# Patient Record
Sex: Female | Born: 1935
Health system: Southern US, Community
[De-identification: ages and names within clinical notes are randomized; demographics above are authoritative.]

## PROBLEM LIST (undated history)

## (undated) DIAGNOSIS — K5790 Diverticulosis of intestine, part unspecified, without perforation or abscess without bleeding: Secondary | ICD-10-CM

## (undated) DIAGNOSIS — B029 Zoster without complications: Secondary | ICD-10-CM

## (undated) DIAGNOSIS — I7300 Raynaud's syndrome without gangrene: Secondary | ICD-10-CM

## (undated) DIAGNOSIS — E7800 Pure hypercholesterolemia, unspecified: Secondary | ICD-10-CM

## (undated) DIAGNOSIS — Z9289 Personal history of other medical treatment: Secondary | ICD-10-CM

## (undated) DIAGNOSIS — M1712 Unilateral primary osteoarthritis, left knee: Secondary | ICD-10-CM

## (undated) DIAGNOSIS — Z862 Personal history of diseases of the blood and blood-forming organs and certain disorders involving the immune mechanism: Secondary | ICD-10-CM

## (undated) DIAGNOSIS — I7121 Aneurysm of the ascending aorta, without rupture: Secondary | ICD-10-CM

## (undated) DIAGNOSIS — E039 Hypothyroidism, unspecified: Secondary | ICD-10-CM

## (undated) DIAGNOSIS — I2699 Other pulmonary embolism without acute cor pulmonale: Secondary | ICD-10-CM

## (undated) DIAGNOSIS — I480 Paroxysmal atrial fibrillation: Secondary | ICD-10-CM

## (undated) DIAGNOSIS — I2510 Atherosclerotic heart disease of native coronary artery without angina pectoris: Secondary | ICD-10-CM

## (undated) DIAGNOSIS — I429 Cardiomyopathy, unspecified: Secondary | ICD-10-CM

## (undated) DIAGNOSIS — K589 Irritable bowel syndrome without diarrhea: Secondary | ICD-10-CM

## (undated) DIAGNOSIS — I82409 Acute embolism and thrombosis of unspecified deep veins of unspecified lower extremity: Secondary | ICD-10-CM

## (undated) DIAGNOSIS — I712 Thoracic aortic aneurysm, without rupture: Secondary | ICD-10-CM

## (undated) HISTORY — PX: JOINT REPLACEMENT: SHX530

## (undated) HISTORY — PX: KNEE ARTHROSCOPY: SUR90

## (undated) HISTORY — DX: Other pulmonary embolism without acute cor pulmonale: I26.99

## (undated) HISTORY — DX: Irritable bowel syndrome, unspecified: K58.9

## (undated) HISTORY — DX: Zoster without complications: B02.9

## (undated) HISTORY — DX: Unilateral primary osteoarthritis, left knee: M17.12

## (undated) HISTORY — DX: Personal history of other medical treatment: Z92.89

## (undated) HISTORY — DX: Atherosclerotic heart disease of native coronary artery without angina pectoris: I25.10

## (undated) HISTORY — DX: Cardiomyopathy, unspecified: I42.9

## (undated) HISTORY — PX: OTHER SURGICAL HISTORY: SHX169

## (undated) HISTORY — DX: Raynaud's syndrome without gangrene: I73.00

## (undated) HISTORY — DX: Paroxysmal atrial fibrillation: I48.0

## (undated) HISTORY — DX: Thoracic aortic aneurysm, without rupture: I71.2

## (undated) HISTORY — DX: Hypothyroidism, unspecified: E03.9

## (undated) HISTORY — DX: Aneurysm of the ascending aorta, without rupture: I71.21

## (undated) HISTORY — DX: Pure hypercholesterolemia, unspecified: E78.00

## (undated) HISTORY — DX: Diverticulosis of intestine, part unspecified, without perforation or abscess without bleeding: K57.90

## (undated) HISTORY — PX: APPENDECTOMY: SHX54

---

## 1959-01-19 HISTORY — PX: TONSILLECTOMY: SUR1361

## 1971-01-19 HISTORY — PX: ABDOMINAL HYSTERECTOMY: SHX81

## 1998-01-18 HISTORY — PX: SHOULDER ADHESION RELEASE: SHX773

## 2000-05-26 ENCOUNTER — Other Ambulatory Visit: Admission: RE | Admit: 2000-05-26 | Discharge: 2000-05-26 | Payer: Self-pay | Admitting: Gynecology

## 2002-11-28 ENCOUNTER — Ambulatory Visit (HOSPITAL_COMMUNITY): Admission: RE | Admit: 2002-11-28 | Discharge: 2002-11-28 | Payer: Self-pay | Admitting: Gastroenterology

## 2004-11-26 ENCOUNTER — Other Ambulatory Visit: Admission: RE | Admit: 2004-11-26 | Discharge: 2004-11-26 | Payer: Self-pay | Admitting: Gynecology

## 2008-12-18 ENCOUNTER — Encounter: Admission: RE | Admit: 2008-12-18 | Discharge: 2008-12-18 | Payer: Self-pay | Admitting: Otolaryngology

## 2008-12-20 ENCOUNTER — Encounter: Admission: RE | Admit: 2008-12-20 | Discharge: 2008-12-20 | Payer: Self-pay | Admitting: Otolaryngology

## 2010-05-20 ENCOUNTER — Other Ambulatory Visit: Payer: Self-pay | Admitting: Internal Medicine

## 2010-05-20 DIAGNOSIS — Z1231 Encounter for screening mammogram for malignant neoplasm of breast: Secondary | ICD-10-CM

## 2010-05-22 ENCOUNTER — Ambulatory Visit: Payer: Self-pay

## 2010-05-29 ENCOUNTER — Ambulatory Visit
Admission: RE | Admit: 2010-05-29 | Discharge: 2010-05-29 | Disposition: A | Discharge status: Home / Self Care | Payer: Medicare HMO | Source: Ambulatory Visit | Attending: Internal Medicine | Admitting: Internal Medicine

## 2010-05-29 DIAGNOSIS — Z1231 Encounter for screening mammogram for malignant neoplasm of breast: Secondary | ICD-10-CM

## 2010-06-05 NOTE — Op Note (Signed)
   NAME:  Whitney Medina, Whitney Medina                      ACCOUNT NO.:  [id number]   MEDICAL RECORD NO.:  [id number]                   PATIENT TYPE:  AMB   LOCATION:  ENDO                                 FACILITY:  [facility]   PHYSICIAN:  John C. Madilyn Fireman, M.D.                 DATE OF BIRTH:  1932-04-27   DATE OF PROCEDURE:  11/28/2002  DATE OF DISCHARGE:                                 OPERATIVE REPORT   PROCEDURE:  Colonoscopy.   INDICATIONS FOR PROCEDURE:  Average risk colon cancer screening in a 77-year-  old patient with no recent screening.   DESCRIPTION OF PROCEDURE:  The patient was placed in the left lateral  decubitus position then placed on the pulse monitor with continuous low flow  oxygen delivered by nasal cannula. She was sedated with 75 mcg IV fentanyl  and 7 mg IV Versed. The Olympus video colonoscope was inserted into the  rectum and advanced to the cecum, confirmed by transillumination at  McBurney's point and visualization of the ileocecal valve and appendiceal  orifice. The prep was excellent. The cecum, ascending, transverse,  descending and sigmoid colon all appeared normal with no masses, polyps,  diverticula or other mucosal abnormalities. The rectum likewise appeared  normal and retroflexed view of the anus revealed no obvious internal  hemorrhoids. The scope was then withdrawn and the patient returned to the  recovery room in stable condition. The patient tolerated the procedure well  and there were no immediate complications.   IMPRESSION:  Normal colonoscopy.   PLAN:  Repeat study in five years.                                               John C. Madilyn Fireman, M.D.    J[facility]/MEDQ  D:  11/28/2002  T:  11/28/2002  Job:  [id number]   cc:   Larina Earthly, M.D.  [address]  [place]  [place] [address]  Fax: [phone]

## 2010-11-03 ENCOUNTER — Other Ambulatory Visit (HOSPITAL_COMMUNITY): Payer: Self-pay | Admitting: Internal Medicine

## 2010-11-03 DIAGNOSIS — R070 Pain in throat: Secondary | ICD-10-CM

## 2010-11-06 ENCOUNTER — Ambulatory Visit (HOSPITAL_COMMUNITY)
Admission: RE | Admit: 2010-11-06 | Discharge: 2010-11-06 | Disposition: A | Discharge status: Home / Self Care | Payer: Medicare HMO | Source: Ambulatory Visit | Attending: Internal Medicine | Admitting: Internal Medicine

## 2010-11-06 DIAGNOSIS — R059 Cough, unspecified: Secondary | ICD-10-CM | POA: Insufficient documentation

## 2010-11-06 DIAGNOSIS — R070 Pain in throat: Secondary | ICD-10-CM | POA: Insufficient documentation

## 2010-11-06 DIAGNOSIS — R05 Cough: Secondary | ICD-10-CM | POA: Insufficient documentation

## 2012-08-16 ENCOUNTER — Ambulatory Visit: Payer: Self-pay | Admitting: Orthopedic Surgery

## 2012-10-31 ENCOUNTER — Other Ambulatory Visit: Payer: Self-pay | Admitting: Orthopedic Surgery

## 2012-10-31 DIAGNOSIS — M25562 Pain in left knee: Secondary | ICD-10-CM

## 2012-11-06 ENCOUNTER — Ambulatory Visit
Admission: RE | Admit: 2012-11-06 | Discharge: 2012-11-06 | Disposition: A | Discharge status: Home / Self Care | Payer: Commercial Managed Care - HMO | Source: Ambulatory Visit | Attending: Orthopedic Surgery | Admitting: Orthopedic Surgery

## 2012-11-06 DIAGNOSIS — M25562 Pain in left knee: Secondary | ICD-10-CM

## 2012-11-08 ENCOUNTER — Other Ambulatory Visit: Payer: Medicare HMO

## 2012-12-11 ENCOUNTER — Encounter (HOSPITAL_COMMUNITY): Payer: Self-pay | Admitting: Pharmacy Technician

## 2012-12-12 ENCOUNTER — Encounter: Payer: Self-pay | Admitting: Physician Assistant

## 2012-12-12 ENCOUNTER — Other Ambulatory Visit: Payer: Self-pay | Admitting: Physician Assistant

## 2012-12-12 DIAGNOSIS — E039 Hypothyroidism, unspecified: Secondary | ICD-10-CM | POA: Insufficient documentation

## 2012-12-12 DIAGNOSIS — E7800 Pure hypercholesterolemia, unspecified: Secondary | ICD-10-CM | POA: Insufficient documentation

## 2012-12-12 DIAGNOSIS — M1712 Unilateral primary osteoarthritis, left knee: Secondary | ICD-10-CM | POA: Insufficient documentation

## 2012-12-12 NOTE — H&P (Signed)
TOTAL KNEE ADMISSION H&P  Patient is being admitted for left total knee arthroplasty.  Subjective:  Chief Complaint:left knee pain.  HPI: Whitney Medina, 80 y.o. female, has a history of pain and functional disability in the left knee due to trauma and arthritis.  Patient fell down 7 steps at a [employer] while attending a wedding 06/10/2012.  She has failed non-surgical conservative treatments for greater than 12 weeks to includeNSAID's and/or analgesics, corticosteriod injections, viscosupplementation injections, flexibility and strengthening excercises, supervised PT with diminished ADL's post treatment, use of assistive devices and activity modification.  Onset of symptoms was abrupt, starting 1 years ago with rapidlly worsening course since that time. The patient noted prior procedures on the knee to include  arthroscopy and menisectomy on the left knee(s).  Patient currently rates pain in the left knee(s) at 10 out of 10 with activity. Patient has night pain, worsening of pain with activity and weight bearing, pain that interferes with activities of daily living, crepitus and joint swelling.  Patient has evidence of subchondral sclerosis, periarticular osteophytes and joint space narrowing by imaging studies.  There is no active infection.  Patient Active Problem List   Diagnosis Date Noted  . Hypothyroidism   . Hypercholesteremia   . Left knee DJD    Past Medical History  Diagnosis Date  . Hypothyroidism   . Hypercholesteremia   . Left knee DJD     Past Surgical History  Procedure Laterality Date  . Abdominal hysterectomy  1973  . Tonsillectomy  1961  . Shoulder adhesion release Left 2000     (Not in a hospital admission) Allergies  Allergen Reactions  . Codeine Nausea And Vomiting    Percocet OK    Current Outpatient Prescriptions on File Prior to Visit  Medication Sig Dispense Refill  . Apple Cider Vinegar 600 MG CAPS Take 1 capsule by mouth daily.      . aspirin 325 MG  EC tablet Take 325 mg by mouth daily.      . atorvastatin (LIPITOR) 10 MG tablet Take 10 mg by mouth daily.      . Calcium Carb-Cholecalciferol (CALCIUM 1000 + D PO) Take 1 tablet by mouth daily.      . Capsicum, Cayenne, 455 MG CAPS Take 1 capsule by mouth daily.      . Cholecalciferol (VITAMIN D) 2000 UNITS CAPS Take 1 capsule by mouth daily.      . Cinnamon 500 MG TABS Take 500 mg by mouth daily.      . Flaxseed, Linseed, (FLAXSEED OIL) 1000 MG CAPS Take 1 capsule by mouth 2 (two) times daily.      . Glucosamine-Chondroitin-MSM 500-400-125 MG TABS Take 2 tablets by mouth daily.      . levothyroxine (SYNTHROID, LEVOTHROID) 100 MCG tablet Take 100 mcg by mouth daily before breakfast.      . loratadine (CLARITIN) 10 MG tablet Take 10 mg by mouth daily.      . meloxicam (MOBIC) 15 MG tablet Take 15 mg by mouth every other day.      . Multiple Vitamins-Minerals (MULTIVITAMIN WITH MINERALS) tablet Take 1 tablet by mouth daily.      . Omega-3 Fatty Acids (FISH OIL) 1000 MG CAPS Take 1 capsule by mouth 2 (two) times daily.      . OVER THE COUNTER MEDICATION Take 1 tablet by mouth daily. Greens Source      . vitamin C (ASCORBIC ACID) 500 MG tablet Take 250 mg by mouth daily.         No current facility-administered medications on file prior to visit.   History  Substance Use Topics  . Smoking status: Never Smoker   . Smokeless tobacco: Not on file  . Alcohol Use: No    Family History  Problem Relation Age of Onset  . Cancer - Other Mother   . Melanoma Sister   . Heart disease Sister      Review of Systems  Constitutional: Negative.   HENT: Negative.   Eyes: Negative.   Cardiovascular: Negative.   Gastrointestinal: Positive for constipation. Negative for heartburn, nausea, vomiting, abdominal pain, diarrhea, blood in stool and melena.  Genitourinary: Negative.   Musculoskeletal: Positive for joint pain. Negative for back pain, falls, myalgias and neck pain.       Left knee  Skin:  Negative.   Neurological: Negative.   Endo/Heme/Allergies: Negative.   Psychiatric/Behavioral: Negative.     Objective:  Physical Exam  Constitutional: She is oriented to person, place, and time. She appears well-developed and well-nourished.  HENT:  Head: Normocephalic and atraumatic.  Mouth/Throat: Oropharynx is clear and moist.  Eyes: Conjunctivae and EOM are normal. Pupils are equal, round, and reactive to light.  Neck: Normal range of motion. Neck supple.  Cardiovascular: Normal rate, regular rhythm and normal heart sounds.   Respiratory: Effort normal and breath sounds normal.  GI: Soft. Bowel sounds are normal.  Genitourinary:  Not pertinent to current symptomatology therefore not examined.  Musculoskeletal:  Examination of her left knee reveals pain medially and laterally, 1+ crepitation 1+ synovitis range of motion -5 to 125 degrees, knee is stable with diffuse pain and normal patella tracking. Exam of her right knee reveals full range of motion without pain swelling weakness or instability. Vascular exam: pulses 2+ and symmetric.  Neurological: She is alert and oriented to person, place, and time.  Skin: Skin is warm and dry.  Psychiatric: She has a normal mood and affect. Her behavior is normal.    Vital signs in last 24 hours: Last recorded: 11/25 1300   BP: 124/78 Pulse: 82  Temp: 97.6 F (36.4 C)    Height: 5' 9" (1.753 m) SpO2: 96  Weight: 75.297 kg (166 lb)     Labs:   Estimated body mass index is 24.5 kg/(m^2) as calculated from the following:   Height as of this encounter: 5' 9" (1.753 m).   Weight as of this encounter: 75.297 kg (166 lb).   Imaging Review Plain radiographs demonstrate severe degenerative joint disease of the left knee(s). The overall alignment ismild valgus. The bone quality appears to be good for age and reported activity level.  Assessment/Plan:  End stage arthritis, left knee   The patient history, physical examination,  clinical judgment of the provider and imaging studies are consistent with end stage degenerative joint disease of the left knee(s) and total knee arthroplasty is deemed medically necessary. The treatment options including medical management, injection therapy arthroscopy and arthroplasty were discussed at length. The risks and benefits of total knee arthroplasty were presented and reviewed. The risks due to aseptic loosening, infection, stiffness, patella tracking problems, thromboembolic complications and other imponderables were discussed. The patient acknowledged the explanation, agreed to proceed with the plan and consent was signed. Patient is being admitted for inpatient treatment for surgery, pain control, PT, OT, prophylactic antibiotics, VTE prophylaxis, progressive ambulation and ADL's and discharge planning. The patient is planning to be discharged to skilled nursing facility [facility]  Rochell Puett A. Giorgio Chabot, PA-C Physician Assistant [facility] [phone]    12/12/2012, 2:09 PM   

## 2012-12-16 NOTE — Pre-Procedure Instructions (Signed)
Whitney Medina  12/16/2012   Your procedure is scheduled on:  December 8  Report to [facility]tr[facility] "A" [address] at [employer] AM.  Call this number if you have problems the morning of surgery: [phone]   Remember:   Do not eat food or drink liquids after midnight.   Take these medicines the morning of surgery with A SIP OF WATER: Levothyroxine, Claritin,    STOP Apple Cider Vinegar Tabs, Aspirin, Calcium with Vitamin D, Capsicum, Vitamin D, Cinnamon, Flaxseed, Glucosamine, Meloxicam, Multiple Vitamins, Fish Oil, Greens Source, and Vitamin C today   STOP/ Do not take Aspirin, Aleve, Naproxen, Advil, Ibuprofen, Vitamin, Herbs, and Supplements starting today     Do not wear jewelry, make-up or nail polish.  Do not wear lotions, powders, or perfumes. You may wear deodorant.  Do not shave 48 hours prior to surgery. Men may shave face and neck.  Do not bring valuables to the hospital.  [facility] is not responsible                  for any belongings or valuables.               Contacts, dentures or bridgework may not be worn into surgery.  Leave suitcase in the car. After surgery it may be brought to your room.  For patients admitted to the hospital, discharge time is determined by your                treatment team.               Special Instructions: Shower using CHG 2 nights before surgery and the night before surgery.  If you shower the day of surgery use CHG.  Use special wash - you have one bottle of CHG for all showers.  You should use approximately 1/3 of the bottle for each shower.   Please read over the following fact sheets that you were given: Pain Booklet, Coughing and Deep Breathing, Blood Transfusion Information, Total Joint Packet and Surgical Site Infection Prevention

## 2012-12-18 ENCOUNTER — Encounter (HOSPITAL_COMMUNITY)
Admission: RE | Admit: 2012-12-18 | Discharge: 2012-12-18 | Disposition: A | Discharge status: Home / Self Care | Payer: Medicare HMO | Source: Ambulatory Visit | Attending: Orthopedic Surgery | Admitting: Orthopedic Surgery

## 2012-12-18 ENCOUNTER — Ambulatory Visit (HOSPITAL_COMMUNITY)
Admission: RE | Admit: 2012-12-18 | Discharge: 2012-12-18 | Disposition: A | Discharge status: Home / Self Care | Payer: Medicare HMO | Source: Ambulatory Visit | Attending: Physician Assistant | Admitting: Physician Assistant

## 2012-12-18 ENCOUNTER — Encounter (HOSPITAL_COMMUNITY): Payer: Self-pay

## 2012-12-18 DIAGNOSIS — R9431 Abnormal electrocardiogram [ECG] [EKG]: Secondary | ICD-10-CM | POA: Insufficient documentation

## 2012-12-18 DIAGNOSIS — Z01818 Encounter for other preprocedural examination: Secondary | ICD-10-CM | POA: Insufficient documentation

## 2012-12-18 DIAGNOSIS — Z01810 Encounter for preprocedural cardiovascular examination: Secondary | ICD-10-CM | POA: Insufficient documentation

## 2012-12-18 HISTORY — DX: Personal history of diseases of the blood and blood-forming organs and certain disorders involving the immune mechanism: Z86.2

## 2012-12-18 LAB — TYPE AND SCREEN: Antibody Screen: NEGATIVE

## 2012-12-18 LAB — PROTIME-INR: Prothrombin Time: 12.4 seconds (ref 11.6–15.2)

## 2012-12-18 LAB — CBC WITH DIFFERENTIAL/PLATELET
Basophils Absolute: 0 10*3/uL (ref 0.0–0.1)
Eosinophils Absolute: 0 10*3/uL (ref 0.0–0.7)
Lymphocytes Relative: 32 % (ref 12–46)
Lymphs Abs: 1.7 10*3/uL (ref 0.7–4.0)
Neutrophils Relative %: 59 % (ref 43–77)
Platelets: 190 10*3/uL (ref 150–400)
RBC: 4.68 MIL/uL (ref 3.87–5.11)
WBC: 5.3 10*3/uL (ref 4.0–10.5)

## 2012-12-18 LAB — COMPREHENSIVE METABOLIC PANEL
ALT: 17 U/L (ref 0–35)
AST: 23 U/L (ref 0–37)
Alkaline Phosphatase: 76 U/L (ref 39–117)
BUN: 13 mg/dL (ref 6–23)
CO2: 30 mEq/L (ref 19–32)
Chloride: 102 mEq/L (ref 96–112)
GFR calc Af Amer: >90 mL/min (ref >90)
GFR calc non Af Amer: 79 mL/min — ABNORMAL LOW (ref >90)
Glucose, Bld: 98 mg/dL (ref 70–99)
Potassium: 4.9 mEq/L (ref 3.5–5.1)
Sodium: 142 mEq/L (ref 135–145)
Total Protein: 7.4 g/dL (ref 6.0–8.3)

## 2012-12-18 LAB — URINE MICROSCOPIC-ADD ON

## 2012-12-18 LAB — URINALYSIS, ROUTINE W REFLEX MICROSCOPIC
Bilirubin Urine: NEGATIVE
Hgb urine dipstick: NEGATIVE
Ketones, ur: NEGATIVE mg/dL
Nitrite: NEGATIVE
Protein, ur: NEGATIVE mg/dL
Specific Gravity, Urine: 1.018 (ref 1.005–1.030)
Urobilinogen, UA: 0.2 mg/dL (ref 0.0–1.0)

## 2012-12-18 LAB — ABO/RH: ABO/RH(D): O POS

## 2012-12-18 NOTE — Progress Notes (Addendum)
Anesthesia Chart Review:  Patient is a 79 year old female scheduled for left TKR on 12/25/12 by Dr. Thurston Hole.  History includes non-smoker, hypothyroidism, hypercholesterolemia, anemia, hysterectomy, tonsillectomy, post-operative N/V.  PCP is listed as Dr. Chilton Greathouse.    EKG on 12/18/12 showed NSR, non-specific ST abnormality.  CXR report on 12/18/12 showed: There is a 5 mm calcified right lower lobe pulmonary nodule likely reflecting sequela of prior granulomatous disease. There is no focal parenchymal opacity, pleural effusion, or pneumothorax. The heart  and mediastinal contours are unremarkable. The osseous structures are unremarkable. IMPRESSION: No active cardiopulmonary disease.  Preoperative labs noted.  Urine culture is pending. (Update: 12/20/12 1:55 PM [place], PA-C is aware of urine culture results.  Patient started on Cipro while awaiting sensitivities.)  Anticipate that she can proceed as planned from an anesthesia standpoint.  Velna Ochs [facility] Short Stay Center/Anesthesiology Phone [phone] 12/18/2012 3:17 PM

## 2012-12-21 LAB — URINE CULTURE: Colony Count: >=100000

## 2012-12-24 MED ORDER — TRANEXAMIC ACID 100 MG/ML IV SOLN
1000.0000 mg | INTRAVENOUS | Status: AC
  Administered 2012-12-25: 1000 mg via INTRAVENOUS
  Filled 2012-12-24: qty 10

## 2012-12-24 MED ORDER — CEFAZOLIN SODIUM-DEXTROSE 2-3 GM-% IV SOLR
2.0000 g | INTRAVENOUS | Status: AC
  Administered 2012-12-25: 2 g via INTRAVENOUS
  Filled 2012-12-24: qty 50

## 2012-12-25 ENCOUNTER — Encounter (HOSPITAL_COMMUNITY): Payer: Medicare HMO | Admitting: Vascular Surgery

## 2012-12-25 ENCOUNTER — Encounter (HOSPITAL_COMMUNITY): Payer: Self-pay | Admitting: *Deleted

## 2012-12-25 ENCOUNTER — Encounter (HOSPITAL_COMMUNITY)
Admission: RE | Disposition: A | Discharge status: Skilled Nursing Facility | Payer: Self-pay | Source: Ambulatory Visit | Attending: Orthopedic Surgery

## 2012-12-25 ENCOUNTER — Inpatient Hospital Stay (HOSPITAL_COMMUNITY)
Admission: RE | Admit: 2012-12-25 | Discharge: 2012-12-27 | DRG: 470 | Disposition: A | Discharge status: Skilled Nursing Facility | Payer: Medicare HMO | Source: Ambulatory Visit | Attending: Orthopedic Surgery | Admitting: Orthopedic Surgery

## 2012-12-25 ENCOUNTER — Inpatient Hospital Stay (HOSPITAL_COMMUNITY): Payer: Medicare HMO | Admitting: Anesthesiology

## 2012-12-25 DIAGNOSIS — M179 Osteoarthritis of knee, unspecified: Secondary | ICD-10-CM | POA: Diagnosis present

## 2012-12-25 DIAGNOSIS — K5900 Constipation, unspecified: Secondary | ICD-10-CM | POA: Diagnosis present

## 2012-12-25 DIAGNOSIS — E039 Hypothyroidism, unspecified: Secondary | ICD-10-CM | POA: Diagnosis present

## 2012-12-25 DIAGNOSIS — Z8249 Family history of ischemic heart disease and other diseases of the circulatory system: Secondary | ICD-10-CM

## 2012-12-25 DIAGNOSIS — M1710 Unilateral primary osteoarthritis, unspecified knee: Secondary | ICD-10-CM | POA: Diagnosis present

## 2012-12-25 DIAGNOSIS — Z808 Family history of malignant neoplasm of other organs or systems: Secondary | ICD-10-CM

## 2012-12-25 DIAGNOSIS — M1712 Unilateral primary osteoarthritis, left knee: Secondary | ICD-10-CM | POA: Diagnosis present

## 2012-12-25 DIAGNOSIS — E875 Hyperkalemia: Secondary | ICD-10-CM | POA: Diagnosis present

## 2012-12-25 DIAGNOSIS — Z79899 Other long term (current) drug therapy: Secondary | ICD-10-CM

## 2012-12-25 DIAGNOSIS — E7800 Pure hypercholesterolemia, unspecified: Secondary | ICD-10-CM | POA: Diagnosis present

## 2012-12-25 DIAGNOSIS — Z7982 Long term (current) use of aspirin: Secondary | ICD-10-CM

## 2012-12-25 HISTORY — PX: TOTAL KNEE ARTHROPLASTY: SHX125

## 2012-12-25 SURGERY — ARTHROPLASTY, KNEE, TOTAL
Anesthesia: General | Site: Knee | Laterality: Left

## 2012-12-25 MED ORDER — DIPHENHYDRAMINE HCL 12.5 MG/5ML PO ELIX: 12.5000 mg | ORAL_SOLUTION | ORAL | Status: DC | PRN

## 2012-12-25 MED ORDER — HYDROMORPHONE HCL PF 1 MG/ML IJ SOLN
1.0000 mg | INTRAMUSCULAR | Status: DC | PRN
  Administered 2012-12-25 – 2012-12-26 (×3): 0.5 mg via INTRAVENOUS
  Filled 2012-12-25 (×2): qty 1

## 2012-12-25 MED ORDER — OXYCODONE HCL 5 MG/5ML PO SOLN: 5.0000 mg | Freq: Once | ORAL | Status: AC | PRN

## 2012-12-25 MED ORDER — ZOLPIDEM TARTRATE 5 MG PO TABS: 5.0000 mg | ORAL_TABLET | Freq: Every evening | ORAL | Status: DC | PRN

## 2012-12-25 MED ORDER — DEXAMETHASONE SODIUM PHOSPHATE 10 MG/ML IJ SOLN
10.0000 mg | Freq: Three times a day (TID) | INTRAMUSCULAR | Status: AC
  Administered 2012-12-26: 10 mg via INTRAVENOUS
  Filled 2012-12-25 (×3): qty 1

## 2012-12-25 MED ORDER — VITAMIN D 50 MCG (2000 UT) PO CAPS: 1.0000 | ORAL_CAPSULE | Freq: Every day | ORAL | Status: DC

## 2012-12-25 MED ORDER — FENTANYL CITRATE 0.05 MG/ML IJ SOLN
INTRAMUSCULAR | Status: DC | PRN
  Administered 2012-12-25: 25 ug via INTRAVENOUS
  Administered 2012-12-25: 50 ug via INTRAVENOUS
  Administered 2012-12-25: 25 ug via INTRAVENOUS

## 2012-12-25 MED ORDER — MIDAZOLAM HCL 5 MG/5ML IJ SOLN
INTRAMUSCULAR | Status: DC | PRN
  Administered 2012-12-25: 1 mg via INTRAVENOUS

## 2012-12-25 MED ORDER — OXYCODONE HCL 5 MG PO TABS
5.0000 mg | ORAL_TABLET | Freq: Once | ORAL | Status: AC | PRN
  Administered 2012-12-25: 5 mg via ORAL

## 2012-12-25 MED ORDER — BUPIVACAINE-EPINEPHRINE 0.25% -1:200000 IJ SOLN
INTRAMUSCULAR | Status: DC | PRN
  Administered 2012-12-25: 30 mL

## 2012-12-25 MED ORDER — ONDANSETRON HCL 4 MG/2ML IJ SOLN
4.0000 mg | Freq: Four times a day (QID) | INTRAMUSCULAR | Status: DC | PRN
  Administered 2012-12-26: 4 mg via INTRAVENOUS
  Filled 2012-12-25: qty 2

## 2012-12-25 MED ORDER — OXYCODONE HCL 5 MG PO TABS
5.0000 mg | ORAL_TABLET | ORAL | Status: DC | PRN
  Administered 2012-12-25 – 2012-12-26 (×4): 5 mg via ORAL
  Administered 2012-12-26 – 2012-12-27 (×7): 10 mg via ORAL
  Filled 2012-12-25 (×3): qty 2
  Filled 2012-12-25: qty 1
  Filled 2012-12-25 (×3): qty 2
  Filled 2012-12-25: qty 1
  Filled 2012-12-25: qty 2
  Filled 2012-12-25: qty 1
  Filled 2012-12-25: qty 2

## 2012-12-25 MED ORDER — ARTIFICIAL TEARS OP OINT
TOPICAL_OINTMENT | OPHTHALMIC | Status: DC | PRN
  Administered 2012-12-25: 1 via OPHTHALMIC

## 2012-12-25 MED ORDER — DEXAMETHASONE 6 MG PO TABS
10.0000 mg | ORAL_TABLET | Freq: Three times a day (TID) | ORAL | Status: AC
  Administered 2012-12-25 (×2): 10 mg via ORAL
  Filled 2012-12-25 (×3): qty 1

## 2012-12-25 MED ORDER — SODIUM CHLORIDE 0.9 % IR SOLN
Status: DC | PRN
  Administered 2012-12-25: 3000 mL

## 2012-12-25 MED ORDER — POVIDONE-IODINE 7.5 % EX SOLN
Freq: Once | CUTANEOUS | Status: DC
  Filled 2012-12-25: qty 118

## 2012-12-25 MED ORDER — HYDROMORPHONE HCL PF 1 MG/ML IJ SOLN
INTRAMUSCULAR | Status: AC
  Filled 2012-12-25: qty 1

## 2012-12-25 MED ORDER — LEVOTHYROXINE SODIUM 100 MCG PO TABS
100.0000 ug | ORAL_TABLET | Freq: Every day | ORAL | Status: DC
  Administered 2012-12-26 – 2012-12-27 (×2): 100 ug via ORAL
  Filled 2012-12-25 (×3): qty 1

## 2012-12-25 MED ORDER — LIDOCAINE HCL (CARDIAC) 20 MG/ML IV SOLN
INTRAVENOUS | Status: DC | PRN
  Administered 2012-12-25: 50 mg via INTRAVENOUS

## 2012-12-25 MED ORDER — PHENOL 1.4 % MT LIQD: 1.0000 | OROMUCOSAL | Status: DC | PRN

## 2012-12-25 MED ORDER — ONDANSETRON HCL 4 MG/2ML IJ SOLN
INTRAMUSCULAR | Status: DC | PRN
  Administered 2012-12-25: 4 mg via INTRAVENOUS

## 2012-12-25 MED ORDER — ALUM & MAG HYDROXIDE-SIMETH 200-200-20 MG/5ML PO SUSP: 30.0000 mL | ORAL | Status: DC | PRN

## 2012-12-25 MED ORDER — VITAMIN C 250 MG PO TABS
250.0000 mg | ORAL_TABLET | Freq: Every day | ORAL | Status: DC
  Administered 2012-12-26 – 2012-12-27 (×2): 250 mg via ORAL
  Filled 2012-12-25 (×3): qty 1

## 2012-12-25 MED ORDER — ACETAMINOPHEN 650 MG RE SUPP: 650.0000 mg | Freq: Four times a day (QID) | RECTAL | Status: DC | PRN

## 2012-12-25 MED ORDER — ACETAMINOPHEN 325 MG PO TABS
650.0000 mg | ORAL_TABLET | Freq: Four times a day (QID) | ORAL | Status: DC | PRN
  Administered 2012-12-27: 650 mg via ORAL
  Filled 2012-12-25: qty 2

## 2012-12-25 MED ORDER — LACTATED RINGERS IV SOLN: INTRAVENOUS | Status: DC

## 2012-12-25 MED ORDER — LACTATED RINGERS IV SOLN
INTRAVENOUS | Status: DC | PRN
  Administered 2012-12-25: 07:00:00 via INTRAVENOUS

## 2012-12-25 MED ORDER — MENTHOL 3 MG MT LOZG: 1.0000 | LOZENGE | OROMUCOSAL | Status: DC | PRN

## 2012-12-25 MED ORDER — VITAMIN D3 25 MCG (1000 UNIT) PO TABS
2000.0000 [IU] | ORAL_TABLET | Freq: Every day | ORAL | Status: DC
  Administered 2012-12-25 – 2012-12-27 (×3): 2000 [IU] via ORAL
  Filled 2012-12-25 (×3): qty 2

## 2012-12-25 MED ORDER — METOCLOPRAMIDE HCL 10 MG PO TABS: 5.0000 mg | ORAL_TABLET | Freq: Three times a day (TID) | ORAL | Status: DC | PRN

## 2012-12-25 MED ORDER — POTASSIUM CHLORIDE IN NACL 20-0.9 MEQ/L-% IV SOLN
INTRAVENOUS | Status: DC
  Administered 2012-12-25: 15:00:00 via INTRAVENOUS
  Filled 2012-12-25 (×3): qty 1000

## 2012-12-25 MED ORDER — BUPIVACAINE-EPINEPHRINE (PF) 0.25% -1:200000 IJ SOLN
INTRAMUSCULAR | Status: AC
  Filled 2012-12-25: qty 30

## 2012-12-25 MED ORDER — DOCUSATE SODIUM 100 MG PO CAPS
100.0000 mg | ORAL_CAPSULE | Freq: Two times a day (BID) | ORAL | Status: DC
  Administered 2012-12-25 – 2012-12-27 (×5): 100 mg via ORAL
  Filled 2012-12-25 (×5): qty 1

## 2012-12-25 MED ORDER — CALCIUM CARBONATE-VITAMIN D 500-200 MG-UNIT PO TABS
1.0000 | ORAL_TABLET | Freq: Two times a day (BID) | ORAL | Status: DC
  Administered 2012-12-25 – 2012-12-27 (×5): 1 via ORAL
  Filled 2012-12-25 (×6): qty 1

## 2012-12-25 MED ORDER — CEFAZOLIN SODIUM 1-5 GM-% IV SOLN
1.0000 g | Freq: Four times a day (QID) | INTRAVENOUS | Status: AC
  Administered 2012-12-25 (×2): 1 g via INTRAVENOUS
  Filled 2012-12-25 (×2): qty 50

## 2012-12-25 MED ORDER — HYDROMORPHONE HCL PF 1 MG/ML IJ SOLN
0.2500 mg | INTRAMUSCULAR | Status: DC | PRN
  Administered 2012-12-25 (×4): 0.5 mg via INTRAVENOUS

## 2012-12-25 MED ORDER — CELECOXIB 200 MG PO CAPS
200.0000 mg | ORAL_CAPSULE | Freq: Two times a day (BID) | ORAL | Status: DC
  Administered 2012-12-25 (×2): 200 mg via ORAL
  Filled 2012-12-25 (×5): qty 1

## 2012-12-25 MED ORDER — BUPIVACAINE-EPINEPHRINE PF 0.5-1:200000 % IJ SOLN
INTRAMUSCULAR | Status: DC | PRN
  Administered 2012-12-25: 20 mL via PERINEURAL

## 2012-12-25 MED ORDER — OXYCODONE HCL 5 MG PO TABS
ORAL_TABLET | ORAL | Status: AC
  Administered 2012-12-25: 5 mg
  Filled 2012-12-25: qty 1

## 2012-12-25 MED ORDER — DEXAMETHASONE SODIUM PHOSPHATE 4 MG/ML IJ SOLN
INTRAMUSCULAR | Status: DC | PRN
  Administered 2012-12-25: 10 mg via INTRAVENOUS

## 2012-12-25 MED ORDER — PROPOFOL 10 MG/ML IV BOLUS
INTRAVENOUS | Status: DC | PRN
  Administered 2012-12-25: 150 mg via INTRAVENOUS

## 2012-12-25 MED ORDER — ACETAMINOPHEN 500 MG PO TABS
1000.0000 mg | ORAL_TABLET | Freq: Four times a day (QID) | ORAL | Status: AC
  Administered 2012-12-25 – 2012-12-26 (×4): 1000 mg via ORAL
  Filled 2012-12-25 (×4): qty 2

## 2012-12-25 MED ORDER — BISACODYL 5 MG PO TBEC
10.0000 mg | DELAYED_RELEASE_TABLET | Freq: Every day | ORAL | Status: DC
  Administered 2012-12-25 – 2012-12-26 (×2): 10 mg via ORAL
  Filled 2012-12-25 (×2): qty 2

## 2012-12-25 MED ORDER — GLYCOPYRROLATE 0.2 MG/ML IJ SOLN
INTRAMUSCULAR | Status: DC | PRN
  Administered 2012-12-25: 0.1 mg via INTRAVENOUS

## 2012-12-25 MED ORDER — ASPIRIN EC 325 MG PO TBEC
325.0000 mg | DELAYED_RELEASE_TABLET | Freq: Every day | ORAL | Status: DC
  Administered 2012-12-26 – 2012-12-27 (×2): 325 mg via ORAL
  Filled 2012-12-25 (×3): qty 1

## 2012-12-25 MED ORDER — ATORVASTATIN CALCIUM 10 MG PO TABS
10.0000 mg | ORAL_TABLET | Freq: Every day | ORAL | Status: DC
  Administered 2012-12-25 – 2012-12-26 (×2): 10 mg via ORAL
  Filled 2012-12-25 (×6): qty 1

## 2012-12-25 MED ORDER — CALCIUM CARB-CHOLECALCIFEROL 1000-800 MG-UNIT PO TABS: 1000.0000 mg | ORAL_TABLET | Freq: Every day | ORAL | Status: DC

## 2012-12-25 MED ORDER — PROMETHAZINE HCL 25 MG/ML IJ SOLN: 6.2500 mg | INTRAMUSCULAR | Status: DC | PRN

## 2012-12-25 MED ORDER — LORATADINE 10 MG PO TABS
10.0000 mg | ORAL_TABLET | Freq: Every day | ORAL | Status: DC
  Administered 2012-12-26 – 2012-12-27 (×2): 10 mg via ORAL
  Filled 2012-12-25 (×2): qty 1

## 2012-12-25 MED ORDER — METOCLOPRAMIDE HCL 5 MG/ML IJ SOLN: 5.0000 mg | Freq: Three times a day (TID) | INTRAMUSCULAR | Status: DC | PRN

## 2012-12-25 MED ORDER — CHLORHEXIDINE GLUCONATE 4 % EX LIQD: 60.0000 mL | Freq: Once | CUTANEOUS | Status: DC

## 2012-12-25 MED ORDER — ONDANSETRON HCL 4 MG PO TABS: 4.0000 mg | ORAL_TABLET | Freq: Four times a day (QID) | ORAL | Status: DC | PRN

## 2012-12-25 MED ORDER — 0.9 % SODIUM CHLORIDE (POUR BTL) OPTIME
TOPICAL | Status: DC | PRN
  Administered 2012-12-25: 1000 mL

## 2012-12-25 SURGICAL SUPPLY — 71 items
BANDAGE ESMARK 6X9 LF (GAUZE/BANDAGES/DRESSINGS) ×1 IMPLANT
BLADE SAGITTAL 25.0X1.19X90 (BLADE) ×2 IMPLANT
BLADE SAW SGTL 11.0X1.19X90.0M (BLADE) IMPLANT
BLADE SAW SGTL 13.0X1.19X90.0M (BLADE) ×2 IMPLANT
BLADE SURG 10 STRL SS (BLADE) ×4 IMPLANT
BNDG CMPR 9X6 STRL LF SNTH (GAUZE/BANDAGES/DRESSINGS) ×1
BNDG CMPR MED 15X6 ELC VLCR LF (GAUZE/BANDAGES/DRESSINGS) ×1
BNDG ELASTIC 6X15 VLCR STRL LF (GAUZE/BANDAGES/DRESSINGS) ×2 IMPLANT
BNDG ESMARK 6X9 LF (GAUZE/BANDAGES/DRESSINGS) ×2
BOWL SMART MIX CTS (DISPOSABLE) ×2 IMPLANT
CAPT RP KNEE ×1 IMPLANT
CEMENT HV SMART SET (Cement) ×4 IMPLANT
CLOTH BEACON ORANGE TIMEOUT ST (SAFETY) ×1 IMPLANT
CLSR STERI-STRIP ANTIMIC 1/2X4 (GAUZE/BANDAGES/DRESSINGS) ×1 IMPLANT
COVER SURGICAL LIGHT HANDLE (MISCELLANEOUS) ×2 IMPLANT
CUFF TOURNIQUET SINGLE 34IN LL (TOURNIQUET CUFF) ×2 IMPLANT
CUFF TOURNIQUET SINGLE 44IN (TOURNIQUET CUFF) IMPLANT
DRAPE EXTREMITY T 121X128X90 (DRAPE) ×2 IMPLANT
DRAPE INCISE IOBAN 66X45 STRL (DRAPES) ×2 IMPLANT
DRAPE PROXIMA HALF (DRAPES) ×2 IMPLANT
DRAPE U-SHAPE 47X51 STRL (DRAPES) ×2 IMPLANT
DRSG ADAPTIC 3X8 NADH LF (GAUZE/BANDAGES/DRESSINGS) ×2 IMPLANT
DRSG PAD ABDOMINAL 8X10 ST (GAUZE/BANDAGES/DRESSINGS) ×4 IMPLANT
DURAPREP 26ML APPLICATOR (WOUND CARE) ×2 IMPLANT
ELECT CAUTERY BLADE 6.4 (BLADE) ×2 IMPLANT
ELECT REM PT RETURN 9FT ADLT (ELECTROSURGICAL) ×2
ELECTRODE REM PT RTRN 9FT ADLT (ELECTROSURGICAL) ×1 IMPLANT
EVACUATOR 1/8 PVC DRAIN (DRAIN) ×2 IMPLANT
FACESHIELD LNG OPTICON STERILE (SAFETY) ×2 IMPLANT
GLOVE BIO SURGEON STRL SZ7 (GLOVE) ×2 IMPLANT
GLOVE BIO SURGEON STRL SZ8 (GLOVE) ×1 IMPLANT
GLOVE BIOGEL PI IND STRL 7.0 (GLOVE) ×1 IMPLANT
GLOVE BIOGEL PI IND STRL 7.5 (GLOVE) ×1 IMPLANT
GLOVE BIOGEL PI INDICATOR 7.0 (GLOVE) ×1
GLOVE BIOGEL PI INDICATOR 7.5 (GLOVE) ×2
GLOVE BIOGEL PI ORTHO PRO SZ7 (GLOVE) ×1
GLOVE PI ORTHO PRO STRL SZ7 (GLOVE) IMPLANT
GLOVE SS BIOGEL STRL SZ 7.5 (GLOVE) ×1 IMPLANT
GLOVE SUPERSENSE BIOGEL SZ 7.5 (GLOVE) ×1
GLOVE SURG SS PI 7.0 STRL IVOR (GLOVE) ×2 IMPLANT
GOWN PREVENTION PLUS XLARGE (GOWN DISPOSABLE) ×4 IMPLANT
GOWN STRL NON-REIN LRG LVL3 (GOWN DISPOSABLE) ×2 IMPLANT
GOWN STRL REIN XL XLG (GOWN DISPOSABLE) ×2 IMPLANT
HANDPIECE INTERPULSE COAX TIP (DISPOSABLE) ×2
HOOD PEEL AWAY FACE SHEILD DIS (HOOD) ×4 IMPLANT
IMMOBILIZER KNEE 22 UNIV (SOFTGOODS) ×1 IMPLANT
KIT BASIN OR (CUSTOM PROCEDURE TRAY) ×2 IMPLANT
KIT ROOM TURNOVER OR (KITS) ×2 IMPLANT
MANIFOLD NEPTUNE II (INSTRUMENTS) ×2 IMPLANT
NS IRRIG 1000ML POUR BTL (IV SOLUTION) ×2 IMPLANT
PACK TOTAL JOINT (CUSTOM PROCEDURE TRAY) ×2 IMPLANT
PAD ARMBOARD 7.5X6 YLW CONV (MISCELLANEOUS) ×4 IMPLANT
PAD CAST 4YDX4 CTTN HI CHSV (CAST SUPPLIES) ×1 IMPLANT
PADDING CAST COTTON 4X4 STRL (CAST SUPPLIES) ×2
PADDING CAST COTTON 6X4 STRL (CAST SUPPLIES) ×2 IMPLANT
RUBBERBAND STERILE (MISCELLANEOUS) ×2 IMPLANT
SET HNDPC FAN SPRY TIP SCT (DISPOSABLE) ×1 IMPLANT
SPONGE GAUZE 4X4 12PLY (GAUZE/BANDAGES/DRESSINGS) ×2 IMPLANT
STRIP CLOSURE SKIN 1/2X4 (GAUZE/BANDAGES/DRESSINGS) ×2 IMPLANT
SUCTION FRAZIER TIP 10 FR DISP (SUCTIONS) ×2 IMPLANT
SUT ETHIBOND NAB CT1 #1 30IN (SUTURE) ×4 IMPLANT
SUT MNCRL AB 3-0 PS2 18 (SUTURE) ×2 IMPLANT
SUT VIC AB 0 CT1 27 (SUTURE) ×4
SUT VIC AB 0 CT1 27XBRD ANBCTR (SUTURE) ×2 IMPLANT
SUT VIC AB 2-0 CT1 27 (SUTURE) ×2
SUT VIC AB 2-0 CT1 TAPERPNT 27 (SUTURE) ×2 IMPLANT
SYR 30ML SLIP (SYRINGE) ×2 IMPLANT
TOWEL OR 17X24 6PK STRL BLUE (TOWEL DISPOSABLE) ×2 IMPLANT
TOWEL OR 17X26 10 PK STRL BLUE (TOWEL DISPOSABLE) ×2 IMPLANT
TRAY FOLEY CATH 16FR SILVER (SET/KITS/TRAYS/PACK) ×1 IMPLANT
WATER STERILE IRR 1000ML POUR (IV SOLUTION) ×3 IMPLANT

## 2012-12-25 NOTE — Evaluation (Signed)
Physical Therapy Evaluation Patient Details Name: Whitney Medina MRN: [id number] DOB: 1932/12/06 Today's Date: 12/25/2012 Time: 6295-2841 PT Time Calculation (min): 29 min  PT Assessment / Plan / Recommendation History of Present Illness  Patient is a 79 yo female s/p Lt TKA.  Clinical Impression  Patient presents with problems listed below.  Will benefit from acute PT to maximize independence prior to discharge.  Patient lives alone - recommend ST-SNF at discharge for continued therapy.    PT Assessment  Patient needs continued PT services    Follow Up Recommendations  SNF    Does the patient have the potential to tolerate intense rehabilitation      Barriers to Discharge Decreased caregiver support Patient lives alone    Equipment Recommendations  Rolling walker with 5" wheels;3in1 (PT)    Recommendations for Other Services     Frequency 7X/week    Precautions / Restrictions Precautions Precautions: Knee Precaution Booklet Issued: Yes (comment) Precaution Comments: Provided education on precautions to patient. Required Braces or Orthoses: Knee Immobilizer - Left Knee Immobilizer - Left: On except when in CPM;Discontinue once straight leg raise with < 10 degree lag Restrictions Weight Bearing Restrictions: Yes LLE Weight Bearing: Weight bearing as tolerated   Pertinent Vitals/Pain       Mobility  Bed Mobility Bed Mobility: Supine to Sit;Sitting - Scoot to Edge of Bed Supine to Sit: 4: Min assist;HOB flat Sitting - Scoot to Edge of Bed: 4: Min guard;With rail Details for Bed Mobility Assistance: Educated patient on donning KI on LLE.  Verbal cues for technique for mobility.  Assist to bring LLE off of bed.  Instructed patient on using UE's to raise trunk to sitting position.  Sitting balance good. Transfers Transfers: Sit to Stand;Stand to [employer] Transfers Sit to Stand: 4: Min assist;With upper extremity assist;From bed;With armrests;From  chair/3-in-1 Stand to Sit: 4: Min assist;With upper extremity assist;With armrests;To chair/3-in-1 Stand Pivot Transfers: 4: Min assist Details for Transfer Assistance: Verbal cues for hand placement and technique.  Assist to rise to standing.  Patient able to take several steps to pivot to [facility].   Ambulation/Gait Ambulation/Gait Assistance: 4: Min assist Ambulation Distance (Feet): 5 Feet Assistive device: Rolling walker Ambulation/Gait Assistance Details: Verbal cues for safe use of RW and gait sequence.  Encouraged patient to put Lt foot flat on floor for more normal gait pattern with heel strike. Gait Pattern: Step-to pattern;Decreased stance time - left;Decreased step length - right;Antalgic Gait velocity: Slow gait speed    Exercises Total Joint Exercises Ankle Circles/Pumps: AROM;Both;10 reps;Seated   PT Diagnosis: Difficulty walking;Acute pain  PT Problem List: Decreased strength;Decreased range of motion;Decreased activity tolerance;Decreased balance;Decreased mobility;Decreased knowledge of use of DME;Decreased knowledge of precautions;Pain PT Treatment Interventions: DME instruction;Gait training;Functional mobility training;Therapeutic exercise;Patient/family education     PT Goals(Current goals can be found in the care plan section) Acute Rehab PT Goals Patient Stated Goal: To get stronger PT Goal Formulation: With patient Time For Goal Achievement: 01/01/13 Potential to Achieve Goals: Good  Visit Information  Last PT Received On: 12/25/12 Assistance Needed: +1 History of Present Illness: Patient is a 79 yo female s/p Lt TKA.       Prior Functioning  Home Living Family/patient expects to be discharged to:: Skilled nursing facility Living Arrangements: Alone Home Equipment: Gilmer Mor - single point Prior Function Level of Independence: Independent with assistive device(s) (Using cane at times) Communication Communication: No difficulties    Cognition   Cognition Arousal/Alertness: Awake/alert Behavior During Therapy:  WFL for tasks assessed/performed Overall Cognitive Status: Within Functional Limits for tasks assessed    Extremity/Trunk Assessment Upper Extremity Assessment Upper Extremity Assessment: Overall WFL for tasks assessed Lower Extremity Assessment Lower Extremity Assessment: LLE deficits/detail LLE Deficits / Details: Decreased strength and ROM due to surgery/pain LLE: Unable to fully assess due to pain Cervical / Trunk Assessment Cervical / Trunk Assessment: Normal   Balance Balance Balance Assessed: Yes Static Sitting Balance Static Sitting - Balance Support: No upper extremity supported;Feet supported Static Sitting - Level of Assistance: 5: Stand by assistance Static Sitting - Comment/# of Minutes: 6 Static Standing Balance Static Standing - Balance Support: Bilateral upper extremity supported Static Standing - Level of Assistance: 4: Min assist Static Standing - Comment/# of Minutes: 2  End of Session PT - End of Session Equipment Utilized During Treatment: Gait belt;Left knee immobilizer;Oxygen Activity Tolerance: Patient limited by pain;Patient limited by fatigue Patient left: in chair;with call bell/phone within reach Nurse Communication: Mobility status CPM Left Knee CPM Left Knee: Off (off at 15:55)  GP     Vena Austria 12/25/2012, 5:10 PM Durenda Hurt. Renaldo Fiddler, [facility] Acute Rehab Services Pager [phone]

## 2012-12-25 NOTE — H&P (View-Only) (Signed)
TOTAL KNEE ADMISSION H&P  Patient is being admitted for left total knee arthroplasty.  Subjective:  Chief Complaint:left knee pain.  HPI: Whitney Medina, 79 y.o. female, has a history of pain and functional disability in the left knee due to trauma and arthritis.  Patient fell down 7 steps at a [employer] while attending a wedding 06/10/2012.  She has failed non-surgical conservative treatments for greater than 12 weeks to includeNSAID's and/or analgesics, corticosteriod injections, viscosupplementation injections, flexibility and strengthening excercises, supervised PT with diminished ADL's post treatment, use of assistive devices and activity modification.  Onset of symptoms was abrupt, starting 1 years ago with rapidlly worsening course since that time. The patient noted prior procedures on the knee to include  arthroscopy and menisectomy on the left knee(s).  Patient currently rates pain in the left knee(s) at 10 out of 10 with activity. Patient has night pain, worsening of pain with activity and weight bearing, pain that interferes with activities of daily living, crepitus and joint swelling.  Patient has evidence of subchondral sclerosis, periarticular osteophytes and joint space narrowing by imaging studies.  There is no active infection.  Patient Active Problem List   Diagnosis Date Noted  . Hypothyroidism   . Hypercholesteremia   . Left knee DJD    Past Medical History  Diagnosis Date  . Hypothyroidism   . Hypercholesteremia   . Left knee DJD     Past Surgical History  Procedure Laterality Date  . Abdominal hysterectomy  1973  . Tonsillectomy  1961  . Shoulder adhesion release Left 2000     (Not in a hospital admission) Allergies  Allergen Reactions  . Codeine Nausea And Vomiting    Percocet OK    Current Outpatient Prescriptions on File Prior to Visit  Medication Sig Dispense Refill  . Apple Cider Vinegar 600 MG CAPS Take 1 capsule by mouth daily.      Marland Kitchen aspirin 325 MG  EC tablet Take 325 mg by mouth daily.      Marland Kitchen atorvastatin (LIPITOR) 10 MG tablet Take 10 mg by mouth daily.      . Calcium Carb-Cholecalciferol (CALCIUM 1000 + D PO) Take 1 tablet by mouth daily.      . Capsicum, Cayenne, 455 MG CAPS Take 1 capsule by mouth daily.      . Cholecalciferol (VITAMIN D) 2000 UNITS CAPS Take 1 capsule by mouth daily.      . Cinnamon 500 MG TABS Take 500 mg by mouth daily.      . Flaxseed, Linseed, (FLAXSEED OIL) 1000 MG CAPS Take 1 capsule by mouth 2 (two) times daily.      . Glucosamine-Chondroitin-MSM 500-400-125 MG TABS Take 2 tablets by mouth daily.      Marland Kitchen levothyroxine (SYNTHROID, LEVOTHROID) 100 MCG tablet Take 100 mcg by mouth daily before breakfast.      . loratadine (CLARITIN) 10 MG tablet Take 10 mg by mouth daily.      . meloxicam (MOBIC) 15 MG tablet Take 15 mg by mouth every other day.      . Multiple Vitamins-Minerals (MULTIVITAMIN WITH MINERALS) tablet Take 1 tablet by mouth daily.      . Omega-3 Fatty Acids (FISH OIL) 1000 MG CAPS Take 1 capsule by mouth 2 (two) times daily.      Marland Kitchen OVER THE COUNTER MEDICATION Take 1 tablet by mouth daily. Greens Source      . vitamin C (ASCORBIC ACID) 500 MG tablet Take 250 mg by mouth daily.  No current facility-administered medications on file prior to visit.   History  Substance Use Topics  . Smoking status: Never Smoker   . Smokeless tobacco: Not on file  . Alcohol Use: No    Family History  Problem Relation Age of Onset  . Cancer - Other Mother   . Melanoma Sister   . Heart disease Sister      Review of Systems  Constitutional: Negative.   HENT: Negative.   Eyes: Negative.   Cardiovascular: Negative.   Gastrointestinal: Positive for constipation. Negative for heartburn, nausea, vomiting, abdominal pain, diarrhea, blood in stool and melena.  Genitourinary: Negative.   Musculoskeletal: Positive for joint pain. Negative for back pain, falls, myalgias and neck pain.       Left knee  Skin:  Negative.   Neurological: Negative.   Endo/Heme/Allergies: Negative.   Psychiatric/Behavioral: Negative.     Objective:  Physical Exam  Constitutional: She is oriented to person, place, and time. She appears well-developed and well-nourished.  HENT:  Head: Normocephalic and atraumatic.  Mouth/Throat: Oropharynx is clear and moist.  Eyes: Conjunctivae and EOM are normal. Pupils are equal, round, and reactive to light.  Neck: Normal range of motion. Neck supple.  Cardiovascular: Normal rate, regular rhythm and normal heart sounds.   Respiratory: Effort normal and breath sounds normal.  GI: Soft. Bowel sounds are normal.  Genitourinary:  Not pertinent to current symptomatology therefore not examined.  Musculoskeletal:  Examination of her left knee reveals pain medially and laterally, 1+ crepitation 1+ synovitis range of motion -5 to 125 degrees, knee is stable with diffuse pain and normal patella tracking. Exam of her right knee reveals full range of motion without pain swelling weakness or instability. Vascular exam: pulses 2+ and symmetric.  Neurological: She is alert and oriented to person, place, and time.  Skin: Skin is warm and dry.  Psychiatric: She has a normal mood and affect. Her behavior is normal.    Vital signs in last 24 hours: Last recorded: 11/25 1300   BP: 124/78 Pulse: 82  Temp: 97.6 F (36.4 C)    Height: 5\' 9"  (1.753 m) SpO2: 96  Weight: 75.297 kg (166 lb)     Labs:   Estimated body mass index is 24.5 kg/(m^2) as calculated from the following:   Height as of this encounter: 5\' 9"  (1.753 m).   Weight as of this encounter: 75.297 kg (166 lb).   Imaging Review Plain radiographs demonstrate severe degenerative joint disease of the left knee(s). The overall alignment ismild valgus. The bone quality appears to be good for age and reported activity level.  Assessment/Plan:  End stage arthritis, left knee   The patient history, physical examination,  clinical judgment of the provider and imaging studies are consistent with end stage degenerative joint disease of the left knee(s) and total knee arthroplasty is deemed medically necessary. The treatment options including medical management, injection therapy arthroscopy and arthroplasty were discussed at length. The risks and benefits of total knee arthroplasty were presented and reviewed. The risks due to aseptic loosening, infection, stiffness, patella tracking problems, thromboembolic complications and other imponderables were discussed. The patient acknowledged the explanation, agreed to proceed with the plan and consent was signed. Patient is being admitted for inpatient treatment for surgery, pain control, PT, OT, prophylactic antibiotics, VTE prophylaxis, progressive ambulation and ADL's and discharge planning. The patient is planning to be discharged to skilled nursing facility [facility]  Rubby Barbary A. Gwinda Passe Physician Assistant [facility] [phone]  12/12/2012, 2:09 PM

## 2012-12-25 NOTE — Op Note (Signed)
MRN:     [id number] DOB/AGE:    [birth year] / 79 y.o.       OPERATIVE REPORT    DATE OF PROCEDURE:  12/25/2012       PREOPERATIVE DIAGNOSIS:   DJD LEFT KNEE      There is no weight on file to calculate BMI.                                                        POSTOPERATIVE DIAGNOSIS:   DJD LEFT KNEE                                                                      PROCEDURE:  Procedure(s): TOTAL KNEE ARTHROPLASTY- left Using Depuy Sigma RP implants #4 Femur, #4Tibia, 12.23mm sigma RP bearing, 35 Patella     SURGEON: Hoang Pettingill A    ASSISTANT:  Kirstin Shepperson PA-C   (Present and scrubbed throughout the case, critical for assistance with exposure, retraction, instrumentation, and closure.)         ANESTHESIA: GET with Femoral Nerve Block  DRAINS: foley, 2 medium hemovac in knee   TOURNIQUET TIME:   COMPLICATIONS:  None     SPECIMENS: None   INDICATIONS FOR PROCEDURE: The patient has  DJD LEFT KNEE, varus deformities, XR shows bone on bone arthritis. Patient has failed all conservative measures including anti-inflammatory medicines, narcotics, attempts at  exercise and weight loss, cortisone injections and viscosupplementation.  Risks and benefits of surgery have been discussed, questions answered.   DESCRIPTION OF PROCEDURE: The patient identified by armband, received  right femoral nerve block and IV antibiotics, in the holding area at [facility]. Patient taken to the operating room, appropriate anesthetic  monitors were attached General endotracheal anesthesia induced with  the patient in supine position, Foley catheter was inserted. Tourniquet  applied high to the operative thigh. Lateral post and foot positioner  applied to the table, the lower extremity was then prepped and draped  in usual sterile fashion from the ankle to the tourniquet. Time-out procedure was performed. The limb was wrapped with an Esmarch bandage and the tourniquet inflated to 365  mmHg. We began the operation by making the anterior midline incision starting at handbreadth above the patella going over the patella 1 cm medial to and  4 cm distal to the tibial tubercle. Small bleeders in the skin and the  subcutaneous tissue identified and cauterized. Transverse retinaculum was incised and reflected medially and a medial parapatellar arthrotomy was accomplished. the patella was everted and theprepatellar fat pad resected. The superficial medial collateral  ligament was then elevated from anterior to posterior along the proximal  flare of the tibia and anterior half of the menisci resected. The knee was hyperflexed exposing bone on bone arthritis. Peripheral and notch osteophytes as well as the cruciate ligaments were then resected. We continued to  work our way around posteriorly along the proximal tibia, and externally  rotated the tibia subluxing it out from underneath the femur. A McHale  retractor was placed through the notch and a lateral [facility]  retractor  placed, and we then drilled through the proximal tibia in line with the  axis of the tibia followed by an intramedullary guide rod and 2-degree  posterior slope cutting guide. The tibial cutting guide was pinned into place  allowing resection of 4 mm of bone medially and about 6 mm of bone  laterally because of her varus deformity. Satisfied with the tibial resection, we then  entered the distal femur 2 mm anterior to the PCL origin with the  intramedullary guide rod and applied the distal femoral cutting guide  set at 11mm, with 5 degrees of valgus. This was pinned along the  epicondylar axis. At this point, the distal femoral cut was accomplished without difficulty. We then sized for a #4 femoral component and pinned the guide in 3 degrees of external rotation.The chamfer cutting guide was pinned into place. The anterior, posterior, and chamfer cuts were accomplished without difficulty followed by  the Sigma RP box  cutting guide and the box cut. We also removed posterior osteophytes from the posterior femoral condyles. At this  time, the knee was brought into full extension. We checked our  extension and flexion gaps and found them symmetric at 12.41mm.  The patella thickness measured at 24 mm. We set the cutting guide at 15 and removed the posterior 9.5-10 mm  of the patella sized for 35 button and drilled the lollipop. The knee  was then once again hyperflexed exposing the proximal tibia. We sized for a #4 tibial base plate, applied the smokestack and the conical reamer followed by the the Delta fin keel punch. We then hammered into place the Sigma RP trial femoral component, inserted a 12.5-mm trial bearing, trial patellar button, and took the knee through range of motion from 0-130 degrees. No thumb pressure was required for patellar  tracking. At this point, all trial components were removed, a double batch of DePuy HV cement  was mixed and applied to all bony metallic mating surfaces except for the posterior condyles of the femur itself. In order, we  hammered into place the tibial tray and removed excess cement, the femoral component and removed excess cement, a 12.5-mm Sigma RP bearing  was inserted, and the knee brought to full extension with compression.  The patellar button was clamped into place, and excess cement  removed. While the cement cured the wound was irrigated out with normal saline solution pulse lavage, and medium Hemovac drains were placed.. Ligament stability and patellar tracking were checked and found to be excellent. The tourniquet was then released and hemostasis was obtained with cautery. The parapatellar arthrotomy was closed with  #1 ethibond suture. The subcutaneous tissue with 0 and 2-0 undyed  Vicryl suture, and 4-0 Monocryl.. A dressing of Xeroform,  4 x 4, dressing sponges, Webril, and Ace wrap applied. Needle and sponge count were correct times 2.The patient awakened,  extubated, and taken to recovery room without difficulty. Vascular status was normal, pulses 2+ and symmetric.   Whitney Medina A 12/25/2012, 8:41 AM

## 2012-12-25 NOTE — Anesthesia Postprocedure Evaluation (Signed)
  Anesthesia Post-op Note  Patient: Whitney Medina  Procedure(s) Performed: Procedure(s): TOTAL KNEE ARTHROPLASTY- left (Left)  Patient Location: PACU  Anesthesia Type:GA combined with regional for post-op pain  Level of Consciousness: awake  Airway and Oxygen Therapy: Patient Spontanous Breathing  Post-op Pain: mild  Post-op Assessment: Post-op Vital signs reviewed  Post-op Vital Signs: stable  Complications: No apparent anesthesia complications

## 2012-12-25 NOTE — Anesthesia Procedure Notes (Addendum)
Anesthesia Regional Block:  Femoral nerve block  Pre-Anesthetic Checklist: ,, timeout performed,,, Correct Laterality, Correct Procedure, Correct Position, site marked, risks and benefits discussed, surgical consent, pre-op evaluation,  At surgeon's request and post-op pain management  Laterality: Left and Upper  Prep: chloraprep       Needles:   Needle Type: Echogenic Needle      Needle Gauge: 21 and 21 G    Additional Needles:  Procedures: ultrasound guided (picture in chart) Femoral nerve block Narrative:  Start time: 12/25/2012 6:55 AM End time: 12/25/2012 7:10 AM Injection made incrementally with aspirations every 5 mL.  Performed by: Personally  Anesthesiologist: t massagee  Additional Notes: Tolerated well   Procedure Name: LMA Insertion Date/Time: 12/25/2012 7:18 AM Performed by: Gayla Medicus Pre-anesthesia Checklist: Patient identified, Timeout performed, Emergency Drugs available, Suction available and Patient being monitored Patient Re-evaluated:Patient Re-evaluated prior to inductionOxygen Delivery Method: Circle system utilized Preoxygenation: Pre-oxygenation with 100% oxygen Intubation Type: IV induction LMA: LMA inserted LMA Size: 4.0 Number of attempts: 1 Placement Confirmation: positive ETCO2 and breath sounds checked- equal and bilateral Dental Injury: Teeth and Oropharynx as per pre-operative assessment

## 2012-12-25 NOTE — Progress Notes (Signed)
Orthopedic Tech Progress Note Patient Details:  Whitney Medina 1932-05-12 [id number]  CPM Left Knee CPM Left Knee: On Left Knee Flexion (Degrees): 60 Left Knee Extension (Degrees): 0 Additional Comments: Trapeze bar and foot roll   Shawnie Pons 12/25/2012, 9:29 AM

## 2012-12-25 NOTE — Transfer of Care (Signed)
Immediate Anesthesia Transfer of Care Note  Patient: Whitney Medina  Procedure(s) Performed: Procedure(s): TOTAL KNEE ARTHROPLASTY- left (Left)  Patient Location: PACU  Anesthesia Type:General  Level of Consciousness: awake, alert  and oriented  Airway & Oxygen Therapy: Patient Spontanous Breathing and Patient connected to nasal cannula oxygen  Post-op Assessment: Report given to PACU RN, Post -op Vital signs reviewed and stable and Patient moving all extremities X 4  Post vital signs: Reviewed and stable  Complications: No apparent anesthesia complications

## 2012-12-25 NOTE — Anesthesia Preprocedure Evaluation (Addendum)
Anesthesia Evaluation  Patient identified by MRN, date of birth, ID band Patient awake    History of Anesthesia Complications (+) PONV  Airway Mallampati: II  Neck ROM: Full    Dental  (+) Teeth Intact   Pulmonary  breath sounds clear to auscultation        Cardiovascular Rhythm:Regular Rate:Normal     Neuro/Psych    GI/Hepatic   Endo/Other  Hypothyroidism   Renal/GU      Musculoskeletal   Abdominal   Peds  Hematology   Anesthesia Other Findings   Reproductive/Obstetrics                           Anesthesia Physical Anesthesia Plan  ASA: II  Anesthesia Plan: General   Post-op Pain Management:    Induction: Intravenous  Airway Management Planned: LMA  Additional Equipment:   Intra-op Plan:   Post-operative Plan: Extubation in OR  Informed Consent: I have reviewed the patients History and Physical, chart, labs and discussed the procedure including the risks, benefits and alternatives for the proposed anesthesia with the patient or authorized representative who has indicated his/her understanding and acceptance.   Dental advisory given  Plan Discussed with: CRNA and Surgeon  Anesthesia Plan Comments:        Anesthesia Quick Evaluation

## 2012-12-25 NOTE — Interval H&P Note (Signed)
History and Physical Interval Note:  12/25/2012 7:03 AM  Whitney Medina  has presented today for surgery, with the diagnosis of DJD LEFT KNEE  The various methods of treatment have been discussed with the patient and family. After consideration of risks, benefits and other options for treatment, the patient has consented to  Procedure(s): TOTAL KNEE ARTHROPLASTY (Left) as a surgical intervention .  The patient's history has been reviewed, patient examined, no change in status, stable for surgery.  I have reviewed the patient's chart and labs.  Questions were answered to the patient's satisfaction.     Salvatore Marvel A

## 2012-12-25 NOTE — Preoperative (Signed)
Beta Blockers   Reason not to administer Beta Blockers:Not Applicable 

## 2012-12-25 NOTE — Progress Notes (Signed)
Utilization review completed.  

## 2012-12-26 ENCOUNTER — Encounter (HOSPITAL_COMMUNITY): Payer: Self-pay | Admitting: Orthopedic Surgery

## 2012-12-26 LAB — BASIC METABOLIC PANEL
BUN: 13 mg/dL (ref 6–23)
CO2: 27 mEq/L (ref 19–32)
Calcium: 9.1 mg/dL (ref 8.4–10.5)
Creatinine, Ser: 0.68 mg/dL (ref 0.50–1.10)
GFR calc Af Amer: >90 mL/min (ref >90)
Potassium: 5.3 mEq/L — ABNORMAL HIGH (ref 3.5–5.1)

## 2012-12-26 LAB — CBC
HCT: 35.3 % — ABNORMAL LOW (ref 36.0–46.0)
MCH: 31 pg (ref 26.0–34.0)
MCV: 93.6 fL (ref 78.0–100.0)
Platelets: 183 10*3/uL (ref 150–400)
RDW: 12.8 % (ref 11.5–15.5)
WBC: 10.2 10*3/uL (ref 4.0–10.5)

## 2012-12-26 MED ORDER — CELECOXIB 200 MG PO CAPS
200.0000 mg | ORAL_CAPSULE | Freq: Every day | ORAL | Status: DC
  Administered 2012-12-26 – 2012-12-27 (×2): 200 mg via ORAL
  Filled 2012-12-26 (×3): qty 1

## 2012-12-26 MED ORDER — SODIUM CHLORIDE 0.9 % IV BOLUS (SEPSIS)
500.0000 mL | Freq: Once | INTRAVENOUS | Status: AC
  Administered 2012-12-26: 500 mL via INTRAVENOUS

## 2012-12-26 NOTE — Progress Notes (Signed)
Physical Therapy Treatment Patient Details Name: Whitney Medina MRN: [id number] DOB: Jul 30, 1932 Today's Date: 12/26/2012 Time: 2952-8413 PT Time Calculation (min): 17 min  PT Assessment / Plan / Recommendation  History of Present Illness Patient is a 79 yo female s/p Lt TKA.   PT Comments   Pt progressing well with mobility.  Ambulated without KI with no knee buckling noted.  Cont with plans of SNF at d/c due to pt lives alone & needs to be independent with mobility.     Follow Up Recommendations  SNF     Does the patient have the potential to tolerate intense rehabilitation     Barriers to Discharge        Equipment Recommendations  Rolling walker with 5" wheels;3in1 (PT)    Recommendations for Other Services    Frequency 7X/week   Progress towards PT Goals Progress towards PT goals: Progressing toward goals  Plan Current plan remains appropriate    Precautions / Restrictions Precautions Precautions: Knee Required Braces or Orthoses: Knee Immobilizer - Left Knee Immobilizer - Left: On except when in CPM;Discontinue once straight leg raise with < 10 degree lag Restrictions LLE Weight Bearing: Weight bearing as tolerated   Pertinent Vitals/Pain "it's just sore"    Mobility  Bed Mobility Bed Mobility: Sit to Supine Sit to Supine: 5: Supervision;HOB flat Transfers Transfers: Sit to Stand;Stand to Sit Sit to Stand: 4: Min guard;With upper extremity assist;With armrests;From chair/3-in-1 Stand to Sit: 4: Min guard;With upper extremity assist;With armrests;To chair/3-in-1;To bed Details for Transfer Assistance: cues to reinforce hand placement Ambulation/Gait Ambulation/Gait Assistance: 4: Min guard Ambulation Distance (Feet): 150 Feet Assistive device: Rolling walker Ambulation/Gait Assistance Details: Trialed ambulation without KI due to ability to perform SLR's without extension lag.  No knee buckling noted.  Pt progressing to step-through gait pattern.  Gait  Pattern: Step-through pattern General Gait Details: good Lt knee stability during stance phase Stairs: No Wheelchair Mobility Wheelchair Mobility: No    Exercises Total Joint Exercises Ankle Circles/Pumps: AROM;Both;10 reps Quad Sets: AROM;Strengthening;Both;10 reps Straight Leg Raises: AROM;Strengthening;Left;10 reps Long Arc Quad: AROM;Strengthening;Left;10 reps    PT Goals (current goals can now be found in the care plan section) Acute Rehab PT Goals PT Goal Formulation: With patient Time For Goal Achievement: 01/01/13 Potential to Achieve Goals: Good  Visit Information  Last PT Received On: 12/26/12 Assistance Needed: +1 History of Present Illness: Patient is a 79 yo female s/p Lt TKA.    Subjective Data      Cognition  Cognition Arousal/Alertness: Awake/alert Behavior During Therapy: WFL for tasks assessed/performed Overall Cognitive Status: Within Functional Limits for tasks assessed    Balance     End of Session PT - End of Session Equipment Utilized During Treatment: Gait belt Activity Tolerance: Patient tolerated treatment well Patient left: in bed;with call bell/phone within reach Nurse Communication: Mobility status   GP     Lara Mulch 12/26/2012, 10:31 AM  Verdell Face, PTA [phone] 12/26/2012

## 2012-12-26 NOTE — Evaluation (Signed)
Occupational Therapy Evaluation and Discharge Patient Details Name: Whitney Medina MRN: [id number] DOB: 26-Jun-1932 Today's Date: 12/26/2012 Time: 4098-1191 OT Time Calculation (min): 26 min  OT Assessment / Plan / Recommendation History of present illness Patient is a 79 yo female s/p Lt TKA.   Clinical Impression   This 79 yo female presents to acute OT with increased pain, decreased ROM in knee, and no one to A at home--thus she will benefit from ST SNF to get back to an Independent/Mod I level. Will defer remainder of OT to that facility.    OT Assessment  All further OT needs can be met in the next venue of care    Follow Up Recommendations  SNF       Equipment Recommendations   (TBD at next venue)          Precautions / Restrictions Precautions Precautions: Knee Required Braces or Orthoses: Knee Immobilizer - Left Knee Immobilizer - Left: On except when in CPM;Discontinue once straight leg raise with < 10 degree lag Restrictions LLE Weight Bearing: Weight bearing as tolerated   Pertinent Vitals/Pain 2/10 knee; left in foot roll    ADL  Eating/Feeding: Independent Where Assessed - Eating/Feeding: Chair Grooming: Supervision/safety;Wash/dry hands Where Assessed - Grooming: Unsupported sitting Upper Body Bathing: Set up Where Assessed - Upper Body Bathing: Unsupported sitting Lower Body Bathing: Min guard Where Assessed - Lower Body Bathing: Unsupported sit to stand Upper Body Dressing: Set up Where Assessed - Upper Body Dressing: Unsupported sitting Lower Body Dressing: Min guard Where Assessed - Lower Body Dressing: Unsupported sit to stand Toilet Transfer: Min Pension scheme manager Method: Sit to Barista: Raised toilet seat with arms (or 3-in-1 over toilet) Toileting - Clothing Manipulation and Hygiene: Min guard Where Assessed - Engineer, mining and Hygiene: Sit to stand from 3-in-1 or toilet Equipment Used: Gait  belt;Rolling walker Transfers/Ambulation Related to ADLs: min guard A for all with RW--the patient ambulated the whole length of one side of the unit (5N) and back to her room ADL Comments: Pt can don and doff her RLE sock while seated by bending forward    OT Diagnosis: Generalized weakness;Acute pain  OT Problem List: Decreased strength;Decreased range of motion;Pain;Impaired balance (sitting and/or standing);Decreased knowledge of use of DME or AE    Acute Rehab OT Goals Patient Stated Goal: rehab then home  Visit Information  Last OT Received On: 12/26/12 Assistance Needed: +1 History of Present Illness: Patient is a 79 yo female s/p Lt TKA.       Prior Functioning     Home Living Family/patient expects to be discharged to:: Skilled nursing facility Living Arrangements: Alone         Vision/Perception Vision - History Patient Visual Report: No change from baseline   Cognition  Cognition Arousal/Alertness: Awake/alert Behavior During Therapy: WFL for tasks assessed/performed Overall Cognitive Status: Within Functional Limits for tasks assessed    Extremity/Trunk Assessment Upper Extremity Assessment Upper Extremity Assessment: Overall WFL for tasks assessed     Mobility Bed Mobility Bed Mobility: Supine to Sit;Sitting - Scoot to Edge of Bed Supine to Sit: 7: Independent;HOB flat Sitting - Scoot to Edge of Bed: 7: Independent Transfers Transfers: Sit to Stand;Stand to Sit Sit to Stand: 4: Min guard;With upper extremity assist;From bed Stand to Sit: 4: Min guard;With upper extremity assist;With armrests;To chair/3-in-1           End of Session OT - End of Session Equipment Utilized During Treatment:  Gait belt;Rolling walker Activity Tolerance: Patient tolerated treatment well Patient left: in chair;with call Whitney/phone within reach CPM Left Knee CPM Left Knee: Off Additional Comments: foot roll       Evette Georges [phone] 12/26/2012, 3:01  PM

## 2012-12-26 NOTE — Progress Notes (Addendum)
Clinical Social Work Department CLINICAL SOCIAL WORK PLACEMENT NOTE 12/26/2012  Patient:  Whitney Medina, Whitney Medina  Account Number:  [id number] Admit date:  12/25/2012  Clinical Social Worker:  Sharol Harness, Theresia Majors  Date/time:  12/26/2012 11:20 AM  Clinical Social Work is seeking post-discharge placement for this patient at the following level of care:   SKILLED NURSING   (*CSW will update this form in Epic as items are completed)   12/26/2012  Patient/family provided with [facility] Department of Clinical Social Work's list of facilities offering this level of care within the geographic area requested by the patient (or if unable, by the patient's family).  12/26/2012  Patient/family informed of their freedom to choose among providers that offer the needed level of care, that participate in Medicare, Medicaid or managed care program needed by the patient, have an available bed and are willing to accept the patient.  12/26/2012  Patient/family informed of [facility]' ownership interest in [facility], as well as of the fact that they are under no obligation to receive care at this facility.  PASARR submitted to EDS on 12/26/2012 PASARR number received from EDS on 12/26/2012  FL2 transmitted to all facilities in geographic area requested by pt/family on  12/26/2012 FL2 transmitted to all facilities within larger geographic area on   Patient informed that his/her managed care company has contracts with or will negotiate with  certain facilities, including the following:     Patient/family informed of bed offers received: 12/26/2012  Patient chooses bed at [facility] Physician recommends and patient [facility] bed [facility]    [facility] Patient to be transferred to facility by [facility]  The following physician request were entered in Epic:   Additional Comments:  Sharesa Kemp, LCSWA [phone]

## 2012-12-26 NOTE — Progress Notes (Signed)
Clinical Social Work Department BRIEF PSYCHOSOCIAL ASSESSMENT 12/26/2012  Patient:  Whitney Medina, Whitney Medina     Account Number:  [id number]     Admit date:  12/25/2012  Clinical Social Worker:  Harless Nakayama  Date/Time:  12/26/2012 11:26 AM  Referred by:  Physician  Date Referred:  12/26/2012 Referred for  SNF Placement   Other Referral:   Interview type:  Patient Other interview type:    PSYCHOSOCIAL DATA Living Status:  ALONE Admitted from facility:   Level of care:   Primary support name:  Elvera Bicker [phone] Primary support relationship to patient:  CHILD, ADULT Degree of support available:   Pt has supportive family    CURRENT CONCERNS Current Concerns  Post-Acute Placement   Other Concerns:    SOCIAL WORK ASSESSMENT / PLAN CSW spoke with pt about PT recommendation for SNF. Pt already aware and informed CSW that she has already been in contact with [facility]. CSW explained referral process. Pt was understanding. Pt also told CSW she would need non-emergent ambulance transport at dc.    CSW aware pt is [employer]. Clinicals have been faxed to [facility] and facility has been asked to submit precert as soon as possible to avoid insurance holding up dc.   Assessment/plan status:  Psychosocial Support/Ongoing Assessment of Needs Other assessment/ plan:   Information/referral to community resources:   SNF list denied    PATIENT'S/FAMILY'S RESPONSE TO PLAN OF CARE: Pt is agreeable to ST rehab at [facility], [place] [phone]

## 2012-12-26 NOTE — Progress Notes (Signed)
Subjective: 1 Day Post-Op Procedure(s) (LRB): TOTAL KNEE ARTHROPLASTY- left (Left) Patient reports pain as 5 on 0-10 scale.    Objective: Vital signs in last 24 hours: Temp:  [97.4 F (36.3 C)-99.1 F (37.3 C)] 99 F (37.2 C) (12/09 0513) Pulse Rate:  [73-93] 74 (12/09 0513) Resp:  [11-24] 19 (12/09 0513) BP: (120-166)/(59-89) 120/59 mmHg (12/09 0513) SpO2:  [93 %-99 %] 94 % (12/09 0513) Weight:  [75.297 kg (166 lb)] 75.297 kg (166 lb) (12/09 0030)  Intake/Output from previous day: 12/08 0701 - 12/09 0700 In: 3910 [P.O.:1070; I.V.:2740; IV Piggyback:100] Out: 425 [Drains:400; Blood:25] Intake/Output this shift:     Recent Labs  12/26/12 0530  HGB 11.7*    Recent Labs  12/26/12 0530  WBC 10.2  RBC 3.77*  HCT 35.3*  PLT 183    Recent Labs  12/26/12 0530  NA 136  K 5.3*  CL 102  CO2 27  BUN 13  CREATININE 0.68  GLUCOSE 157*  CALCIUM 9.1   No results found for this basename: LABPT, INR,  in the last 72 hours  Neurologically intact ABD soft Neurovascular intact Sensation intact distally Intact pulses distally Incision: dressing C/D/I  Assessment/Plan: 1 Day Post-Op Procedure(s) (LRB): TOTAL KNEE ARTHROPLASTY- left (Left) Advance diet Up with therapy D/C NS with 20 of K   Due to hyperkalemia Will do 2 500cc bolus of NS  4 hrs a part Planning on SNF  Whitney Medina 12/26/2012, 7:08 AM

## 2012-12-26 NOTE — Care Management Note (Signed)
CARE MANAGEMENT NOTE 12/26/2012  Patient:  Whitney Medina, Whitney Medina   Account Number:  [id number]  Date Initiated:  12/26/2012  Documentation initiated by:  Vance Peper  Subjective/Objective Assessment:   79 yr old female s/p left total knee arthroplasty.     Action/Plan:   patient is for shortterm rehab at [facility]. Social worker is aware.  Preoperatively setup for Home Health with [facility].   Anticipated DC Date:  12/27/2012   Anticipated DC Plan:  SKILLED NURSING FACILITY  In-house referral  Clinical Social Worker         Choice offered to / List presented to:             Status of service:  Completed, signed off Medicare Important Message given?   (If response is "NO", the following Medicare IM given date fields will be blank) Date Medicare IM given:   Date Additional Medicare IM given:    Discharge Disposition:  SKILLED NURSING FACILITY

## 2012-12-27 LAB — BASIC METABOLIC PANEL
BUN: 18 mg/dL (ref 6–23)
Calcium: 9 mg/dL (ref 8.4–10.5)
Chloride: 104 mEq/L (ref 96–112)
Creatinine, Ser: 0.74 mg/dL (ref 0.50–1.10)
GFR calc Af Amer: >90 mL/min (ref >90)
GFR calc non Af Amer: 79 mL/min — ABNORMAL LOW (ref >90)
Glucose, Bld: 122 mg/dL — ABNORMAL HIGH (ref 70–99)
Potassium: 5.3 mEq/L — ABNORMAL HIGH (ref 3.5–5.1)
Sodium: 139 mEq/L (ref 135–145)

## 2012-12-27 LAB — CBC
HCT: 31.6 % — ABNORMAL LOW (ref 36.0–46.0)
Hemoglobin: 10.4 g/dL — ABNORMAL LOW (ref 12.0–15.0)
MCH: 31.5 pg (ref 26.0–34.0)
MCHC: 32.9 g/dL (ref 30.0–36.0)
MCV: 95.8 fL (ref 78.0–100.0)
Platelets: 173 10*3/uL (ref 150–400)
RDW: 13.3 % (ref 11.5–15.5)
WBC: 10.8 10*3/uL — ABNORMAL HIGH (ref 4.0–10.5)

## 2012-12-27 MED ORDER — OXYCODONE HCL 5 MG PO TABS: ORAL_TABLET | ORAL | Status: DC

## 2012-12-27 MED ORDER — DSS 100 MG PO CAPS: ORAL_CAPSULE | ORAL | Status: DC

## 2012-12-27 MED ORDER — BISACODYL 5 MG PO TBEC: DELAYED_RELEASE_TABLET | ORAL | Status: DC

## 2012-12-27 MED ORDER — CELECOXIB 200 MG PO CAPS: ORAL_CAPSULE | ORAL | Status: DC

## 2012-12-27 MED ORDER — ASPIRIN 325 MG PO TBEC: DELAYED_RELEASE_TABLET | ORAL | Status: DC

## 2012-12-27 NOTE — Progress Notes (Signed)
CSW (Clinical Child psychotherapist) received notification from facility that insurance Berkley Harvey has been received. Pt can dc when medically stable.   Layni Kreamer, LCSWA [phone]

## 2012-12-27 NOTE — Progress Notes (Signed)
CSW (Clinical Social Worker) prepared pt dc packet and placed with shadow chart. CSW arranged non-emergent ambulance transport. Pt, pt nurse, and facility informed. CSW signing off.  Kou Gucciardo, LCSWA [phone]  

## 2012-12-27 NOTE — Discharge Summary (Signed)
Patient ID: Whitney Medina MRN: [id number] DOB/AGE: [birth year] 79 y.o.  Admit date: 12/25/2012 Discharge date: 12/27/2012  Admission Diagnoses:  Principal Problem:   Left knee DJD Active Problems:   Hypothyroidism   Hypercholesteremia   DJD (degenerative joint disease) of knee   Discharge Diagnoses:  Same  Past Medical History  Diagnosis Date  . Hypothyroidism   . Hypercholesteremia   . Left knee DJD   . PONV (postoperative nausea and vomiting)   . History of anemia     Surgeries: Procedure(s): TOTAL KNEE ARTHROPLASTY- left on 12/25/2012   Consultants:    Discharged Condition: Improved  Hospital Course: Whitney Medina is an 79 y.o. female who was admitted 12/25/2012 for operative treatment ofLeft knee DJD. Patient has severe unremitting pain that affects sleep, daily activities, and work/hobbies. After pre-op clearance the patient was taken to the operating room on 12/25/2012 and underwent  Procedure(s): TOTAL KNEE ARTHROPLASTY- left.    Patient was given perioperative antibiotics: Anti-infectives   Start     Dose/Rate Route Frequency Ordered Stop   12/25/12 1200  ceFAZolin (ANCEF) IVPB 1 g/50 mL premix     1 g 100 mL/hr over 30 Minutes Intravenous Every 6 hours 12/25/12 1120 12/25/12 1742   12/25/12 0600  ceFAZolin (ANCEF) IVPB 2 g/50 mL premix     2 g 100 mL/hr over 30 Minutes Intravenous On call to O.R. 12/24/12 1232 12/25/12 0725       Patient was given sequential compression devices, early ambulation, and chemoprophylaxis to prevent DVT.  Patient benefited maximally from hospital stay and there were no complications.    Recent vital signs: Patient Vitals for the past 24 hrs:  BP Temp Pulse Resp SpO2  12/27/12 0541 128/65 mmHg 98.2 F (36.8 C) 78 17 93 %  12/27/12 0400 - - - 17 94 %  12/27/12 0000 - - - 18 94 %  12/26/12 2100 121/58 mmHg 98.3 F (36.8 C) 81 16 92 %  12/26/12 2000 - - - 17 93 %  12/26/12 1600 - - - 16 -  12/26/12 1436 115/54 mmHg  97.8 F (36.6 C) 77 18 96 %     Recent laboratory studies:  Recent Labs  12/26/12 0530 12/27/12 0510  WBC 10.2 10.8*  HGB 11.7* 10.4*  HCT 35.3* 31.6*  PLT 183 173  NA 136 139  K 5.3* 5.3*  CL 102 104  CO2 27 32  BUN 13 18  CREATININE 0.68 0.74  GLUCOSE 157* 122*  CALCIUM 9.1 9.0     Discharge Medications:     Medication List    STOP taking these medications       Apple Cider Vinegar 600 MG Caps     Capsicum (Cayenne) 455 MG Caps     Cinnamon 500 MG Tabs     Fish Oil 1000 MG Caps     Flaxseed Oil 1000 MG Caps     Glucosamine-Chondroitin-MSM 500-400-125 MG Tabs     meloxicam 15 MG tablet  Commonly known as:  MOBIC     OVER THE COUNTER MEDICATION      TAKE these medications       aspirin 325 MG EC tablet  1 tab a day for the next 30 days to prevent blood clots     atorvastatin 10 MG tablet  Commonly known as:  LIPITOR  Take 10 mg by mouth daily.     bisacodyl 5 MG EC tablet  Commonly known as:  DULCOLAX  Take 2  tablets every night with dinner until bowel movement.  LAXITIVE.  Restart if two days since last bowel movement     CALCIUM 1000 + D PO  Take 1 tablet by mouth daily.     celecoxib 200 MG capsule  Commonly known as:  CELEBREX  1 tab po q day with food for pain and  swelling     DSS 100 MG Caps  1 tab 2 times a day while on narcotics.  STOOL SOFTENER     levothyroxine 100 MCG tablet  Commonly known as:  SYNTHROID, LEVOTHROID  Take 100 mcg by mouth daily before breakfast.     loratadine 10 MG tablet  Commonly known as:  CLARITIN  Take 10 mg by mouth daily.     multivitamin with minerals tablet  Take 1 tablet by mouth daily.     oxyCODONE 5 MG immediate release tablet  Commonly known as:  Oxy IR/ROXICODONE  1-2 tablets every 4-6 hrs as needed for pain     vitamin C 500 MG tablet  Commonly known as:  ASCORBIC ACID  Take 250 mg by mouth daily.     Vitamin D 2000 UNITS Caps  Take 1 capsule by mouth daily.        Diagnostic  Studies: Dg Chest 2 View  12/18/2012   CLINICAL DATA:  Pre-admit for knee surgery.  EXAM: CHEST  2 VIEW  COMPARISON:  None.  FINDINGS: There is a 5 mm calcified right lower lobe pulmonary nodule likely reflecting sequela of prior granulomatous disease. There is no focal parenchymal opacity, pleural effusion, or pneumothorax. The heart and mediastinal contours are unremarkable.  The osseous structures are unremarkable.  IMPRESSION: No active cardiopulmonary disease.   Electronically Signed   By: Elige Ko   On: 12/18/2012 12:27    Disposition: stable   Discharged to Skilled Nursing facility      Discharge Orders   Future Orders Complete By Expires   Call MD / Call 911  As directed    Comments:     If you experience chest pain or shortness of breath, CALL 911 and be transported to the hospital emergency room.  If you develope a fever above 101 F, pus (white drainage) or increased drainage or redness at the wound, or calf pain, call your surgeon's office.   Change dressing  As directed    Comments:     Change the dressing daily with sterile 4 x 4 inch gauze dressing and apply TED hose.  You may clean the incision with alcohol prior to redressing.   Constipation Prevention  As directed    Comments:     Drink plenty of fluids.  Prune juice may be helpful.  You may use a stool softener, such as Colace (over the counter) 100 mg twice a day.  Use MiraLax (over the counter) for constipation as needed.   CPM  As directed    Comments:     Continuous passive motion machine (CPM):      Use the CPM from 0 to 90 for 6 hours per day.       You may break it up into 2 or 3 sessions per day.      Use CPM for 2 weeks or until you are told to stop.   Diet - low sodium heart healthy  As directed    Discharge instructions  As directed    Comments:     Total Knee Replacement Care After Refer to this sheet  in the next few weeks. These discharge instructions provide you with general information on caring for  yourself after you leave the hospital. Your caregiver may also give you specific instructions. Your treatment has been planned according to the most current medical practices available, but unavoidable complications sometimes occur. If you have any problems or questions after discharge, please call your caregiver. Regaining a near full range of motion of your knee within the first 3 to 6 weeks after surgery is critical. HOME CARE INSTRUCTIONS  You may resume a normal diet and activities as directed.  Perform exercises as directed.  Place gray foam block, curve side up under heel at all times except when in CPM or when walking.  DO NOT modify, tear, cut, or change in any way the gray foam block. You will receive physical therapy daily  Take showers instead of baths until informed otherwise.  You may shower on Sunday.  Please wash whole leg including wound with soap and water  Change bandages (dressings)daily It is OK to take over-the-counter tylenol in addition to the oxycodone for pain, discomfort, or fever. Oxycodone is VERY constipating.  Please take stool softener twice a day and laxatives daily until bowels are regular Eat a well-balanced diet.  Avoid lifting or driving until you are instructed otherwise.  Make an appointment to see your caregiver for stitches (suture) or staple removal as directed.  If you have been sent home with a continuous passive motion machine (CPM machine), 0-90 degrees 6 hrs a day   2 hrs a shift SEEK MEDICAL CARE IF: You have swelling of your calf or leg.  You develop shortness of breath or chest pain.  You have redness, swelling, or increasing pain in the wound.  There is pus or any unusual drainage coming from the surgical site.  You notice a bad smell coming from the surgical site or dressing.  The surgical site breaks open after sutures or staples have been removed.  There is persistent bleeding from the suture or staple line.  You are getting worse or are not  improving.  You have any other questions or concerns.  SEEK IMMEDIATE MEDICAL CARE IF:  You have a fever.  You develop a rash.  You have difficulty breathing.  You develop any reaction or side effects to medicines given.  Your knee motion is decreasing rather than improving.  MAKE SURE YOU:  Understand these instructions.  Will watch your condition.  Will get help right away if you are not doing well or get worse.   Do not put a pillow under the knee. Place it under the heel.  As directed    Comments:     Place gray foam block, curve side up under heel at all times except when in CPM or when walking.  DO NOT modify, tear, cut, or change in any way the gray foam block.   Increase activity slowly as tolerated  As directed    TED hose  As directed    Comments:     Use stockings (TED hose) for 2 weeks on both leg(s).  You may remove them at night for sleeping.      Follow-up Information   Follow up with Nilda Simmer, MD On 01/09/2013. (appt time 10:30)    Specialty:  Orthopedic Surgery   Contact information:   [address] [place] [address] [phone]        Signed: Pascal Lux 12/27/2012, 1:35 PM

## 2012-12-27 NOTE — Progress Notes (Signed)
RN attempted to call report to [facility]. Told by Diplomatic Services operational officer that the phones are not working at this current time and that she will relay the message that I called to give report.

## 2012-12-27 NOTE — Progress Notes (Signed)
Patient will be discharged to [facility] for further rehabilitation post a left total knee arthoplasty. She is traveling via ambulance once transport has been called.

## 2012-12-27 NOTE — Progress Notes (Signed)
Physical Therapy Treatment Patient Details Name: Whitney Medina MRN: [id number] DOB: 1932/09/15 Today's Date: 12/27/2012 Time: 4098-1191 PT Time Calculation (min): 19 min  PT Assessment / Plan / Recommendation  History of Present Illness Patient is a 79 yo female s/p Lt TKA.   PT Comments   Pt moving very well.    Follow Up Recommendations  SNF     Does the patient have the potential to tolerate intense rehabilitation     Barriers to Discharge        Equipment Recommendations  Rolling walker with 5" wheels;3in1 (PT)    Recommendations for Other Services    Frequency 7X/week   Progress towards PT Goals Progress towards PT goals: Progressing toward goals  Plan Current plan remains appropriate    Precautions / Restrictions Precautions Precautions: Knee Restrictions Weight Bearing Restrictions: Yes LLE Weight Bearing: Weight bearing as tolerated   Pertinent Vitals/Pain 2/10 Lt knee.      Mobility  Bed Mobility Bed Mobility: Not assessed Transfers Transfers: Sit to Stand;Stand to Sit Sit to Stand: 5: Supervision;With upper extremity assist;With armrests;From chair/3-in-1 Stand to Sit: 5: Supervision;With upper extremity assist;With armrests;To chair/3-in-1 Details for Transfer Assistance: Pt demonsrates safe hand placement & technique Ambulation/Gait Ambulation/Gait Assistance: 5: Supervision Ambulation Distance (Feet): 200 Feet Assistive device: Rolling walker Ambulation/Gait Assistance Details: Pt doing well with step-through gait sequencing & appears to be using UE's on RW minimally.   Gait Pattern: Step-through pattern General Gait Details: good Lt knee stability during stance phase Stairs: No Wheelchair Mobility Wheelchair Mobility: No    Exercises Total Joint Exercises Ankle Circles/Pumps: AROM;Both;15 reps Quad Sets: AROM;Both;15 reps Heel Slides: AROM;Strengthening;Left;15 reps Hip ABduction/ADduction: AROM;Strengthening;Left;15 reps Straight Leg  Raises: AROM;Strengthening;Left;15 reps Long Arc Quad: AROM;Strengthening;Left;15 reps     PT Goals (current goals can now be found in the care plan section) Acute Rehab PT Goals Patient Stated Goal: rehab then home PT Goal Formulation: With patient Time For Goal Achievement: 01/01/13 Potential to Achieve Goals: Good  Visit Information  Last PT Received On: 12/27/12 Assistance Needed: +1 History of Present Illness: Patient is a 79 yo female s/p Lt TKA.    Subjective Data  Patient Stated Goal: rehab then home   Cognition  Cognition Arousal/Alertness: Awake/alert Behavior During Therapy: WFL for tasks assessed/performed Overall Cognitive Status: Within Functional Limits for tasks assessed    Balance     End of Session PT - End of Session Activity Tolerance: Patient tolerated treatment well Patient left:  (in bathroom getting bathed up; Nsing made aware) Nurse Communication: Mobility status   GP     Lara Mulch 12/27/2012, 10:57 AM  Verdell Face, PTA [phone] 12/27/2012

## 2012-12-29 ENCOUNTER — Non-Acute Institutional Stay (SKILLED_NURSING_FACILITY): Payer: Medicare HMO | Admitting: Adult Health

## 2012-12-29 DIAGNOSIS — IMO0002 Reserved for concepts with insufficient information to code with codable children: Secondary | ICD-10-CM

## 2012-12-29 DIAGNOSIS — E039 Hypothyroidism, unspecified: Secondary | ICD-10-CM

## 2012-12-29 DIAGNOSIS — M1712 Unilateral primary osteoarthritis, left knee: Secondary | ICD-10-CM

## 2012-12-29 DIAGNOSIS — D62 Acute posthemorrhagic anemia: Secondary | ICD-10-CM

## 2012-12-29 DIAGNOSIS — M1710 Unilateral primary osteoarthritis, unspecified knee: Secondary | ICD-10-CM

## 2012-12-29 DIAGNOSIS — K5900 Constipation, unspecified: Secondary | ICD-10-CM

## 2012-12-29 DIAGNOSIS — E7800 Pure hypercholesterolemia, unspecified: Secondary | ICD-10-CM

## 2013-01-03 ENCOUNTER — Encounter: Payer: Self-pay | Admitting: Internal Medicine

## 2013-01-03 ENCOUNTER — Non-Acute Institutional Stay (SKILLED_NURSING_FACILITY): Payer: Medicare HMO | Admitting: Internal Medicine

## 2013-01-03 DIAGNOSIS — D62 Acute posthemorrhagic anemia: Secondary | ICD-10-CM | POA: Insufficient documentation

## 2013-01-03 DIAGNOSIS — I10 Essential (primary) hypertension: Secondary | ICD-10-CM

## 2013-01-03 DIAGNOSIS — M1712 Unilateral primary osteoarthritis, left knee: Secondary | ICD-10-CM

## 2013-01-03 DIAGNOSIS — E039 Hypothyroidism, unspecified: Secondary | ICD-10-CM

## 2013-01-03 DIAGNOSIS — M1710 Unilateral primary osteoarthritis, unspecified knee: Secondary | ICD-10-CM

## 2013-01-03 NOTE — Progress Notes (Signed)
HISTORY & PHYSICAL  DATE: 01/03/2013   FACILITY: [facility]  LEVEL OF CARE: SNF (31)  ALLERGIES:  Allergies  Allergen Reactions  . Codeine Nausea And Vomiting    Percocet OK    CHIEF COMPLAINT:  Manage left knee osteoarthritis, acute blood loss anemia and hypothyroidism  HISTORY OF PRESENT ILLNESS: Patient is a 79 year old Caucasian female  KNEE OSTEOARTHRITIS: Patient had a history of pain and functional disability in the knee due to end-stage osteoarthritis and has failed nonsurgical conservative treatments. Patient had worsening of pain with activity and weight bearing, pain that interfered with activities of daily living & pain with passive range of motion. Therefore patient underwent total knee arthroplasty and tolerated the procedure well. Patient is admitted to this facility for sort short-term rehabilitation. Patient denies knee pain.  ANEMIA: The anemia has been stable. The patient denies fatigue, melena or hematochezia. Postoperatively patient suffered acute blood loss. Last hemoglobins are 10.4, 11.7.  HYPOTHYROIDISM: The hypothyroidism remains stable. No complications noted from the medications presently being used.  The patient denies fatigue or constipation.  Last TSH not available  PAST MEDICAL HISTORY :  Past Medical History  Diagnosis Date  . Hypothyroidism   . Hypercholesteremia   . Left knee DJD   . PONV (postoperative nausea and vomiting)   . History of anemia     PAST SURGICAL HISTORY: Past Surgical History  Procedure Laterality Date  . Abdominal hysterectomy  1973  . Tonsillectomy  1961  . Shoulder adhesion release Left 2000  . Knee arthroscopy Left   . Total knee arthroplasty Left 12/25/2012    Procedure: TOTAL KNEE ARTHROPLASTY- left;  Surgeon: Nilda Simmer, MD;  Location: [facility] OR;  Service: Orthopedics;  Laterality: Left;    SOCIAL HISTORY:  reports that she has never smoked. She does not have any smokeless tobacco  history on file. She reports that she drinks alcohol. She reports that she does not use illicit drugs.  FAMILY HISTORY:  Family History  Problem Relation Age of Onset  . Cancer - Other Mother   . Melanoma Sister   . Heart disease Sister     CURRENT MEDICATIONS: Reviewed per [facility]  REVIEW OF SYSTEMS:  See HPI otherwise 14 point ROS is negative.  PHYSICAL EXAMINATION  VS:  T 98.8       P 90       RR 18       BP 136/74       POX% 98          GENERAL: no acute distress, normal body habitus EYES: conjunctivae normal, sclerae normal, normal eye lids MOUTH/THROAT: lips without lesions,no lesions in the mouth,tongue is without lesions,uvula elevates in midline NECK: supple, trachea midline, no neck masses, no thyroid tenderness, no thyromegaly LYMPHATICS: no LAN in the neck, no supraclavicular LAN RESPIRATORY: breathing is even & unlabored, BS CTAB CARDIAC: RRR, no murmur,no extra heart sounds, +1 bilateral lower extremity edema, pedal pulses +1 GI:  ABDOMEN: abdomen soft, normal BS, no masses, no tenderness  LIVER/SPLEEN: no hepatomegaly, no splenomegaly MUSCULOSKELETAL: HEAD: normal to inspection & palpation BACK: no kyphosis, scoliosis or spinal processes tenderness EXTREMITIES: LEFT UPPER EXTREMITY: full range of motion, normal strength & tone RIGHT UPPER EXTREMITY:  full range of motion, normal strength & tone LEFT LOWER EXTREMITY:  range of motion not tested due to surgery, normal strength & tone RIGHT LOWER EXTREMITY:  full range of motion, normal strength & tone PSYCHIATRIC: the patient is alert &  oriented to person, affect & behavior appropriate  LABS/RADIOLOGY:  Labs reviewed: Basic Metabolic Panel:  Recent Labs  16/10/96 0929 12/26/12 0530 12/27/12 0510  NA 142 136 139  K 4.9 5.3* 5.3*  CL 102 102 104  CO2 30 27 32  GLUCOSE 98 157* 122*  BUN 13 13 18   CREATININE 0.76 0.68 0.74  CALCIUM 9.8 9.1 9.0   Liver Function Tests:  Recent Labs  12/18/12 0929  AST  23  ALT 17  ALKPHOS 76  BILITOT 0.6  PROT 7.4  ALBUMIN 4.1   CBC:  Recent Labs  12/18/12 0929 12/26/12 0530 12/27/12 0510  WBC 5.3 10.2 10.8*  NEUTROABS 3.2  --   --   HGB 14.7 11.7* 10.4*  HCT 43.5 35.3* 31.6*  MCV 92.9 93.6 95.8  PLT 190 183 173   Chest x-ray-no acute disease  ASSESSMENT/PLAN:  Left knee osteoarthritis- status post left total knee arthroplasty. Continue rehabilitation Acute blood loss anemia-reassess Hypothyroidism-continue levothyroxine Hypertension-well controlled Hyperlipidemia -- continue Lipitor Constipation-MiraLax was started  I have reviewed patient's medical records received at admission/from hospitalization.  CPT CODE: 04540

## 2013-01-09 ENCOUNTER — Non-Acute Institutional Stay (SKILLED_NURSING_FACILITY): Payer: Medicare HMO | Admitting: Adult Health

## 2013-01-09 DIAGNOSIS — IMO0002 Reserved for concepts with insufficient information to code with codable children: Secondary | ICD-10-CM

## 2013-01-09 DIAGNOSIS — D62 Acute posthemorrhagic anemia: Secondary | ICD-10-CM

## 2013-01-09 DIAGNOSIS — E7800 Pure hypercholesterolemia, unspecified: Secondary | ICD-10-CM

## 2013-01-09 DIAGNOSIS — E039 Hypothyroidism, unspecified: Secondary | ICD-10-CM

## 2013-01-09 DIAGNOSIS — M1712 Unilateral primary osteoarthritis, left knee: Secondary | ICD-10-CM

## 2013-01-09 DIAGNOSIS — K5900 Constipation, unspecified: Secondary | ICD-10-CM

## 2013-01-09 DIAGNOSIS — M1710 Unilateral primary osteoarthritis, unspecified knee: Secondary | ICD-10-CM

## 2013-01-14 DIAGNOSIS — K5900 Constipation, unspecified: Secondary | ICD-10-CM | POA: Insufficient documentation

## 2013-01-14 NOTE — Progress Notes (Addendum)
Patient ID: Whitney Medina, female   DOB: [birth year], 79 y.o.   MRN: [id number]                       PROGRESS NOTE  DATE: 12/29/2012  FACILITY: Nursing Home Location: [facility]  LEVEL OF CARE: SNF (31)  Acute Visit  CHIEF COMPLAINT:  Follow-up Hospitalization  HISTORY OF PRESENT ILLNESS: This is a 79 year old female who has been admitted to [facility] on 12/27/12 from [facility] with Left knee DJD S/P left total knee arthroplasty. She has been admitted for a short-term rehabilitation.  REASSESSMENT OF ONGOING PROBLEM(S):  HYPOTHYROIDISM: The hypothyroidism remains stable. No complications noted from the medications presently being used.  The patient denies fatigue or constipation.    CONSTIPATION: The constipation remains a problem. No complications from the medications presently being used. Patient complains of constipation.  HYPERLIPIDEMIA: No complications from the medications presently being used.    PAST MEDICAL HISTORY : Reviewed.  No changes.  CURRENT MEDICATIONS: Reviewed per [facility]  REVIEW OF SYSTEMS:  GENERAL: no change in appetite, no fatigue, no weight changes, no fever, chills or weakness RESPIRATORY: no cough, SOB, DOE, wheezing, hemoptysis CARDIAC: no chest pain, edema or palpitations GI: no abdominal pain, diarrhea, heart burn, nausea or vomiting, + constipation   PHYSICAL EXAMINATION  VS:  T99.6       P72      RR20      BP128/62     POX99 %     WT167.4 (Lb)  GENERAL: no acute distress EYES: conjunctivae normal, sclerae normal, normal eye lids NECK: supple, trachea midline, no neck masses, no thyroid tenderness, no thyromegaly LYMPHATICS: no LAN in the neck, no supraclavicular LAN RESPIRATORY: breathing is even & unlabored, BS CTAB CARDIAC: RRR, no murmur,no extra heart sounds, no edema GI: abdomen soft, normal BS, no masses, no tenderness, no hepatomegaly, no splenomegaly PSYCHIATRIC: the patient is alert & oriented to  person, affect & behavior appropriate  LABS/RADIOLOGY: 12/27/12 WBC 10.8 hemoglobin 10.4 hematocrit 31.6 sodium 139 potassium 5.3 glucose 122 BUN 18 creatinine 0.74 calcium 9.0   ASSESSMENT/PLAN:  Left knee DJD status post left total knee arthroplasty - for rehabilitation  Hypothyroidism - continue Synthroid  Hyperlipidemia - continue Lipitor  Anemia - stable  Constipation - continue Colace and Miralax 17 g last 6-8 ounces liquid by mouth daily    CPT CODE: 13086

## 2013-01-14 NOTE — Progress Notes (Signed)
Patient ID: Whitney Medina, female   DOB: [birth year], 79 y.o.   MRN: [id number]              PROGRESS NOTE  DATE: 01/09/2013   FACILITY: [facility]  LEVEL OF CARE: SNF (31)  Acute Visit  CHIEF COMPLAINT:  Discharge notes  HISTORY OF PRESENT ILLNESS: This is a 79 year old female who is for discharge home with home health PT and Nursing. DME: Rolling walker and bedside commode due to unsteady gait. She has been admitted to [facility] on 12/27/12 from [facility] with Left knee DJD S/P left total knee arthroplasty.  Patient was admitted to this facility for short-term rehabilitation after the patient's recent hospitalization.  Patient has completed SNF rehabilitation and therapy has cleared the patient for discharge.  Reassessment of ongoing problem(s):  ANEMIA: The anemia has been stable. The patient denies fatigue, melena or hematochezia. 12/14 hemoglobin 10.4  HYPOTHYROIDISM: The hypothyroidism remains stable. No complications noted from the medications presently being used.  The patient denies fatigue or constipation.   HYPERLIPIDEMIA: No complications from the medications presently being used.    PAST MEDICAL HISTORY : Reviewed.  No changes.  CURRENT MEDICATIONS: Reviewed per [facility]  REVIEW OF SYSTEMS:  GENERAL: no change in appetite, no fatigue, no weight changes, no fever, chills or weakness RESPIRATORY: no cough, SOB, DOE, wheezing, hemoptysis CARDIAC: no chest pain, edema or palpitations GI: no abdominal pain, diarrhea, constipation, heart burn, nausea or vomiting  PHYSICAL EXAMINATION  VS:  T 98.2       P 90       RR 20      BP 120/70      POX 96 %       WT 167.4 (Lb)  GENERAL: no acute distress NECK: supple, trachea midline, no neck masses, no thyroid tenderness, no thyromegaly LYMPHATICS: no LAN in the neck, no supraclavicular LAN RESPIRATORY: breathing is even & unlabored, BS CTAB CARDIAC: RRR, no murmur,no extra heart sounds, no  edema GI: abdomen soft, normal BS, no masses, no tenderness, no hepatomegaly, no splenomegaly PSYCHIATRIC: the patient is alert & oriented to person, affect & behavior appropriate  LABS/RADIOLOGY: 12/27/12 WBC 10.8 hemoglobin 10.4 hematocrit 31.6 sodium 139 potassium 5.3 glucose 122 BUN 18 creatinine 0.74 calcium 9.0   ASSESSMENT/PLAN:  Left knee DJD status post left total knee arthroplasty - for rehabilitation  Hypothyroidism - continue Synthroid  Hyperlipidemia - continue Lipitor  Anemia - stable  Constipation - continue Colace and MiraLax  I have filled out patient's discharge paperwork and written prescriptions.  Patient will receive home health PT and Nursing.  DME provided: Rolling walker and bedside commode  Total discharge time: Greater than 30 minutes Discharge time involved coordination of the discharge process with Child psychotherapist, nursing staff and therapy department. Medical justification for home health services/DME verified.  CPT CODE: 40981

## 2013-02-14 ENCOUNTER — Inpatient Hospital Stay (HOSPITAL_COMMUNITY)
Admission: EM | Admit: 2013-02-14 | Discharge: 2013-02-17 | DRG: 166 | Disposition: A | Discharge status: Home / Self Care | Payer: Medicare HMO | Attending: Internal Medicine | Admitting: Internal Medicine

## 2013-02-14 ENCOUNTER — Encounter (HOSPITAL_COMMUNITY): Payer: Self-pay | Admitting: Emergency Medicine

## 2013-02-14 ENCOUNTER — Emergency Department (HOSPITAL_COMMUNITY): Payer: Medicare HMO

## 2013-02-14 DIAGNOSIS — K5900 Constipation, unspecified: Secondary | ICD-10-CM

## 2013-02-14 DIAGNOSIS — Z7901 Long term (current) use of anticoagulants: Secondary | ICD-10-CM

## 2013-02-14 DIAGNOSIS — J9600 Acute respiratory failure, unspecified whether with hypoxia or hypercapnia: Secondary | ICD-10-CM | POA: Diagnosis present

## 2013-02-14 DIAGNOSIS — M1712 Unilateral primary osteoarthritis, left knee: Secondary | ICD-10-CM

## 2013-02-14 DIAGNOSIS — Z7982 Long term (current) use of aspirin: Secondary | ICD-10-CM

## 2013-02-14 DIAGNOSIS — I824Y9 Acute embolism and thrombosis of unspecified deep veins of unspecified proximal lower extremity: Secondary | ICD-10-CM | POA: Diagnosis present

## 2013-02-14 DIAGNOSIS — I2699 Other pulmonary embolism without acute cor pulmonale: Principal | ICD-10-CM

## 2013-02-14 DIAGNOSIS — M179 Osteoarthritis of knee, unspecified: Secondary | ICD-10-CM

## 2013-02-14 DIAGNOSIS — Z96659 Presence of unspecified artificial knee joint: Secondary | ICD-10-CM

## 2013-02-14 DIAGNOSIS — Z79899 Other long term (current) drug therapy: Secondary | ICD-10-CM

## 2013-02-14 DIAGNOSIS — M1710 Unilateral primary osteoarthritis, unspecified knee: Secondary | ICD-10-CM

## 2013-02-14 DIAGNOSIS — E7800 Pure hypercholesterolemia, unspecified: Secondary | ICD-10-CM

## 2013-02-14 DIAGNOSIS — E785 Hyperlipidemia, unspecified: Secondary | ICD-10-CM | POA: Diagnosis present

## 2013-02-14 DIAGNOSIS — I10 Essential (primary) hypertension: Secondary | ICD-10-CM

## 2013-02-14 DIAGNOSIS — D62 Acute posthemorrhagic anemia: Secondary | ICD-10-CM

## 2013-02-14 DIAGNOSIS — E039 Hypothyroidism, unspecified: Secondary | ICD-10-CM | POA: Diagnosis present

## 2013-02-14 LAB — CBC
HCT: 41.7 % (ref 36.0–46.0)
Hemoglobin: 13.7 g/dL (ref 12.0–15.0)
MCH: 30.8 pg (ref 26.0–34.0)
MCHC: 32.9 g/dL (ref 30.0–36.0)
MCV: 93.7 fL (ref 78.0–100.0)
PLATELETS: 223 10*3/uL (ref 150–400)
RBC: 4.45 MIL/uL (ref 3.87–5.11)
RDW: 12.9 % (ref 11.5–15.5)
WBC: 6 10*3/uL (ref 4.0–10.5)

## 2013-02-14 LAB — POCT I-STAT TROPONIN I: TROPONIN I, POC: 0 ng/mL (ref 0.00–0.08)

## 2013-02-14 LAB — COMPREHENSIVE METABOLIC PANEL
ALT: 16 U/L (ref 0–35)
AST: 25 U/L (ref 0–37)
Albumin: 3.6 g/dL (ref 3.5–5.2)
Alkaline Phosphatase: 80 U/L (ref 39–117)
BUN: 8 mg/dL (ref 6–23)
CALCIUM: 9 mg/dL (ref 8.4–10.5)
CO2: 25 mEq/L (ref 19–32)
CREATININE: 0.69 mg/dL (ref 0.50–1.10)
Chloride: 104 mEq/L (ref 96–112)
GFR calc non Af Amer: 81 mL/min — ABNORMAL LOW (ref >90)
GLUCOSE: 183 mg/dL — AB (ref 70–99)
Potassium: 4.2 mEq/L (ref 3.7–5.3)
SODIUM: 144 meq/L (ref 137–147)
TOTAL PROTEIN: 6.7 g/dL (ref 6.0–8.3)
Total Bilirubin: 0.3 mg/dL (ref 0.3–1.2)

## 2013-02-14 LAB — PROTIME-INR
INR: 1 (ref 0.00–1.49)
Prothrombin Time: 13 seconds (ref 11.6–15.2)

## 2013-02-14 MED ORDER — IOHEXOL 350 MG/ML SOLN
100.0000 mL | Freq: Once | INTRAVENOUS | Status: AC | PRN
  Administered 2013-02-14: 100 mL via INTRAVENOUS

## 2013-02-14 MED ORDER — VITAMIN C 250 MG PO TABS
250.0000 mg | ORAL_TABLET | Freq: Every day | ORAL | Status: DC
  Administered 2013-02-15 – 2013-02-17 (×3): 250 mg via ORAL
  Filled 2013-02-14 (×3): qty 1

## 2013-02-14 MED ORDER — PANTOPRAZOLE SODIUM 40 MG PO TBEC
40.0000 mg | DELAYED_RELEASE_TABLET | Freq: Every day | ORAL | Status: DC
Start: 2013-02-14 — End: 2013-02-17
  Administered 2013-02-15 – 2013-02-17 (×3): 40 mg via ORAL
  Filled 2013-02-14 (×3): qty 1

## 2013-02-14 MED ORDER — LEVOTHYROXINE SODIUM 100 MCG PO TABS
100.0000 ug | ORAL_TABLET | Freq: Every day | ORAL | Status: DC
Start: 2013-02-15 — End: 2013-02-17
  Administered 2013-02-15 – 2013-02-17 (×3): 100 ug via ORAL
  Filled 2013-02-14 (×4): qty 1

## 2013-02-14 MED ORDER — HEPARIN (PORCINE) IN NACL 100-0.45 UNIT/ML-% IJ SOLN
1200.0000 [IU]/h | INTRAMUSCULAR | Status: DC
  Administered 2013-02-14: 1200 [IU]/h via INTRAVENOUS
  Filled 2013-02-14 (×3): qty 250

## 2013-02-14 MED ORDER — ONDANSETRON HCL 4 MG PO TABS
4.0000 mg | ORAL_TABLET | Freq: Four times a day (QID) | ORAL | Status: DC | PRN
  Administered 2013-02-16: 4 mg via ORAL
  Filled 2013-02-14: qty 1

## 2013-02-14 MED ORDER — ATORVASTATIN CALCIUM 10 MG PO TABS
10.0000 mg | ORAL_TABLET | Freq: Every day | ORAL | Status: DC
  Administered 2013-02-14 – 2013-02-16 (×3): 10 mg via ORAL
  Filled 2013-02-14 (×4): qty 1

## 2013-02-14 MED ORDER — LORATADINE 10 MG PO TABS
10.0000 mg | ORAL_TABLET | Freq: Every day | ORAL | Status: DC
  Administered 2013-02-15 – 2013-02-17 (×3): 10 mg via ORAL
  Filled 2013-02-14 (×3): qty 1

## 2013-02-14 MED ORDER — HEPARIN BOLUS VIA INFUSION
4000.0000 [IU] | Freq: Once | INTRAVENOUS | Status: AC
  Administered 2013-02-14: 4000 [IU] via INTRAVENOUS
  Filled 2013-02-14: qty 4000

## 2013-02-14 MED ORDER — HYDROMORPHONE HCL PF 1 MG/ML IJ SOLN: 1.0000 mg | INTRAMUSCULAR | Status: DC | PRN

## 2013-02-14 MED ORDER — OXYCODONE HCL 5 MG PO TABS
5.0000 mg | ORAL_TABLET | ORAL | Status: DC | PRN
  Administered 2013-02-14 – 2013-02-17 (×6): 5 mg via ORAL
  Filled 2013-02-14 (×6): qty 1

## 2013-02-14 MED ORDER — SODIUM CHLORIDE 0.9 % IJ SOLN
3.0000 mL | Freq: Two times a day (BID) | INTRAMUSCULAR | Status: DC
  Administered 2013-02-15 – 2013-02-16 (×2): 3 mL via INTRAVENOUS

## 2013-02-14 MED ORDER — ONDANSETRON HCL 4 MG/2ML IJ SOLN: 4.0000 mg | Freq: Four times a day (QID) | INTRAMUSCULAR | Status: DC | PRN

## 2013-02-14 NOTE — Progress Notes (Signed)
ANTICOAGULATION CONSULT NOTE - Initial Consult  Pharmacy Consult for heparin Indication: pulmonary embolus  Allergies  Allergen Reactions  . Codeine Nausea And Vomiting    Percocet OK    Patient Measurements:   Heparin Dosing Weight: 75 kg  Vital Signs: Temp: 98 F (36.7 C) (01/28 1650) Temp src: Oral (01/28 1650) BP: 118/61 mmHg (01/28 1650) Pulse Rate: 88 (01/28 1650)  Labs:  Recent Labs  02/14/13 1508  HGB 13.7  HCT 41.7  PLT 223  LABPROT 13.0  INR 1.00  CREATININE 0.69    The CrCl is unknown because both a height and weight (above a minimum accepted value) are required for this calculation.   Medical History: Past Medical History  Diagnosis Date  . Hypothyroidism   . Hypercholesteremia   . Left knee DJD   . PONV (postoperative nausea and vomiting)   . History of anemia     Medications:  See home med list  Assessment: Patient is s/p left TKA on 12/27/12 now found to have a pulmonary embolus.  IV heparin to start per pharmacy protocol Goal of Therapy:  Heparin level 0.3-0.7 units/ml Monitor platelets by anticoagulation protocol: Yes   Plan:  Give 4000 units bolus x 1 Start heparin infusion at 1200 units/hr Check anti-Xa level in 8 hours and daily while on heparin Continue to monitor H&H and platelets   Candie Mile 02/14/2013,6:13 PM

## 2013-02-14 NOTE — H&P (Addendum)
[facility] History and Physical  DESARE DUDDY WNI:[id number] DOB: [birth year] DOA: 02/14/2013  Referring physician: ED physician PCP: Tivis Ringer, MD   Chief Complaint: shortness of breath   HPI:  Patient is 80 yo female with hypothyroidism, HLD, left knee replacement 7 weeks ago who presented to [facility] ED with main concern of several weeks duration of progressively worsening shortness of breath, worse with exertion and somewhat improved with rest. Per pt, this appears to have started ager she had left knee replacement. She went to see her doctor's office today and had a elevated d-dimer at 4.34. Patient denies fevers, chills, no specific symptoms such as chest pain, no cough, no abdominal or urinary concerns.   In ED, CT angio consistent with pulmonary emboli. [facility] asked to admit for further management.   Assessment and Plan: Active Problems:   Acute respiratory failure  - secondary to pulmonary emboli - will admit to telemetry bed - place on heparin per pharmacy   Hypothyroidism - will continue synthroid per home medical regimen    Code Status: Full Family Communication: Pt at bedside Disposition Plan: Admit to telemetry bed    Review of Systems:  Constitutional: Negative for fever, chills. Negative for diaphoresis.  HENT: Negative for hearing loss, ear pain, nosebleeds, congestion, sore throat, neck pain, tinnitus and ear discharge.   Eyes: Negative for blurred vision, double vision, photophobia, pain, discharge and redness.  Respiratory: Negative for wheezing and stridor.   Cardiovascular: Negative for chest pain, palpitations, orthopnea, claudication and leg swelling.  Gastrointestinal: Negative for nausea, vomiting and abdominal pain. Negative for heartburn, constipation, blood in stool and melena.  Genitourinary: Negative for dysuria, urgency, frequency, hematuria and flank pain.  Musculoskeletal: Negative for myalgias, back pain, joint pain and falls.  Skin:  Negative for itching and rash.  Neurological: Negative for tingling, tremors, sensory change, speech change, focal weakness, loss of consciousness and headaches.  Endo/Heme/Allergies: Negative for environmental allergies and polydipsia. Does not bruise/bleed easily.  Psychiatric/Behavioral: Negative for suicidal ideas. The patient is not nervous/anxious.      Past Medical History  Diagnosis Date  . Hypothyroidism   . Hypercholesteremia   . Left knee DJD   . PONV (postoperative nausea and vomiting)   . History of anemia     Past Surgical History  Procedure Laterality Date  . Abdominal hysterectomy  1973  . Tonsillectomy  1961  . Shoulder adhesion release Left 2000  . Knee arthroscopy Left   . Total knee arthroplasty Left 12/25/2012    Procedure: TOTAL KNEE ARTHROPLASTY- left;  Surgeon: Lorn Junes, MD;  Location: [place];  Service: Orthopedics;  Laterality: Left;    Social History:  reports that she has never smoked. She does not have any smokeless tobacco history on file. She reports that she drinks alcohol. She reports that she does not use illicit drugs.  Allergies  Allergen Reactions  . Codeine Nausea And Vomiting    Percocet OK    Family History  Problem Relation Age of Onset  . Cancer - Other Mother   . Melanoma Sister   . Heart disease Sister     Prior to Admission medications   Medication Sig Start Date End Date Taking? Authorizing Provider  aspirin 325 MG EC tablet 1 tab a day for the next 30 days to prevent blood clots 12/27/12  Yes Kirstin J Shepperson, PA-C  atorvastatin (LIPITOR) 10 MG tablet Take 10 mg by mouth daily.   Yes Historical Provider, MD  Calcium  Carb-Cholecalciferol (CALCIUM 1000 + D PO) Take 1 tablet by mouth daily.   Yes Historical Provider, MD  Cholecalciferol (VITAMIN D) 2000 UNITS CAPS Take 1 capsule by mouth daily.   Yes Historical Provider, MD  levothyroxine (SYNTHROID, LEVOTHROID) 100 MCG tablet Take 100 mcg by mouth daily before  breakfast.   Yes Historical Provider, MD  loratadine (CLARITIN) 10 MG tablet Take 10 mg by mouth daily.   Yes Historical Provider, MD  Multiple Vitamins-Minerals (MULTIVITAMIN WITH MINERALS) tablet Take 1 tablet by mouth daily.   Yes Historical Provider, MD  oxyCODONE (OXY IR/ROXICODONE) 5 MG immediate release tablet 1-2 tablets every 4-6 hrs as needed for pain 12/27/12  Yes Kirstin J Shepperson, PA-C  vitamin C (ASCORBIC ACID) 500 MG tablet Take 250 mg by mouth daily.   Yes Historical Provider, MD    Physical Exam: Filed Vitals:   02/14/13 1503 02/14/13 1507 02/14/13 1650  BP: 137/75  118/61  Pulse: 114  88  Temp:  97.6 F (36.4 C) 98 F (36.7 C)  TempSrc:   Oral  Resp:   16  SpO2: 99%  98%    Physical Exam  Constitutional: Appears well-developed and well-nourished. No distress.  HENT: Normocephalic. External right and left ear normal. Oropharynx is clear and moist.  Eyes: Conjunctivae and EOM are normal. PERRLA, no scleral icterus.  Neck: Normal ROM. Neck supple. No JVD. No tracheal deviation. No thyromegaly.  CVS: RRR, S1/S2 +, no murmurs, no gallops, no carotid bruit.  Pulmonary: Effort and breath sounds normal, no stridor, rhonchi, diminished breath sounds at bases Abdominal: Soft. BS +,  no distension, tenderness, rebound or guarding.  Musculoskeletal: Normal range of motion. No edema and no tenderness.  Lymphadenopathy: No lymphadenopathy noted, cervical, inguinal. Neuro: Alert. Normal reflexes, muscle tone coordination. No cranial nerve deficit. Skin: Skin is warm and dry. No rash noted. Not diaphoretic. No erythema. No pallor.  Psychiatric: Normal mood and affect. Behavior, judgment, thought content normal.   Labs on Admission:  Basic Metabolic Panel:  Recent Labs Lab 02/14/13 1508  NA 144  K 4.2  CL 104  CO2 25  GLUCOSE 183*  BUN 8  CREATININE 0.69  CALCIUM 9.0   Liver Function Tests:  Recent Labs Lab 02/14/13 1508  AST 25  ALT 16  ALKPHOS 80   BILITOT 0.3  PROT 6.7  ALBUMIN 3.6   CBC:  Recent Labs Lab 02/14/13 1508  WBC 6.0  HGB 13.7  HCT 41.7  MCV 93.7  PLT 223   Radiological Exams on Admission: Ct Angio Chest Pe W/cm &/or Wo Cm   02/14/2013   Numerous bilateral pulmonary emboli. Abnormal RV/ LV ratio consistent with right heart strain.    EKG: Normal sinus rhythm, no ST/T wave changes  Faye Ramsay, MD  [facility] Pager [phone]  If 7PM-7AM, please contact night-coverage [email or web address] Password [facility] 02/14/2013, 6:25 PM

## 2013-02-14 NOTE — ED Notes (Signed)
Pt returned from CT, IV infiltrated during study. Per radiologists Pt received 12 cc of contrast. Infiltration is 2 inched above IV site on distal biceps Borders mark, Ice bag in place prior to returning to ED

## 2013-02-14 NOTE — ED Provider Notes (Signed)
CSN: [id number]     Arrival date & time 02/14/13  1455 History   First MD Initiated Contact with Patient 02/14/13 1525     Chief Complaint  Patient presents with  . Shortness of Breath   (Consider location/radiation/quality/duration/timing/severity/associated sxs/prior Treatment) Patient is a 80 y.o. female presenting with shortness of breath. The history is provided by the patient.  Shortness of Breath  Patient here complaining of dyspnea times several weeks after she had a left knee replacement. Went to her doctor's office today and had a elevated d-dimer at 4.34. Patient transported here to rule out pulmonary embolism. She denies any pleuritic pain or any anginal type pain. She does note some increased pain at her left knee where she had surgery. No syncope or near-syncope. Dyspnea is worse with exertion but denies any orthopnea. No cough or congestion. No fever or chills. No prior history of DVT Past Medical History  Diagnosis Date  . Hypothyroidism   . Hypercholesteremia   . Left knee DJD   . PONV (postoperative nausea and vomiting)   . History of anemia    Past Surgical History  Procedure Laterality Date  . Abdominal hysterectomy  1973  . Tonsillectomy  1961  . Shoulder adhesion release Left 2000  . Knee arthroscopy Left   . Total knee arthroplasty Left 12/25/2012    Procedure: TOTAL KNEE ARTHROPLASTY- left;  Surgeon: Lorn Junes, MD;  Location: [place];  Service: Orthopedics;  Laterality: Left;   Family History  Problem Relation Age of Onset  . Cancer - Other Mother   . Melanoma Sister   . Heart disease Sister    History  Substance Use Topics  . Smoking status: Never Smoker   . Smokeless tobacco: Not on file  . Alcohol Use: Yes     Comment: Rare glass of wine   OB History   Grav Para Term Preterm Abortions TAB SAB Ect Mult Living                 Review of Systems  Respiratory: Positive for shortness of breath.   All other systems reviewed and are  negative.    Allergies  Codeine  Home Medications   Current Outpatient Rx  Name  Route  Sig  Dispense  Refill  . aspirin 325 MG EC tablet      1 tab a day for the next 30 days to prevent blood clots   30 tablet   0   . atorvastatin (LIPITOR) 10 MG tablet   Oral   Take 10 mg by mouth daily.         . bisacodyl (DULCOLAX) 5 MG EC tablet      Take 2 tablets every night with dinner until bowel movement.  LAXITIVE.  Restart if two days since last bowel movement   30 tablet   0   . Calcium Carb-Cholecalciferol (CALCIUM 1000 + D PO)   Oral   Take 1 tablet by mouth daily.         . celecoxib (CELEBREX) 200 MG capsule      1 tab po q day with food for pain and  swelling   30 capsule   0   . Cholecalciferol (VITAMIN D) 2000 UNITS CAPS   Oral   Take 1 capsule by mouth daily.         Marland Kitchen docusate sodium 100 MG CAPS      1 tab 2 times a day while on narcotics.  STOOL SOFTENER  60 capsule   0   . levothyroxine (SYNTHROID, LEVOTHROID) 100 MCG tablet   Oral   Take 100 mcg by mouth daily before breakfast.         . loratadine (CLARITIN) 10 MG tablet   Oral   Take 10 mg by mouth daily.         . Multiple Vitamins-Minerals (MULTIVITAMIN WITH MINERALS) tablet   Oral   Take 1 tablet by mouth daily.         Marland Kitchen oxyCODONE (OXY IR/ROXICODONE) 5 MG immediate release tablet      1-2 tablets every 4-6 hrs as needed for pain   100 tablet   0   . vitamin C (ASCORBIC ACID) 500 MG tablet   Oral   Take 250 mg by mouth daily.          BP 137/75  Pulse 114  Temp(Src) 97.6 F (36.4 C)  SpO2 99% Physical Exam  Nursing note and vitals reviewed. Constitutional: She is oriented to person, place, and time. She appears well-developed and well-nourished.  Non-toxic appearance. No distress.  HENT:  Head: Normocephalic and atraumatic.  Eyes: Conjunctivae, EOM and lids are normal. Pupils are equal, round, and reactive to light.  Neck: Normal range of motion. Neck  supple. No tracheal deviation present. No mass present.  Cardiovascular: Regular rhythm and normal heart sounds.  Tachycardia present.  Exam reveals no gallop.   No murmur heard. Pulmonary/Chest: Effort normal and breath sounds normal. No stridor. No respiratory distress. She has no decreased breath sounds. She has no wheezes. She has no rhonchi. She has no rales.  Abdominal: Soft. Normal appearance and bowel sounds are normal. She exhibits no distension. There is no tenderness. There is no rebound and no CVA tenderness.  Musculoskeletal: Normal range of motion. She exhibits no edema and no tenderness.  Neurological: She is alert and oriented to person, place, and time. She has normal strength. No cranial nerve deficit or sensory deficit. GCS eye subscore is 4. GCS verbal subscore is 5. GCS motor subscore is 6.  Skin: Skin is warm and dry. No abrasion and no rash noted.  Psychiatric: She has a normal mood and affect. Her speech is normal and behavior is normal.    ED Course  Procedures (including critical care time) Labs Review Labs Reviewed  CBC  PROTIME-INR  COMPREHENSIVE METABOLIC PANEL   Imaging Review No results found.  EKG Interpretation   None       MDM  No diagnosis found. Patient's CT scan consistent with bilateral pulmonary embolus. Have been ordered per pharmacy. Patient to be admitted to telemetry.    Leota Jacobsen, MD 02/14/13 [phone]

## 2013-02-14 NOTE — ED Notes (Signed)
sobsince left knee replacement  7 weeks ago dec 8th went to dr yesterrday and he did ekg xray and blood work was called told she had ? Blood clot

## 2013-02-15 ENCOUNTER — Encounter (HOSPITAL_COMMUNITY): Payer: Self-pay | Admitting: Radiology

## 2013-02-15 ENCOUNTER — Inpatient Hospital Stay (HOSPITAL_COMMUNITY): Payer: Medicare HMO

## 2013-02-15 ENCOUNTER — Other Ambulatory Visit: Payer: Self-pay | Admitting: Interventional Radiology

## 2013-02-15 DIAGNOSIS — R0602 Shortness of breath: Secondary | ICD-10-CM

## 2013-02-15 DIAGNOSIS — I2699 Other pulmonary embolism without acute cor pulmonale: Secondary | ICD-10-CM

## 2013-02-15 LAB — BASIC METABOLIC PANEL
BUN: 7 mg/dL (ref 6–23)
CHLORIDE: 110 meq/L (ref 96–112)
CO2: 25 meq/L (ref 19–32)
Calcium: 8.4 mg/dL (ref 8.4–10.5)
Creatinine, Ser: 0.75 mg/dL (ref 0.50–1.10)
GFR calc Af Amer: >90 mL/min (ref >90)
GFR calc non Af Amer: 79 mL/min — ABNORMAL LOW (ref >90)
GLUCOSE: 90 mg/dL (ref 70–99)
POTASSIUM: 4.2 meq/L (ref 3.7–5.3)
SODIUM: 147 meq/L (ref 137–147)

## 2013-02-15 LAB — CBC
HCT: 35.2 % — ABNORMAL LOW (ref 36.0–46.0)
Hemoglobin: 11.5 g/dL — ABNORMAL LOW (ref 12.0–15.0)
MCH: 30.7 pg (ref 26.0–34.0)
MCHC: 32.7 g/dL (ref 30.0–36.0)
MCV: 93.9 fL (ref 78.0–100.0)
PLATELETS: 173 10*3/uL (ref 150–400)
RBC: 3.75 MIL/uL — AB (ref 3.87–5.11)
RDW: 13 % (ref 11.5–15.5)
WBC: 5.6 10*3/uL (ref 4.0–10.5)

## 2013-02-15 LAB — HEPARIN LEVEL (UNFRACTIONATED): HEPARIN UNFRACTIONATED: 0.38 [IU]/mL (ref 0.30–0.70)

## 2013-02-15 MED ORDER — IOHEXOL 300 MG/ML  SOLN
100.0000 mL | Freq: Once | INTRAMUSCULAR | Status: AC | PRN
  Administered 2013-02-15: 50 mL via INTRAVENOUS

## 2013-02-15 MED ORDER — FENTANYL CITRATE 0.05 MG/ML IJ SOLN
INTRAMUSCULAR | Status: AC | PRN
  Administered 2013-02-15: 50 ug via INTRAVENOUS

## 2013-02-15 MED ORDER — RIVAROXABAN 15 MG PO TABS: 15.0000 mg | ORAL_TABLET | Freq: Two times a day (BID) | ORAL | Status: DC

## 2013-02-15 MED ORDER — FENTANYL CITRATE 0.05 MG/ML IJ SOLN
INTRAMUSCULAR | Status: AC
  Filled 2013-02-15: qty 2

## 2013-02-15 MED ORDER — RIVAROXABAN 15 MG PO TABS
15.0000 mg | ORAL_TABLET | Freq: Two times a day (BID) | ORAL | Status: DC
  Administered 2013-02-15 – 2013-02-17 (×5): 15 mg via ORAL
  Filled 2013-02-15 (×7): qty 1

## 2013-02-15 MED ORDER — RIVAROXABAN 20 MG PO TABS: 20.0000 mg | ORAL_TABLET | Freq: Every day | ORAL | Status: DC

## 2013-02-15 NOTE — ED Notes (Signed)
Dr Geroge Baseman requests Fentanyl 50 mcg

## 2013-02-15 NOTE — Progress Notes (Signed)
Patient Demographics  Whitney Medina, is a 80 y.o. female, DOB - [birth year], QIW:[id number]  Admit date - 02/14/2013   Admitting Physician Theodis Blaze, MD  Outpatient Primary MD for the patient is Tivis Ringer, MD  LOS - 1   Chief Complaint  Patient presents with  . Shortness of Breath        Assessment & Plan    1. Pulmonary embolism - With a large clot burden, recent left knee surgery with a positive popliteal DVT in the left leg with a mobile tip, she is on anticoagulation with xaralto case discussed with pulmonary physician Dr. Nelda Marseille who agrees with placement of a retrievable IVC filter in the light of large clot burden in the lungs and a mobile DVT in the left lower extremity. Explained to the patient in detail she is acceptable with the plan.    2.Hypothyroidism. Continue home dose Synthroid.    3.Dyslipidemia. Continue home dose statin.    4. Recent left knee surgery. Post discharge follow with Orthopedics.     Code Status: Full   Family Communication:   Disposition Plan: Home   Procedures CTA, Lower Leg [place]   Consults  IR   Medications  Scheduled Meds: . atorvastatin  10 mg Oral q1800  . levothyroxine  100 mcg Oral QAC breakfast  . loratadine  10 mg Oral Daily  . pantoprazole  40 mg Oral Daily  . Rivaroxaban  15 mg Oral BID WC  . [START ON 03/08/2013] Rivaroxaban  20 mg Oral Q supper  . sodium chloride  3 mL Intravenous Q12H  . vitamin C  250 mg Oral Daily   Continuous Infusions:  PRN Meds:.HYDROmorphone (DILAUDID) injection, ondansetron (ZOFRAN) IV, ondansetron, oxyCODONE  DVT Prophylaxis  Xaralto  Lab Results  Component Value Date   PLT 173 02/15/2013    Antibiotics     Anti-infectives   None          Subjective:   [name] today has, No headache, No chest pain, No abdominal pain - No Nausea, No new weakness tingling or numbness, No Cough - SOB.    Objective:   Filed Vitals:   02/15/13 0427 02/15/13 0627 02/15/13 1225 02/15/13 1230  BP: 98/52  137/70 151/61  Pulse: 71  90 100  Temp: 98 F (36.7 C)  97.8 F (36.6 C)   TempSrc: Oral  Oral   Resp: 16  18 20   Height:      Weight:  71.26 kg (157 lb 1.6 oz)    SpO2: 95%  100% 100%    Wt Readings from Last 3 Encounters:  02/15/13 71.26 kg (157 lb 1.6 oz)  12/26/12 75.297 kg (166 lb)  12/26/12 75.297 kg (166 lb)     Intake/Output Summary (Last 24 hours) at 02/15/13 1413 Last data filed at 02/15/13 0700  Gross per 24 hour  Intake    240 ml  Output      0 ml  Net    240 ml    Exam Awake Alert, Oriented X 3, No new F.N deficits, Normal affect [place].AT,PERRAL Supple Neck,No JVD, No cervical lymphadenopathy appriciated.  Symmetrical Chest wall movement, Good air movement bilaterally, CTAB RRR,No Gallops,Rubs or new Murmurs, No Parasternal Heave +ve  B.Sounds, Abd Soft, Non tender, No organomegaly appriciated, No rebound - guarding or rigidity. No Cyanosis, Clubbing or edema, No new Rash or bruise  L leg mildly swollen   Data Review   Micro Results No results found for this or any previous visit (from the past 240 hour(s)).  Radiology Reports Ct Angio Chest Pe W/cm &/or Wo Cm  02/14/2013   CLINICAL DATA:  Chest pain and shortness of breath.  EXAM: CT ANGIOGRAPHY CHEST WITH CONTRAST  TECHNIQUE: Multidetector CT imaging of the chest was performed using the standard protocol during bolus administration of intravenous contrast. Multiplanar CT image reconstructions including MIPs were obtained to evaluate the vascular anatomy.  CONTRAST:  127mL OMNIPAQUE IOHEXOL 350 MG/ML SOLN  COMPARISON:  Chest x-ray dated 12/18/2012  FINDINGS: There are multiple bilateral pulmonary emboli. RV/LV ratio is greater than 1 indicating right heart strain.  The emboli  extend into all lobes of both lungs.  Heart size is normal. No infiltrates or effusions. No acute osseous abnormality.  Review of the MIP images confirms the above findings.  IMPRESSION: Numerous bilateral pulmonary emboli. Abnormal RV/ LV ratio consistent with right heart strain.   Electronically Signed   By: Rozetta Nunnery M.D.   On: 02/14/2013 17:43    CBC  Recent Labs Lab 02/14/13 1508 02/15/13 0336  WBC 6.0 5.6  HGB 13.7 11.5*  HCT 41.7 35.2*  PLT 223 173  MCV 93.7 93.9  MCH 30.8 30.7  MCHC 32.9 32.7  RDW 12.9 13.0    Chemistries   Recent Labs Lab 02/14/13 1508 02/15/13 0336  NA 144 147  K 4.2 4.2  CL 104 110  CO2 25 25  GLUCOSE 183* 90  BUN 8 7  CREATININE 0.69 0.75  CALCIUM 9.0 8.4  AST 25  --   ALT 16  --   ALKPHOS 80  --   BILITOT 0.3  --    ------------------------------------------------------------------------------------------------------------------ estimated creatinine clearance is 60.6 ml/min (by C-G formula based on Cr of 0.75). ------------------------------------------------------------------------------------------------------------------ No results found for this basename: HGBA1C,  in the last 72 hours ------------------------------------------------------------------------------------------------------------------ No results found for this basename: CHOL, HDL, LDLCALC, TRIG, CHOLHDL, LDLDIRECT,  in the last 72 hours ------------------------------------------------------------------------------------------------------------------ No results found for this basename: TSH, T4TOTAL, FREET3, T3FREE, THYROIDAB,  in the last 72 hours ------------------------------------------------------------------------------------------------------------------ No results found for this basename: VITAMINB12, FOLATE, FERRITIN, TIBC, IRON, RETICCTPCT,  in the last 72 hours  Coagulation profile  Recent Labs Lab 02/14/13 1508  INR 1.00    No results found for this  basename: DDIMER,  in the last 72 hours  Cardiac Enzymes No results found for this basename: CK, CKMB, TROPONINI, MYOGLOBIN,  in the last 168 hours ------------------------------------------------------------------------------------------------------------------ No components found with this basename: POCBNP,      Time Spent in minutes   35   SINGH,PRASHANT K M.D on 02/15/2013 at 2:13 PM  Between 7am to 7pm - Pager - [phone]  After 7pm go to [email or web address] - password [email or web address]  And look for the night coverage person covering for me after hours  [facility] Office  [phone]

## 2013-02-15 NOTE — Consult Note (Addendum)
Pharmacy Note-Anticoagulation  Pharmacy Consult :  80 y.o. female is currently on IV Heparin for multiple bilateral PE's.  Heparin is to be transitioned to Xarelto..   Latest Labs : Hematology :  Recent Labs  02/14/13 1508 02/15/13 0336  HGB 13.7 11.5*  HCT 41.7 35.2*  PLT 223 173  LABPROT 13.0  --   INR 1.00  --   HEPARINUNFRC  --  0.38  CREATININE 0.69 0.75   Estimated Creatinine Clearance: 60.6 ml/min (by C-G formula based on Cr of 0.75).   Current Medication[s] Include: Scheduled:  Scheduled:  . atorvastatin  10 mg Oral q1800  . levothyroxine  100 mcg Oral QAC breakfast  . loratadine  10 mg Oral Daily  . pantoprazole  40 mg Oral Daily  . sodium chloride  3 mL Intravenous Q12H  . vitamin C  250 mg Oral Daily   Infusion[s]: Infusions:  . heparin 1,200 Units/hr (02/14/13 1853)   Assessment :  80 y/o F with multiple bilateral PE's on IV Heparin to be transitioned to Xarelto.  CrCl 60.5 ml/min.  No renal adjustments will be required  No bleeding complications observed.  Goal :  Xarelto dosed for treatment of pulmonary embolisms.  Plan : 1. Discontinue Heparin now. 2. Begin Xarelto 15 mg po BID with meals x 21 days, then Xarelto 20 mg daily with supper. 3. CBC q 3 days.  Monitor for bleeding complications  4. Xarelto discharge teaching completed.  Elizet Kaplan, Craig Guess, Pharm.D. 02/15/2013  8:31 AM

## 2013-02-15 NOTE — Care Management Note (Unsigned)
    Page 1 of 1   02/15/2013     4:50:34 PM   CARE MANAGEMENT NOTE 02/15/2013  Patient:  NUVIA, HILEMAN   Account Number:  [id number]  Date Initiated:  02/15/2013  Documentation initiated by:  Kataryna Mcquilkin  Subjective/Objective Assessment:   PT ADM ON 1/28 WITH LARGE PE. PTA, PT INDEPENDENT, LIVES ALONE.     Action/Plan:   PT TO DC ON XARELTO.  WILL CHECK BENEFITS.  FREE 30 DAY CARD GIVEN TO PT, WHICH HAS BEEN ACTIVATED.   Anticipated DC Date:  02/17/2013   Anticipated DC Plan:  [place]  CM consult      Choice offered to / List presented to:             Status of service:  In process, will continue to follow Medicare Important Message given?   (If response is "NO", the following Medicare IM given date fields will be blank) Date Medicare IM given:   Date Additional Medicare IM given:    Discharge Disposition:    Per UR Regulation:  Reviewed for med. necessity/level of care/duration of stay  If discussed at [place] of Stay Meetings, dates discussed:    Comments:  J. Marina Boerner,RN,BSN [phone] XARELTO 15 MG  CO-PAY -  $11.51 XARELTO 20 MG FOR 30 DAYS  CO-PAY $ 45.00  NO PRIOR AUTHORIZATION NEEDED.

## 2013-02-15 NOTE — ED Notes (Signed)
For IVC filter, ate breakfast and lunch today.  No sedation, pt understands.  Procedure explained.

## 2013-02-15 NOTE — Progress Notes (Signed)
VASCULAR LAB PRELIMINARY  PRELIMINARY  PRELIMINARY  PRELIMINARY  Bilateral lower extremity venous duplex completed.    Preliminary report:  Right:  No evidence of DVT, superficial thrombosis, or Baker's cyst. Left - POSITIVE - MOBILE DVT OF THE ENTIRE POPLITEAL VEIN.  No evidence of superficial thrombosis.  No Baker's cyst.  Shalisa Mcquade, RVS 02/15/2013, 1:42 PM

## 2013-02-15 NOTE — Procedures (Signed)
Interventional Radiology Procedure Note  Procedure: Placement of potentially retrievable IVC filter. Access Vessel: Right IJ Complications: None Recommendations: - Bedrest x 2 hrs - Continue anticoagulation - Will follow in IR clinic to assess for filter retrieval -   Signed,  Criselda Peaches, MD [facility] Radiology

## 2013-02-15 NOTE — Progress Notes (Signed)
[place] for heparin Indication: pulmonary embolus  Allergies  Allergen Reactions  . Codeine Nausea And Vomiting    Percocet OK    Patient Measurements: Height: 5\' 9"  (175.3 cm) Weight: 157 lb 3 oz (71.3 kg) IBW/kg (Calculated) : 66.2 Heparin Dosing Weight: 75 kg  Vital Signs: Temp: 98 F (36.7 C) (01/29 0427) Temp src: Oral (01/29 0427) BP: 98/52 mmHg (01/29 0427) Pulse Rate: 71 (01/29 0427)  Labs:  Recent Labs  02/14/13 1508 02/15/13 0336  HGB 13.7 11.5*  HCT 41.7 35.2*  PLT 223 173  LABPROT 13.0  --   INR 1.00  --   HEPARINUNFRC  --  0.38  CREATININE 0.69  --     Estimated Creatinine Clearance: 60.6 ml/min (by C-G formula based on Cr of 0.69). Assessment: 80 yo female with PE for Heparin Goal of Therapy:  Heparin level 0.3-0.7 units/ml Monitor platelets by anticoagulation protocol: Yes   Plan:  Continue Heparin at current rate  Recheck level in 6 hrs to verify  Whitney Medina, Whitney Medina 02/15/2013,5:17 AM

## 2013-02-15 NOTE — Discharge Instructions (Signed)
Follow with Primary MD Tivis Ringer, MD in 4 days   Get CBC, CMP, checked 4 days by Primary MD and again as instructed by your Primary MD. Get a 2 view Chest X ray done next visit if you had Pneumonia of Lung problems at the Hospital.   Activity: As tolerated with Full fall precautions use walker/cane & assistance as needed   Disposition Home     Diet: Heart Healthy    For Heart failure patients - Check your Weight same time everyday, if you gain over 2 pounds, or you develop in leg swelling, experience more shortness of breath or chest pain, call your Primary MD immediately. Follow Cardiac Low Salt Diet and 1.8 lit/day fluid restriction.   On your next visit with her primary care physician please Get Medicines reviewed and adjusted.  Please request your Prim.MD to go over all Hospital Tests and Procedure/Radiological results at the follow up, please get all Hospital records sent to your Prim MD by signing hospital release before you go home.   If you experience worsening of your admission symptoms, develop shortness of breath, life threatening emergency, suicidal or homicidal thoughts you must seek medical attention immediately by calling 911 or calling your MD immediately  if symptoms less severe.  You Must read complete instructions/literature along with all the possible adverse reactions/side effects for all the Medicines you take and that have been prescribed to you. Take any new Medicines after you have completely understood and accpet all the possible adverse reactions/side effects.   Do not drive and provide baby sitting services if your were admitted for syncope or siezures until you have seen by Primary MD or a Neurologist and advised to do so again.  Do not drive when taking Pain medications.    Do not take more than prescribed Pain, Sleep and Anxiety Medications  Special Instructions: If you have smoked or chewed Tobacco  in the last 2 yrs please stop smoking, stop  any regular Alcohol  and or any Recreational drug use.  Wear Seat belts while driving.   Please note  You were cared for by a hospitalist during your hospital stay. If you have any questions about your discharge medications or the care you received while you were in the hospital after you are discharged, you can call the unit and asked to speak with the hospitalist on call if the hospitalist that took care of you is not available. Once you are discharged, your primary care physician will handle any further medical issues. Please note that NO REFILLS for any discharge medications will be authorized once you are discharged, as it is imperative that you return to your primary care physician (or establish a relationship with a primary care physician if you do not have one) for your aftercare needs so that they can reassess your need for medications and monitor your lab values.

## 2013-02-15 NOTE — Progress Notes (Signed)
Nutrition Brief Note  Patient identified on the Malnutrition Screening Tool (MST) Report for recent weight los without trying and eating poorly because of a decreased appetite.  Per readings below, patient has had a 5% weight loss since beginning of December; not significant for time frame.  Wt Readings from Last 15 Encounters:  02/15/13 157 lb 1.6 oz (71.26 kg)  12/26/12 166 lb (75.297 kg)  12/26/12 166 lb (75.297 kg)  12/18/12 166 lb 7.2 oz (75.5 kg)  12/12/12 166 lb (75.297 kg)    Body mass index is 23.19 kg/(m^2). Patient meets criteria for Normal based on current BMI.   Current diet order is Regular, patient is consuming approximately 75% of meals at this time. Labs and medications reviewed.   No nutrition interventions warranted at this time. If nutrition issues arise, please consult RD.   Arthur Holms, RD, LDN Pager #: [phone] After-Hours Pager #: [phone]

## 2013-02-15 NOTE — H&P (Signed)
Chief Complaint: "shortness of breath." Referring Physician: Dr. Candiss Norse HPI: Whitney Medina is an 80 y.o. female who states she has had recent left knee surgery 12/25/2012 she states since surgery she has not felt right with complaints of fatigue and nausea. She had an office appointment 2 days ago and realized she felt extremely short of breath, she presented to [facility] where she was found to have LLE DVT and large PE burden. IR has received request for IVC filter. She is on xarelto and denies any active bleeding or history of bleeding. She denies any fever, chills or chest pain. She denies any known kidney dysfunction or allergy to iodinated contrast.   Past Medical History:  Past Medical History  Diagnosis Date  . Hypothyroidism   . Hypercholesteremia   . Left knee DJD   . PONV (postoperative nausea and vomiting)   . History of anemia     Past Surgical History:  Past Surgical History  Procedure Laterality Date  . Abdominal hysterectomy  1973  . Tonsillectomy  1961  . Shoulder adhesion release Left 2000  . Knee arthroscopy Left   . Total knee arthroplasty Left 12/25/2012    Procedure: TOTAL KNEE ARTHROPLASTY- left;  Surgeon: Lorn Junes, MD;  Location: [place];  Service: Orthopedics;  Laterality: Left;    Family History:  Family History  Problem Relation Age of Onset  . Cancer - Other Mother   . Melanoma Sister   . Heart disease Sister     Social History:  reports that she has never smoked. She does not have any smokeless tobacco history on file. She reports that she drinks alcohol. She reports that she does not use illicit drugs.  Allergies:  Allergies  Allergen Reactions  . Codeine Nausea And Vomiting    Percocet OK    Medications:   Medication List    STOP taking these medications       aspirin 325 MG EC tablet      TAKE these medications       atorvastatin 10 MG tablet  Commonly known as:  LIPITOR  Take 10 mg by mouth daily.     CALCIUM 1000 + D PO  Take  1 tablet by mouth daily.     levothyroxine 100 MCG tablet  Commonly known as:  SYNTHROID, LEVOTHROID  Take 100 mcg by mouth daily before breakfast.     loratadine 10 MG tablet  Commonly known as:  CLARITIN  Take 10 mg by mouth daily.     multivitamin with minerals tablet  Take 1 tablet by mouth daily.     oxyCODONE 5 MG immediate release tablet  Commonly known as:  Oxy IR/ROXICODONE  1-2 tablets every 4-6 hrs as needed for pain     Rivaroxaban 15 MG Tabs tablet  Commonly known as:  XARELTO  Take 1 tablet (15 mg total) by mouth 2 (two) times daily with a meal.     vitamin C 500 MG tablet  Commonly known as:  ASCORBIC ACID  Take 250 mg by mouth daily.     Vitamin D 2000 UNITS Caps  Take 1 capsule by mouth daily.       Please HPI for pertinent positives, otherwise complete 10 system ROS negative.  Physical Exam: BP 151/61  Pulse 100  Temp(Src) 97.8 F (36.6 C) (Oral)  Resp 20  Ht 5' 9"  (1.753 m)  Wt 157 lb 1.6 oz (71.26 kg)  BMI 23.19 kg/m2  SpO2 100% Body mass index  is 23.19 kg/(m^2).  General Appearance:  Alert, cooperative, appears winded when talking.   Head:  Normocephalic, without obvious abnormality, atraumatic  Neck: Supple, symmetrical, trachea midline  Lungs:   Clear to auscultation bilaterally, no w/r/r, respirations unlabored without use of accessory muscles.  Chest Wall:  No tenderness or deformity  Heart:  Regular rate and rhythm, S1, S2 normal, no murmur, rub or gallop.  Abdomen:   Soft, non-tender, non distended ,(+) BS  Extremities: Extremities LLE 2+, RLE 1+, atraumatic, no cyanosis or edema  Pulses: 1+ and symmetric  Neurologic: Normal affect, no gross deficits.   Results for orders placed during the hospital encounter of 02/14/13 (from the past 48 hour(s))  CBC     Status: None   Collection Time    02/14/13  3:08 PM      Result Value Range   WBC 6.0  4.0 - 10.5 K/uL   RBC 4.45  3.87 - 5.11 MIL/uL   Hemoglobin 13.7  12.0 - 15.0 g/dL   HCT  41.7  36.0 - 46.0 %   MCV 93.7  78.0 - 100.0 fL   MCH 30.8  26.0 - 34.0 pg   MCHC 32.9  30.0 - 36.0 g/dL   RDW 12.9  11.5 - 15.5 %   Platelets 223  150 - 400 K/uL  PROTIME-INR     Status: None   Collection Time    02/14/13  3:08 PM      Result Value Range   Prothrombin Time 13.0  11.6 - 15.2 seconds   INR 1.00  0.00 - 1.49  COMPREHENSIVE METABOLIC PANEL     Status: Abnormal   Collection Time    02/14/13  3:08 PM      Result Value Range   Sodium 144  137 - 147 mEq/L   Potassium 4.2  3.7 - 5.3 mEq/L   Chloride 104  96 - 112 mEq/L   CO2 25  19 - 32 mEq/L   Glucose, Bld 183 (*) 70 - 99 mg/dL   BUN 8  6 - 23 mg/dL   Creatinine, Ser 0.69  0.50 - 1.10 mg/dL   Calcium 9.0  8.4 - 10.5 mg/dL   Total Protein 6.7  6.0 - 8.3 g/dL   Albumin 3.6  3.5 - 5.2 g/dL   AST 25  0 - 37 U/L   ALT 16  0 - 35 U/L   Alkaline Phosphatase 80  39 - 117 U/L   Total Bilirubin 0.3  0.3 - 1.2 mg/dL   GFR calc non Af Amer 81 (*) >90 mL/min   GFR calc Af Amer >90  >90 mL/min   Comment: (NOTE)     The eGFR has been calculated using the CKD EPI equation.     This calculation has not been validated in all clinical situations.     eGFR's persistently <90 mL/min signify possible Chronic Kidney     Disease.  POCT I-STAT TROPONIN I     Status: None   Collection Time    02/14/13  3:49 PM      Result Value Range   Troponin i, poc 0.00  0.00 - 0.08 ng/mL   Comment 3            Comment: Due to the release kinetics of cTnI,     a negative result within the first hours     of the onset of symptoms does not rule out     myocardial infarction with certainty.  If myocardial infarction is still suspected,     repeat the test at appropriate intervals.  HEPARIN LEVEL (UNFRACTIONATED)     Status: None   Collection Time    02/15/13  3:36 AM      Result Value Range   Heparin Unfractionated 0.38  0.30 - 0.70 IU/mL   Comment:            IF HEPARIN RESULTS ARE BELOW     EXPECTED VALUES, AND PATIENT     DOSAGE HAS  BEEN CONFIRMED,     SUGGEST FOLLOW UP TESTING     OF ANTITHROMBIN III LEVELS.  CBC     Status: Abnormal   Collection Time    02/15/13  3:36 AM      Result Value Range   WBC 5.6  4.0 - 10.5 K/uL   RBC 3.75 (*) 3.87 - 5.11 MIL/uL   Hemoglobin 11.5 (*) 12.0 - 15.0 g/dL   HCT 35.2 (*) 36.0 - 46.0 %   MCV 93.9  78.0 - 100.0 fL   MCH 30.7  26.0 - 34.0 pg   MCHC 32.7  30.0 - 36.0 g/dL   RDW 13.0  11.5 - 15.5 %   Platelets 173  150 - 400 K/uL  BASIC METABOLIC PANEL     Status: Abnormal   Collection Time    02/15/13  3:36 AM      Result Value Range   Sodium 147  137 - 147 mEq/L   Potassium 4.2  3.7 - 5.3 mEq/L   Chloride 110  96 - 112 mEq/L   CO2 25  19 - 32 mEq/L   Glucose, Bld 90  70 - 99 mg/dL   BUN 7  6 - 23 mg/dL   Creatinine, Ser 0.75  0.50 - 1.10 mg/dL   Calcium 8.4  8.4 - 10.5 mg/dL   GFR calc non Af Amer 79 (*) >90 mL/min   GFR calc Af Amer >90  >90 mL/min   Comment: (NOTE)     The eGFR has been calculated using the CKD EPI equation.     This calculation has not been validated in all clinical situations.     eGFR's persistently <90 mL/min signify possible Chronic Kidney     Disease.   Ct Angio Chest Pe W/cm &/or Wo Cm  02/14/2013   CLINICAL DATA:  Chest pain and shortness of breath.  EXAM: CT ANGIOGRAPHY CHEST WITH CONTRAST  TECHNIQUE: Multidetector CT imaging of the chest was performed using the standard protocol during bolus administration of intravenous contrast. Multiplanar CT image reconstructions including MIPs were obtained to evaluate the vascular anatomy.  CONTRAST:  141m OMNIPAQUE IOHEXOL 350 MG/ML SOLN  COMPARISON:  Chest x-ray dated 12/18/2012  FINDINGS: There are multiple bilateral pulmonary emboli. RV/LV ratio is greater than 1 indicating right heart strain.  The emboli extend into all lobes of both lungs.  Heart size is normal. No infiltrates or effusions. No acute osseous abnormality.  Review of the MIP images confirms the above findings.  IMPRESSION: Numerous  bilateral pulmonary emboli. Abnormal RV/ LV ratio consistent with right heart strain.   Electronically Signed   By: JRozetta NunneryM.D.   On: 02/14/2013 17:43    Assessment/Plan Shortness of breath. Acute LLE popliteal DVT with mobile tip, on xarelto. Large pulmonary embolus burden. Left knee surgery 12/2012. Hypothyroidism. Dyslipidemia. Request for image guided retrievable inferior vena cava filter placement. Risks and Benefits discussed with the patient. All of the patient's questions were answered,  patient is agreeable to proceed. Consent signed and in chart.   Tsosie Billing D PA-C 02/15/2013, 3:41 PM

## 2013-02-15 NOTE — Discharge Summary (Addendum)
Whitney Medina, is a 80 y.o. female  DOB 02-25-1932  MRN [id number].  Admission date:  02/14/2013  Admitting Physician  Theodis Blaze, MD  Discharge Date:  02/17/2013   Primary MD  Tivis Ringer, MD  Recommendations for primary care physician for things to follow:   Follow PE clinically, adjust xaralto dose of 21 days. His neck showed patient follows with in pulmonary on a close basis in the outpatient setting.   Admission Diagnosis  Acute posthemorrhagic anemia [285.1] Pulmonary embolism [415.19] Constipation [564.00]  Discharge Diagnosis   PE, left leg DVT with a mobile tip  Active Problems:   Pulmonary embolism   PE (pulmonary embolism)      Past Medical History  Diagnosis Date  . Hypothyroidism   . Hypercholesteremia   . Left knee DJD   . PONV (postoperative nausea and vomiting)   . History of anemia     Past Surgical History  Procedure Laterality Date  . Abdominal hysterectomy  1973  . Tonsillectomy  1961  . Shoulder adhesion release Left 2000  . Knee arthroscopy Left   . Total knee arthroplasty Left 12/25/2012    Procedure: TOTAL KNEE ARTHROPLASTY- left;  Surgeon: Lorn Junes, MD;  Location: [place];  Service: Orthopedics;  Laterality: Left;     Discharge Condition: Stable    Follow UP      Follow-up Information   Follow up with Tivis Ringer, MD. Schedule an appointment as soon as possible for a visit in 4 days. (and your Bone doctor)    Specialty:  Internal Medicine   Contact information:   [facility] [place] [address] [phone]       Follow up with Rigoberto Noel., MD. Schedule an appointment as soon as possible for a visit in 2 weeks.   Specialty:  Pulmonary Disease   Contact information:   [address]  [address] [phone]       Follow up with [facility], Antonietta Jewel, MD. Schedule an appointment as soon as possible for a visit in 2 weeks.   Specialty:  Interventional Radiology   Contact information:   [address]  [place] [address] [phone]         Consults obtained - none   Discharge Medications      Medication List    STOP taking these medications       aspirin 325 MG EC tablet      TAKE these medications       atorvastatin 10 MG tablet  Commonly known as:  LIPITOR  Take 10 mg by mouth daily.     CALCIUM 1000 + D PO  Take 1 tablet by mouth daily.     levothyroxine 100 MCG tablet  Commonly known as:  SYNTHROID, LEVOTHROID  Take 100 mcg by mouth daily before breakfast.     loratadine 10 MG tablet  Commonly known as:  CLARITIN  Take 10 mg by mouth daily.  multivitamin with minerals tablet  Take 1 tablet by mouth daily.     oxyCODONE 5 MG immediate release tablet  Commonly known as:  Oxy IR/ROXICODONE  1-2 tablets every 4-6 hrs as needed for pain     Rivaroxaban 15 MG Tabs tablet  Commonly known as:  XARELTO  Take 1 tablet (15 mg total) by mouth 2 (two) times daily with a meal.     vitamin C 500 MG tablet  Commonly known as:  ASCORBIC ACID  Take 250 mg by mouth daily.     Vitamin D 2000 UNITS Caps  Take 1 capsule by mouth daily.         Diet and Activity recommendation: See Discharge Instructions below   Discharge Instructions     Follow with Primary MD Tivis Ringer, MD in 4 days   Get CBC, CMP, checked 4 days by Primary MD and again as instructed by your Primary MD. Get a 2 view Chest X ray done next visit if you had Pneumonia of Lung problems at the Hospital.   Activity: As tolerated with Full fall precautions use walker/cane & assistance as needed   Disposition Home     Diet: Heart Healthy    For Heart failure patients - Check your Weight same time everyday, if you gain over 2 pounds, or you develop in  leg swelling, experience more shortness of breath or chest pain, call your Primary MD immediately. Follow Cardiac Low Salt Diet and 1.8 lit/day fluid restriction.   On your next visit with her primary care physician please Get Medicines reviewed and adjusted.  Please request your Prim.MD to go over all Hospital Tests and Procedure/Radiological results at the follow up, please get all Hospital records sent to your Prim MD by signing hospital release before you go home.   If you experience worsening of your admission symptoms, develop shortness of breath, life threatening emergency, suicidal or homicidal thoughts you must seek medical attention immediately by calling 911 or calling your MD immediately  if symptoms less severe.  You Must read complete instructions/literature along with all the possible adverse reactions/side effects for all the Medicines you take and that have been prescribed to you. Take any new Medicines after you have completely understood and accpet all the possible adverse reactions/side effects.   Do not drive and provide baby sitting services if your were admitted for syncope or siezures until you have seen by Primary MD or a Neurologist and advised to do so again.  Do not drive when taking Pain medications.    Do not take more than prescribed Pain, Sleep and Anxiety Medications  Special Instructions: If you have smoked or chewed Tobacco  in the last 2 yrs please stop smoking, stop any regular Alcohol  and or any Recreational drug use.  Wear Seat belts while driving.   Please note  You were cared for by a hospitalist during your hospital stay. If you have any questions about your discharge medications or the care you received while you were in the hospital after you are discharged, you can call the unit and asked to speak with the hospitalist on call if the hospitalist that took care of you is not available. Once you are discharged, your primary care physician will  handle any further medical issues. Please note that NO REFILLS for any discharge medications will be authorized once you are discharged, as it is imperative that you return to your primary care physician (or establish a relationship with a  primary care physician if you do not have one) for your aftercare needs so that they can reassess your need for medications and monitor your lab values.     Major procedures and Radiology Reports - PLEASE review detailed and final reports for all details, in brief -   Venous US Legs  Bilateral lower extremity venous duplex completed.   Preliminary report: Right: No evidence of DVT, superficial thrombosis, or Baker's cyst. Left - POSITIVE - MOBILE DVT OF THE ENTIRE POPLITEAL VEIN. No evidence of superficial thrombosis. No Baker's cyst.    Ct Angio Chest Pe W/cm &/or Wo Cm  02/14/2013   CLINICAL DATA:  Chest pain and shortness of breath.  EXAM: CT ANGIOGRAPHY CHEST WITH CONTRAST  TECHNIQUE: Multidetector CT imaging of the chest was performed using the standard protocol during bolus administration of intravenous contrast. Multiplanar CT image reconstructions including MIPs were obtained to evaluate the vascular anatomy.  CONTRAST:  114mL OMNIPAQUE IOHEXOL 350 MG/ML SOLN  COMPARISON:  Chest x-ray dated 12/18/2012  FINDINGS: There are multiple bilateral pulmonary emboli. RV/LV ratio is greater than 1 indicating right heart strain.  The emboli extend into all lobes of both lungs.  Heart size is normal. No infiltrates or effusions. No acute osseous abnormality.  Review of the MIP images confirms the above findings.  IMPRESSION: Numerous bilateral pulmonary emboli. Abnormal RV/ LV ratio consistent with right heart strain.   Electronically Signed   By: Rozetta Nunnery M.D.   On: 02/14/2013 17:43    Echo  - Left ventricle: The cavity size was normal. Wall thickness was increased in a pattern of moderate LVH. Systolic function was normal. The estimated ejection fraction  was in the range of 50% to 55%. Wall motion was normal; there were no regional wall motion abnormalities. Doppler parameters are consistent with abnormal left ventricular relaxation (grade 1 diastolic dysfunction). - Aortic valve: Mild regurgitation. - Mitral valve: Mild regurgitation.    Micro Results      No results found for this or any previous visit (from the past 240 hour(s)).   History of present illness and  Hospital Course:     Kindly see H&P for history of present illness and admission details, please review complete Labs, Consult reports and Test reports for all details in brief Whitney Medina, is a 79 y.o. female, patient with history of recent left knee surgery, hypothyroidism, dyslipidemia who presented to the hospital with chief complaints of shortness of breath, workup in the ER was consistent with bilateral PEs and elevated d-dimer. She was admitted to the hospital initially with IV heparin and then transitioned to oral xaralto. She is now symptom-free without any chest pain or shortness of breath, she ambulated in the hallway without any discomfort and oxygen demand.    During her workup she underwent ultrasound of her lower extremities which confirmed a large clot in the left lower extremity with a mobile tip, due to her large PE burden in the chest I discussed her case with pulmonary physician on call Dr. Nelda Marseille who agreed that patient will benefit from a retrievable IVC filter which was placed on 02/15/2013 by interventional radiology. Post discharge patient will continue to follow with the IR department.   At this point she will be placed on xaralto and discharged home. We'll recommend 1 time outpatient followup with pulmonary to see what will be the appropriate time to retrieve the IVC filter out. She had an echocardiogram which showed a preserved EF of over 50%, no  right ventricular strain.   She had mild bleeding from the IJ catheter insertion site on 02/16/2013  which is completely resolved with gentle pressure .   He will also follow with her PCP and orthopedic surgeon post discharge and a close basis.       Today   Subjective:   Whitney Medina today has no headache,no chest abdominal pain,no new weakness tingling or numbness, feels much better wants to go home today.   Objective:   Blood pressure 108/67, pulse 75, temperature 98.7 F (37.1 C), temperature source Oral, resp. rate 18, height 5\' 9"  (1.753 m), weight 70.6 kg (155 lb 10.3 oz), SpO2 93.00%.   Intake/Output Summary (Last 24 hours) at 02/17/13 0907 Last data filed at 02/16/13 1700  Gross per 24 hour  Intake    480 ml  Output    201 ml  Net    279 ml    Exam Awake Alert, Oriented *3, No new F.N deficits, Normal affect [place].AT,PERRAL Supple Neck,No JVD, No cervical lymphadenopathy appriciated.  Symmetrical Chest wall movement, Good air movement bilaterally, CTAB RRR,No Gallops,Rubs or new Murmurs, No Parasternal Heave +ve B.Sounds, Abd Soft, Non tender, No organomegaly appriciated, No rebound -guarding or rigidity. No Cyanosis, Clubbing or edema, No new Rash or bruise  Data Review   CBC w Diff: Lab Results  Component Value Date   WBC 4.8 02/17/2013   HGB 11.8* 02/17/2013   HCT 36.5 02/17/2013   PLT 176 02/17/2013   LYMPHOPCT 32 12/18/2012   MONOPCT 9 12/18/2012   EOSPCT 0 12/18/2012   BASOPCT 0 12/18/2012    CMP: Lab Results  Component Value Date   NA 147 02/15/2013   K 4.2 02/15/2013   CL 110 02/15/2013   CO2 25 02/15/2013   BUN 7 02/15/2013   CREATININE 0.75 02/15/2013   PROT 6.7 02/14/2013   ALBUMIN 3.6 02/14/2013   BILITOT 0.3 02/14/2013   ALKPHOS 80 02/14/2013   AST 25 02/14/2013   ALT 16 02/14/2013  .   Total Time in preparing paper work, data evaluation and todays exam - 35 minutes  Thurnell Lose M.D on 02/17/2013 at 9:07 AM  [facility] Office  [phone]

## 2013-02-15 NOTE — ED Notes (Signed)
Tolerated procedure well with fentanyl.

## 2013-02-16 DIAGNOSIS — I359 Nonrheumatic aortic valve disorder, unspecified: Secondary | ICD-10-CM

## 2013-02-16 LAB — CBC
HEMATOCRIT: 35.8 % — AB (ref 36.0–46.0)
Hemoglobin: 11.6 g/dL — ABNORMAL LOW (ref 12.0–15.0)
MCH: 30.4 pg (ref 26.0–34.0)
MCHC: 32.4 g/dL (ref 30.0–36.0)
MCV: 93.7 fL (ref 78.0–100.0)
Platelets: 184 10*3/uL (ref 150–400)
RBC: 3.82 MIL/uL — AB (ref 3.87–5.11)
RDW: 12.8 % (ref 11.5–15.5)
WBC: 5 10*3/uL (ref 4.0–10.5)

## 2013-02-16 NOTE — Progress Notes (Signed)
At 1730 right neck dressing noted to have new blood after being changed initially at 0900.  Pt endorsed "soreness" at the site.  Dressing removed, slow ooze visualized, pressure applied for 5 mins with new dressing.  Attending MD notified.  Ordered pressure to be held for 45 mins with head elevated as high as tolerated, and to notify IR MD on call.  Pressure began at 1745, multiple calls to radiology eventually yielded Dr Adron Bene pager.  Page returned at 1805.  Per Dr Adron Bene instruction, pressure was discontinued at 25 mins, site visualized, bleeding stopped.  Site redressed, Pt instructed to keep head elevated.  Will monitor closely, update attending MD and report thoroughly to next shift RN.

## 2013-02-16 NOTE — Progress Notes (Signed)
Patient Demographics  Whitney Medina, is a 80 y.o. female, DOB - 10/25/1932, OHY:[id number]  Admit date - 02/14/2013   Admitting Physician Theodis Blaze, MD  Outpatient Primary MD for the patient is Tivis Ringer, MD  LOS - 2   Chief Complaint  Patient presents with  . Shortness of Breath        Assessment & Plan    1. Pulmonary embolism - With a large clot burden, recent left knee surgery with a positive popliteal DVT in the left leg with a mobile tip, she is on anticoagulation with xaralto case discussed with pulmonary physician Dr. Nelda Marseille who agrees with placement of a retrievable IVC filter in the light of large clot burden in the lungs and a mobile DVT in the left lower extremity. Explained to the patient in detail she is acceptable with the plan. IVC filter placed, pt is better but feels anxious, was DC'd, will keep overnight check ECHO.    2.Hypothyroidism. Continue home dose Synthroid.    3.Dyslipidemia. Continue home dose statin.    4. Recent left knee surgery. Post discharge follow with Orthopedics.     Code Status: Full   Family Communication:   Disposition Plan: Home   Procedures CTA, Lower Leg [place]   Consults  IR   Medications  Scheduled Meds: . atorvastatin  10 mg Oral q1800  . levothyroxine  100 mcg Oral QAC breakfast  . loratadine  10 mg Oral Daily  . pantoprazole  40 mg Oral Daily  . Rivaroxaban  15 mg Oral BID WC  . [START ON 03/08/2013] Rivaroxaban  20 mg Oral Q supper  . sodium chloride  3 mL Intravenous Q12H  . vitamin C  250 mg Oral Daily   Continuous Infusions:  PRN Meds:.HYDROmorphone (DILAUDID) injection, ondansetron (ZOFRAN) IV, ondansetron, oxyCODONE  DVT Prophylaxis  Xaralto  Lab Results  Component Value Date   PLT 184 02/16/2013     Antibiotics     Anti-infectives   None          Subjective:   Whitney Medina today has, No headache, No chest pain, No abdominal pain - No Nausea, No new weakness tingling or numbness, No Cough - SOB.    Objective:   Filed Vitals:   02/15/13 1750 02/15/13 1820 02/15/13 2016 02/16/13 0439  BP: 126/75 128/76 129/73 120/65  Pulse: 83 84 92 74  Temp:   98.9 F (37.2 C) 97.2 F (36.2 C)  TempSrc:   Oral Oral  Resp: 20 20 18 16   Height:      Weight:      SpO2: 95% 97% 98% 93%    Wt Readings from Last 3 Encounters:  02/15/13 71.26 kg (157 lb 1.6 oz)  12/26/12 75.297 kg (166 lb)  12/26/12 75.297 kg (166 lb)     Intake/Output Summary (Last 24 hours) at 02/16/13 1328 Last data filed at 02/16/13 1100  Gross per 24 hour  Intake    240 ml  Output    601 ml  Net   -361 ml    Exam Awake Alert, Oriented X 3, No new F.N deficits, Normal affect [name].AT,PERRAL Supple Neck,No JVD, No cervical lymphadenopathy appriciated.  Symmetrical Chest wall movement, Good air movement bilaterally, CTAB RRR,No  Gallops,Rubs or new Murmurs, No Parasternal Heave +ve B.Sounds, Abd Soft, Non tender, No organomegaly appriciated, No rebound - guarding or rigidity. No Cyanosis, Clubbing or edema, No new Rash or bruise  L leg mildly swollen   Data Review   Micro Results No results found for this or any previous visit (from the past 240 hour(s)).  Radiology Reports Ct Angio Chest Pe W/cm &/or Wo Cm  02/14/2013   CLINICAL DATA:  Chest pain and shortness of breath.  EXAM: CT ANGIOGRAPHY CHEST WITH CONTRAST  TECHNIQUE: Multidetector CT imaging of the chest was performed using the standard protocol during bolus administration of intravenous contrast. Multiplanar CT image reconstructions including MIPs were obtained to evaluate the vascular anatomy.  CONTRAST:  145mL OMNIPAQUE IOHEXOL 350 MG/ML SOLN  COMPARISON:  Chest x-ray dated 12/18/2012  FINDINGS: There are multiple bilateral pulmonary  emboli. RV/LV ratio is greater than 1 indicating right heart strain.  The emboli extend into all lobes of both lungs.  Heart size is normal. No infiltrates or effusions. No acute osseous abnormality.  Review of the MIP images confirms the above findings.  IMPRESSION: Numerous bilateral pulmonary emboli. Abnormal RV/ LV ratio consistent with right heart strain.   Electronically Signed   By: Rozetta Nunnery M.D.   On: 02/14/2013 17:43    CBC  Recent Labs Lab 02/14/13 1508 02/15/13 0336 02/16/13 0328  WBC 6.0 5.6 5.0  HGB 13.7 11.5* 11.6*  HCT 41.7 35.2* 35.8*  PLT 223 173 184  MCV 93.7 93.9 93.7  MCH 30.8 30.7 30.4  MCHC 32.9 32.7 32.4  RDW 12.9 13.0 12.8    Chemistries   Recent Labs Lab 02/14/13 1508 02/15/13 0336  NA 144 147  K 4.2 4.2  CL 104 110  CO2 25 25  GLUCOSE 183* 90  BUN 8 7  CREATININE 0.69 0.75  CALCIUM 9.0 8.4  AST 25  --   ALT 16  --   ALKPHOS 80  --   BILITOT 0.3  --    ------------------------------------------------------------------------------------------------------------------ estimated creatinine clearance is 60.6 ml/min (by C-G formula based on Cr of 0.75). ------------------------------------------------------------------------------------------------------------------ No results found for this basename: HGBA1C,  in the last 72 hours ------------------------------------------------------------------------------------------------------------------ No results found for this basename: CHOL, HDL, LDLCALC, TRIG, CHOLHDL, LDLDIRECT,  in the last 72 hours ------------------------------------------------------------------------------------------------------------------ No results found for this basename: TSH, T4TOTAL, FREET3, T3FREE, THYROIDAB,  in the last 72 hours ------------------------------------------------------------------------------------------------------------------ No results found for this basename: VITAMINB12, FOLATE, FERRITIN, TIBC, IRON,  RETICCTPCT,  in the last 72 hours  Coagulation profile  Recent Labs Lab 02/14/13 1508  INR 1.00    No results found for this basename: DDIMER,  in the last 72 hours  Cardiac Enzymes No results found for this basename: CK, CKMB, TROPONINI, MYOGLOBIN,  in the last 168 hours ------------------------------------------------------------------------------------------------------------------ No components found with this basename: POCBNP,      Time Spent in minutes   35   Whitney Stahly K M.D on 02/16/2013 at 1:28 PM  Between 7am to 7pm - Pager - [phone]  After 7pm go to [email or web address] - password [email or web address]  And look for the night coverage person covering for me after hours  [facility] Office  [phone]

## 2013-02-16 NOTE — Progress Notes (Signed)
Echocardiogram 2D Echocardiogram has been performed.  Joelene Millin 02/16/2013, 4:34 PM

## 2013-02-17 LAB — CBC
HEMATOCRIT: 36.5 % (ref 36.0–46.0)
Hemoglobin: 11.8 g/dL — ABNORMAL LOW (ref 12.0–15.0)
MCH: 30.3 pg (ref 26.0–34.0)
MCHC: 32.3 g/dL (ref 30.0–36.0)
MCV: 93.8 fL (ref 78.0–100.0)
Platelets: 176 10*3/uL (ref 150–400)
RBC: 3.89 MIL/uL (ref 3.87–5.11)
RDW: 12.7 % (ref 11.5–15.5)
WBC: 4.8 10*3/uL (ref 4.0–10.5)

## 2013-02-17 NOTE — Progress Notes (Signed)
Pt discharged per MD order and protocol. Discharge instructions reviewed with patient and all questions answered. Pt aware of all follow up appointments. Pt aware that medications have been called into her pharmacy.

## 2013-02-17 NOTE — Progress Notes (Signed)
Pt ambulated in hallway 350 ft independently and tolerated activity well. Will continue to monitor.

## 2013-03-20 ENCOUNTER — Ambulatory Visit (INDEPENDENT_AMBULATORY_CARE_PROVIDER_SITE_OTHER): Payer: Medicare HMO | Admitting: Pulmonary Disease

## 2013-03-20 ENCOUNTER — Encounter: Payer: Self-pay | Admitting: Pulmonary Disease

## 2013-03-20 VITALS — BP 118/72 | HR 82 | Temp 97.9°F | Ht 70.0 in | Wt 155.6 lb

## 2013-03-20 DIAGNOSIS — I2699 Other pulmonary embolism without acute cor pulmonale: Secondary | ICD-10-CM

## 2013-03-20 NOTE — Patient Instructions (Signed)
Repeat  venous duplex in mid April If leg clot resolved, then we can take out filter Suggest stay on xarelto for 6-12 months

## 2013-03-20 NOTE — Progress Notes (Signed)
Subjective:    Patient ID: Whitney Medina, female    DOB: 1932-03-25, 80 y.o.   MRN: [id number]  HPI  80 y.o. female who was found to have LLE DVT and large PE burden s/p IVC filter 1/29 15 She had left knee surgery 12/25/2012 (wainer)she states since surgery she had complaints of fatigue and nausea.  she presented to [facility] extremely short of breath, CT angio showed Pulmonary embolism - With a large clot burden , strain on CT, duplex showed  popliteal DVT in the left leg with a mobile tip, hence IVC filter was placed (IR- Mccullough)  she is on anticoagulation with xaralto  and denies any active bleeding or history of bleeding  Echo nml  She feels much improved , no dyspnea or chest pain. No prior h/o GI bleed, or PUD. She is not very satisfied with results from TKR & still has residua pain requiring hydrocodone.  Past Medical History  Diagnosis Date  . Hypothyroidism   . Hypercholesteremia   . Left knee DJD   . PONV (postoperative nausea and vomiting)   . History of anemia     Past Surgical History  Procedure Laterality Date  . Abdominal hysterectomy  1973  . Tonsillectomy  1961  . Shoulder adhesion release Left 2000  . Knee arthroscopy Left   . Total knee arthroplasty Left 12/25/2012    Procedure: TOTAL KNEE ARTHROPLASTY- left;  Surgeon: Lorn Junes, MD;  Location: [place];  Service: Orthopedics;  Laterality: Left;    Allergies  Allergen Reactions  . Codeine Nausea And Vomiting    Percocet OK   History   Social History  . Marital Status: Married    Spouse Name: N/A    Number of Children: N/A  . Years of Education: N/A   Occupational History  . Not on file.   Social History Main Topics  . Smoking status: Never Smoker   . Smokeless tobacco: Not on file  . Alcohol Use: Yes     Comment: Rare glass of wine  . Drug Use: No  . Sexual Activity: Not on file   Other Topics Concern  . Not on file   Social History Narrative  . No narrative on file    Family  History  Problem Relation Age of Onset  . Cancer - Other Mother   . Melanoma Sister   . Heart disease Sister     Review of Systems Constitutional: negative for anorexia, fevers and sweats  Eyes: negative for irritation, redness and visual disturbance  Ears, nose, mouth, throat, and face: negative for earaches, epistaxis, nasal congestion and sore throat  Respiratory: negative for cough, dyspnea on exertion, sputum and wheezing  Cardiovascular: negative for chest pain, dyspnea, lower extremity edema, orthopnea, palpitations and syncope  Gastrointestinal: negative for abdominal pain, constipation, diarrhea, melena, nausea and vomiting  Genitourinary:negative for dysuria, frequency and hematuria  Hematologic/lymphatic: negative for bleeding, easy bruising and lymphadenopathy  Musculoskeletal:negative for arthralgias, muscle weakness and stiff joints  Neurological: negative for coordination problems, gait problems, headaches and weakness  Endocrine: negative for diabetic symptoms including polydipsia, polyuria and weight loss     Objective:   Physical Exam  Gen. Pleasant, well-nourished, in no distress, normal affect ENT - no lesions, no post nasal drip Neck: No JVD, no thyromegaly, no carotid bruits Lungs: no use of accessory muscles, no dullness to percussion, clear without rales or rhonchi  Cardiovascular: Rhythm regular, heart sounds  normal, no murmurs or gallops, no peripheral  edema Abdomen: soft and non-tender, no hepatosplenomegaly, BS normal. Musculoskeletal: No deformities, no cyanosis or clubbing, Lt knee mild swelling, no erythema Neuro:  alert, non focal       Assessment & Plan:

## 2013-03-20 NOTE — Assessment & Plan Note (Signed)
Repeat  venous duplex in mid April - 56months If leg clot resolved, then we can take out filter Suggest stay on xarelto for 6-12 months

## 2013-03-27 ENCOUNTER — Ambulatory Visit (HOSPITAL_COMMUNITY)
Admission: RE | Admit: 2013-03-27 | Discharge: 2013-03-27 | Disposition: A | Discharge status: Home / Self Care | Payer: Medicare HMO | Source: Ambulatory Visit | Attending: Orthopedic Surgery | Admitting: Orthopedic Surgery

## 2013-03-27 ENCOUNTER — Other Ambulatory Visit (HOSPITAL_COMMUNITY): Payer: Self-pay | Admitting: Orthopedic Surgery

## 2013-03-27 DIAGNOSIS — M79609 Pain in unspecified limb: Secondary | ICD-10-CM

## 2013-03-27 DIAGNOSIS — M7989 Other specified soft tissue disorders: Secondary | ICD-10-CM

## 2013-03-27 DIAGNOSIS — Z86718 Personal history of other venous thrombosis and embolism: Secondary | ICD-10-CM

## 2013-03-27 DIAGNOSIS — M79605 Pain in left leg: Secondary | ICD-10-CM

## 2013-03-27 NOTE — Progress Notes (Addendum)
VASCULAR LAB PRELIMINARY  PRELIMINARY  PRELIMINARY  PRELIMINARY  Left lower extremity venous duplex completed.    Preliminary report: Left leg is negative for deep and superficial vein thrombosis.  The left popliteal thrombus noted in January 2015 is no longer seen.  Itzy Adler, RVT 03/27/2013, 3:16 PM

## 2013-03-29 ENCOUNTER — Other Ambulatory Visit: Payer: Self-pay | Admitting: Interventional Radiology

## 2013-03-29 DIAGNOSIS — Z95828 Presence of other vascular implants and grafts: Secondary | ICD-10-CM

## 2013-03-29 DIAGNOSIS — I2699 Other pulmonary embolism without acute cor pulmonale: Secondary | ICD-10-CM

## 2013-04-12 ENCOUNTER — Telehealth: Payer: Self-pay | Admitting: Pulmonary Disease

## 2013-04-12 NOTE — Telephone Encounter (Signed)
MyChart Message copied below:   Appointment Request From: Elson Areas      With Provider: Rigoberto Noel., MD [facility]]      Preferred Date Range: Any date 04/09/2013 or later      Preferred Times: Any      Reason: To address the following health maintenance concerns.   Tetanus/Tdap   Colonoscopy   Zostavax   Pneumococcal Polysaccharide Vaccine Age 78 And Over   Influenza Vaccine      Comments:   [facility] all [facility] Dr. Dagmar Hait.      [name]

## 2013-04-12 NOTE — Telephone Encounter (Signed)
LMTCB

## 2013-04-16 NOTE — Telephone Encounter (Signed)
I spoke with the pt and she states she did not mean to send [place] an email and nothing further is needed. Hanson Bing, CMA

## 2013-04-25 ENCOUNTER — Ambulatory Visit (HOSPITAL_COMMUNITY): Payer: Medicare HMO

## 2013-04-25 ENCOUNTER — Telehealth: Payer: Self-pay | Admitting: Pulmonary Disease

## 2013-04-25 DIAGNOSIS — I2699 Other pulmonary embolism without acute cor pulmonale: Secondary | ICD-10-CM

## 2013-04-25 NOTE — Telephone Encounter (Signed)
Pt is aware of referral. Order placed. Nothing further needed

## 2013-04-25 NOTE — Telephone Encounter (Signed)
Patient Instructions     Repeat venous duplex in mid April  If leg clot resolved, then we can take out filter  Suggest stay on xarelto for 6-12 months   Spoke w/ Gina. Pt saw Dr. Noemi Chapel not long after seeing for swelling in left leg. He deep venous doppler left and shows clot had resolved.  Called and spoke with Dr. Elsworth Soho. Made him aware of 03/27/13 results. Will cancel the order and he will review everything when he comes in later this afternoon. Called Barnett Applebaum and made her aware as well. She will send pt home. Nothing further needed Will forward this to Dr. Elsworth Soho

## 2013-04-25 NOTE — Telephone Encounter (Signed)
Refer to IR -dr Laurence Ferrari - for removal of IVC filter

## 2013-04-26 ENCOUNTER — Other Ambulatory Visit: Payer: Self-pay | Admitting: Pulmonary Disease

## 2013-04-26 DIAGNOSIS — I2699 Other pulmonary embolism without acute cor pulmonale: Secondary | ICD-10-CM

## 2013-05-01 ENCOUNTER — Encounter: Payer: Self-pay | Admitting: Adult Health

## 2013-05-01 ENCOUNTER — Ambulatory Visit (INDEPENDENT_AMBULATORY_CARE_PROVIDER_SITE_OTHER): Payer: Medicare HMO | Admitting: Adult Health

## 2013-05-01 VITALS — BP 118/78 | HR 85 | Temp 98.5°F | Ht 70.0 in | Wt 144.6 lb

## 2013-05-01 DIAGNOSIS — I2699 Other pulmonary embolism without acute cor pulmonale: Secondary | ICD-10-CM

## 2013-05-01 NOTE — Patient Instructions (Signed)
Continue on Xarelto  follow up for IVC filter removal as planned  follow up Dr. Elsworth Soho  In 3 months and As needed

## 2013-05-01 NOTE — Progress Notes (Signed)
Subjective:    Patient ID: Whitney Medina, female    DOB: 1932-03-02, 80 y.o.   MRN: [id number]  HPI 80 y.o. female who was found to have LLE DVT and large PE burden s/p IVC filter 1/29 15 She had left knee surgery 12/25/2012 ([facility])she states since surgery she had complaints of fatigue and nausea.  she presented to [facility] extremely short of breath, CT angio showed Pulmonary embolism - With a large clot burden , strain on CT, duplex showed  popliteal DVT in the left leg with a mobile tip, hence IVC filter was placed (IR- Mccullough)  she is on anticoagulation with xaralto  and denies any active bleeding or history of bleeding  Echo nml  She feels much improved , no dyspnea or chest pain. No prior h/o GI bleed, or PUD. She is not very satisfied with results from TKR & still has residua pain requiring hydrocodone.   05/01/2013 Follow up  Returns for follow up .  Has known hx of PE and DVT  found to have LLE DVT and large PE burden s/p IVC filter 1/29 15 She had left knee surgery 12/26/18  Underwent venous doppler on 4/8 that showed resolved DVT.  She has been set up for a filter  removal 4/22, Feeling a lot better 75%, Denies  sob,denies cough/wheeze,not sleeping well,poor appetite  Past Medical History  Diagnosis Date  . Hypothyroidism   . Hypercholesteremia   . Left knee DJD   . PONV (postoperative nausea and vomiting)   . History of anemia     Past Surgical History  Procedure Laterality Date  . Abdominal hysterectomy  1973  . Tonsillectomy  1961  . Shoulder adhesion release Left 2000  . Knee arthroscopy Left   . Total knee arthroplasty Left 12/25/2012    Procedure: TOTAL KNEE ARTHROPLASTY- left;  Surgeon: Lorn Junes, MD;  Location: [place];  Service: Orthopedics;  Laterality: Left;    Allergies  Allergen Reactions  . Codeine Nausea And Vomiting    Percocet OK   History   Social History  . Marital Status: Widowed    Spouse Name: N/A    Number of Children:  N/A  . Years of Education: N/A   Occupational History  . Not on file.   Social History Main Topics  . Smoking status: Never Smoker   . Smokeless tobacco: Not on file  . Alcohol Use: Yes     Comment: Rare glass of wine  . Drug Use: No  . Sexual Activity: Not on file   Other Topics Concern  . Not on file   Social History Narrative  . No narrative on file    Family History  Problem Relation Age of Onset  . Cancer - Other Mother   . Melanoma Sister   . Heart disease Sister     Review of Systems  Constitutional: negative for anorexia, fevers and sweats  Eyes: negative for irritation, redness and visual disturbance  Ears, nose, mouth, throat, and face: negative for earaches, epistaxis, nasal congestion and sore throat  Respiratory: negative for cough, dyspnea on exertion, sputum and wheezing  Cardiovascular: negative for chest pain, dyspnea, lower extremity edema, orthopnea, palpitations and syncope  Gastrointestinal: negative for abdominal pain, constipation, diarrhea, melena, nausea and vomiting  Genitourinary:negative for dysuria, frequency and hematuria  Hematologic/lymphatic: negative for bleeding, easy bruising and lymphadenopathy  Musculoskeletal:negative for arthralgias, muscle weakness and stiff joints  Neurological: negative for coordination problems, gait problems, headaches and weakness  Endocrine: negative for diabetic symptoms including polydipsia, polyuria and weight loss     Objective:   Physical Exam   Gen. Pleasant, well-nourished, in no distress, normal affect ENT - no lesions, no post nasal drip Neck: No JVD, no thyromegaly, no carotid bruits Lungs: no use of accessory muscles, no dullness to percussion, clear without rales or rhonchi  Cardiovascular: Rhythm regular, heart sounds  normal, no murmurs or gallops, no peripheral edema Abdomen: soft and non-tender, no hepatosplenomegaly, BS normal. Musculoskeletal: No deformities, no cyanosis or clubbing,  Lt knee mild swelling, no erythema Neuro:  alert, non focal       Assessment & Plan:

## 2013-05-03 ENCOUNTER — Encounter (HOSPITAL_COMMUNITY): Payer: Self-pay | Admitting: Pharmacy Technician

## 2013-05-03 ENCOUNTER — Other Ambulatory Visit: Payer: Medicare HMO

## 2013-05-03 ENCOUNTER — Inpatient Hospital Stay: Admission: RE | Admit: 2013-05-03 | Payer: Medicare HMO | Source: Ambulatory Visit

## 2013-05-03 ENCOUNTER — Other Ambulatory Visit (HOSPITAL_COMMUNITY): Payer: Medicare HMO

## 2013-05-04 NOTE — Assessment & Plan Note (Signed)
Plan  Continue on Xarelto  follow up for IVC filter removal as planned  follow up Dr. Elsworth Soho  In 3 months and As needed

## 2013-05-07 ENCOUNTER — Other Ambulatory Visit: Payer: Self-pay | Admitting: Radiology

## 2013-05-09 ENCOUNTER — Encounter (HOSPITAL_COMMUNITY): Payer: Self-pay

## 2013-05-09 ENCOUNTER — Ambulatory Visit (HOSPITAL_COMMUNITY)
Admission: RE | Admit: 2013-05-09 | Discharge: 2013-05-09 | Disposition: A | Discharge status: Home / Self Care | Payer: Medicare HMO | Source: Ambulatory Visit | Attending: Pulmonary Disease | Admitting: Pulmonary Disease

## 2013-05-09 DIAGNOSIS — Z4689 Encounter for fitting and adjustment of other specified devices: Secondary | ICD-10-CM | POA: Insufficient documentation

## 2013-05-09 DIAGNOSIS — Z79899 Other long term (current) drug therapy: Secondary | ICD-10-CM | POA: Insufficient documentation

## 2013-05-09 DIAGNOSIS — Z86711 Personal history of pulmonary embolism: Secondary | ICD-10-CM | POA: Insufficient documentation

## 2013-05-09 DIAGNOSIS — Z96659 Presence of unspecified artificial knee joint: Secondary | ICD-10-CM | POA: Insufficient documentation

## 2013-05-09 DIAGNOSIS — E039 Hypothyroidism, unspecified: Secondary | ICD-10-CM | POA: Insufficient documentation

## 2013-05-09 DIAGNOSIS — I2699 Other pulmonary embolism without acute cor pulmonale: Secondary | ICD-10-CM

## 2013-05-09 LAB — CBC WITH DIFFERENTIAL/PLATELET
BASOS PCT: 1 % (ref 0–1)
Basophils Absolute: 0 10*3/uL (ref 0.0–0.1)
Eosinophils Absolute: 0.1 10*3/uL (ref 0.0–0.7)
Eosinophils Relative: 3 % (ref 0–5)
HCT: 40.6 % (ref 36.0–46.0)
Hemoglobin: 13.5 g/dL (ref 12.0–15.0)
LYMPHS PCT: 36 % (ref 12–46)
Lymphs Abs: 1.6 10*3/uL (ref 0.7–4.0)
MCH: 29.7 pg (ref 26.0–34.0)
MCHC: 33.3 g/dL (ref 30.0–36.0)
MCV: 89.2 fL (ref 78.0–100.0)
Monocytes Absolute: 0.4 10*3/uL (ref 0.1–1.0)
Monocytes Relative: 9 % (ref 3–12)
NEUTROS ABS: 2.3 10*3/uL (ref 1.7–7.7)
NEUTROS PCT: 51 % (ref 43–77)
Platelets: 166 10*3/uL (ref 150–400)
RBC: 4.55 MIL/uL (ref 3.87–5.11)
RDW: 14.2 % (ref 11.5–15.5)
WBC: 4.4 10*3/uL (ref 4.0–10.5)

## 2013-05-09 LAB — BASIC METABOLIC PANEL
BUN: 14 mg/dL (ref 6–23)
CO2: 29 mEq/L (ref 19–32)
CREATININE: 0.72 mg/dL (ref 0.50–1.10)
Calcium: 9.7 mg/dL (ref 8.4–10.5)
Chloride: 102 mEq/L (ref 96–112)
GFR calc Af Amer: >90 mL/min (ref >90)
GFR, EST NON AFRICAN AMERICAN: 80 mL/min — AB (ref >90)
Glucose, Bld: 94 mg/dL (ref 70–99)
POTASSIUM: 4.3 meq/L (ref 3.7–5.3)
Sodium: 141 mEq/L (ref 137–147)

## 2013-05-09 LAB — PROTIME-INR
INR: 1.75 — AB (ref 0.00–1.49)
PROTHROMBIN TIME: 19.9 s — AB (ref 11.6–15.2)

## 2013-05-09 MED ORDER — SODIUM CHLORIDE 0.9 % IV SOLN
INTRAVENOUS | Status: DC
  Administered 2013-05-09: 08:00:00 via INTRAVENOUS

## 2013-05-09 MED ORDER — CEFAZOLIN SODIUM-DEXTROSE 2-3 GM-% IV SOLR: 2.0000 g | Freq: Once | INTRAVENOUS | Status: DC

## 2013-05-09 MED ORDER — FENTANYL CITRATE 0.05 MG/ML IJ SOLN
INTRAMUSCULAR | Status: AC
  Filled 2013-05-09: qty 2

## 2013-05-09 MED ORDER — IOHEXOL 300 MG/ML  SOLN
10.0000 mL | Freq: Once | INTRAMUSCULAR | Status: AC | PRN
  Administered 2013-05-09: 40 mL via INTRAVENOUS

## 2013-05-09 MED ORDER — MIDAZOLAM HCL 2 MG/2ML IJ SOLN
INTRAMUSCULAR | Status: AC
  Filled 2013-05-09: qty 2

## 2013-05-09 MED ORDER — MIDAZOLAM HCL 2 MG/2ML IJ SOLN
INTRAMUSCULAR | Status: AC | PRN
  Administered 2013-05-09: 0.5 mg via INTRAVENOUS
  Administered 2013-05-09: 1 mg via INTRAVENOUS
  Administered 2013-05-09: 0.5 mg via INTRAVENOUS

## 2013-05-09 MED ORDER — FENTANYL CITRATE 0.05 MG/ML IJ SOLN
INTRAMUSCULAR | Status: AC | PRN
  Administered 2013-05-09 (×2): 25 ug via INTRAVENOUS

## 2013-05-09 NOTE — H&P (Signed)
Whitney Medina is an 80 y.o. female.   Chief Complaint: "I'm here to get my filter out" HPI: Patient with prior history of left knee surgery/ large burden PE and mobile LLE DVT followed by placement of IVC filter on 02/15/13. Recent LLE venous doppler revealed resolution of DVT and pt is currently stable on xarelto. She presents today for IVC filter removal.  Past Medical History  Diagnosis Date  . Hypothyroidism   . Hypercholesteremia   . Left knee DJD   . PONV (postoperative nausea and vomiting)   . History of anemia     Past Surgical History  Procedure Laterality Date  . Abdominal hysterectomy  1973  . Tonsillectomy  1961  . Shoulder adhesion release Left 2000  . Knee arthroscopy Left   . Total knee arthroplasty Left 12/25/2012    Procedure: TOTAL KNEE ARTHROPLASTY- left;  Surgeon: Lorn Junes, MD;  Location: [place];  Service: Orthopedics;  Laterality: Left;    Family History  Problem Relation Age of Onset  . Cancer - Other Mother   . Melanoma Sister   . Heart disease Sister    Social History:  reports that she has never smoked. She does not have any smokeless tobacco history on file. She reports that she drinks alcohol. She reports that she does not use illicit drugs.  Allergies:  Allergies  Allergen Reactions  . Codeine Nausea And Vomiting    Percocet OK    Current outpatient prescriptions:atorvastatin (LIPITOR) 20 MG tablet, Take 20 mg by mouth every evening., Disp: , Rfl: ;  Calcium Carb-Cholecalciferol (CALCIUM 1000 + D PO), Take 1 tablet by mouth daily., Disp: , Rfl: ;  Cholecalciferol (VITAMIN D) 2000 UNITS CAPS, Take 1 capsule by mouth daily., Disp: , Rfl: ;  levothyroxine (SYNTHROID, LEVOTHROID) 100 MCG tablet, Take 100 mcg by mouth daily before breakfast., Disp: , Rfl:  loratadine (CLARITIN) 10 MG tablet, Take 10 mg by mouth daily., Disp: , Rfl: ;  Multiple Vitamins-Minerals (MULTIVITAMIN WITH MINERALS) tablet, Take 1 tablet by mouth daily., Disp: , Rfl: ;   ranitidine (ZANTAC) 150 MG tablet, Take 150 mg by mouth daily., Disp: , Rfl: ;  rivaroxaban (XARELTO) 20 MG TABS tablet, Take 20 mg by mouth daily with supper., Disp: , Rfl: ;  traZODone (DESYREL) 50 MG tablet, Take 50 mg by mouth at bedtime., Disp: , Rfl:  vitamin C (ASCORBIC ACID) 500 MG tablet, Take 500 mg by mouth daily. , Disp: , Rfl: ;  acetaminophen (TYLENOL) 500 MG tablet, Take 1,000 mg by mouth every 6 (six) hours as needed for mild pain or moderate pain., Disp: , Rfl:  Current facility-administered medications:0.9 %  sodium chloride infusion, , Intravenous, Continuous, Ascencion Dike, PA-C;  ceFAZolin (ANCEF) IVPB 2 g/50 mL premix, 2 g, Intravenous, Once, Ascencion Dike, PA-C    Review of Systems  Constitutional: Negative for fever and chills.  Respiratory: Negative for cough, hemoptysis and shortness of breath.   Cardiovascular: Negative for chest pain.  Gastrointestinal: Negative for nausea, vomiting, abdominal pain and blood in stool.  Genitourinary: Negative for hematuria.  Musculoskeletal: Negative for back pain.  Neurological: Negative for headaches.  Endo/Heme/Allergies: Does not bruise/bleed easily.   Results for orders placed during the hospital encounter of 05/09/13  CBC WITH DIFFERENTIAL      Result Value Ref Range   WBC 4.4  4.0 - 10.5 K/uL   RBC 4.55  3.87 - 5.11 MIL/uL   Hemoglobin 13.5  12.0 - 15.0 g/dL  HCT 40.6  36.0 - 46.0 %   MCV 89.2  78.0 - 100.0 fL   MCH 29.7  26.0 - 34.0 pg   MCHC 33.3  30.0 - 36.0 g/dL   RDW 14.2  11.5 - 15.5 %   Platelets 166  150 - 400 K/uL   Neutrophils Relative % 51  43 - 77 %   Neutro Abs 2.3  1.7 - 7.7 K/uL   Lymphocytes Relative 36  12 - 46 %   Lymphs Abs 1.6  0.7 - 4.0 K/uL   Monocytes Relative 9  3 - 12 %   Monocytes Absolute 0.4  0.1 - 1.0 K/uL   Eosinophils Relative 3  0 - 5 %   Eosinophils Absolute 0.1  0.0 - 0.7 K/uL   Basophils Relative 1  0 - 1 %   Basophils Absolute 0.0  0.0 - 0.1 K/uL  05/09/13  BMP/PT/INR  PENDING Filed Vitals:   05/09/13 0837  Pulse: 70  Temp: 98 F (36.7 C)  TempSrc: Oral  Resp: 16  Height: 5\' 10"  (1.778 m)  Weight: 140 lb (63.504 kg)  SpO2: 100%     Physical Exam  Constitutional: She is oriented to person, place, and time. She appears well-developed and well-nourished.  Cardiovascular: Normal rate and regular rhythm.   Respiratory: Effort normal and breath sounds normal.  GI: Soft. Bowel sounds are normal.  Musculoskeletal: Normal range of motion.  Trace LE edema  Neurological: She is alert and oriented to person, place, and time.     Assessment/Plan Patient with prior history of left knee surgery/ large burden PE and mobile LLE DVT followed by placement of IVC filter on 02/15/13. Recent LLE venous doppler revealed resolution of DVT and pt is currently stable on xarelto. She presents today for IVC filter removal. Details/risks of procedure d/w pt with her understanding and consent.  [name] Kerstin Crusoe 05/09/2013, 8:26 AM

## 2013-05-09 NOTE — Discharge Instructions (Signed)
May remove the bandaid tomorrow and shower. Call your MD for any problems or questions. Call for redness, drainage, or swelling.       Moderate Sedation, Adult Moderate sedation is given to help you relax or even sleep through a procedure. You may remain sleepy, be clumsy, or have poor balance for several hours following this procedure. Arrange for a responsible adult, family member, or friend to take you home. A responsible adult should stay with you for at least 24 hours or until the medicines have worn off.  Do not participate in any activities where you could become injured for the next 24 hours, or until you feel normal again. Do not:  Drive.  Swim.  Ride a bicycle.  Operate heavy machinery.  Cook.  Use power tools.  Climb ladders.  Work at [employer].  Do not make important decisions or sign legal documents until you are improved.  Vomiting may occur if you eat too soon. When you can drink without vomiting, try water, juice, or soup. Try solid foods if you feel little or no nausea.  Only take over-the-counter or prescription medications for pain, discomfort, or fever as directed by your caregiver.If pain medications have been prescribed for you, ask your caregiver how soon it is safe to take them.  Make sure you and your family fully understands everything about the medication given to you. Make sure you understand what side effects may occur.  You should not drink alcohol, take sleeping pills, or medications that cause drowsiness for at least 24 hours.  If you smoke, do not smoke alone.  If you are feeling better, you may resume normal activities 24 hours after receiving sedation.  Keep all appointments as scheduled. Follow all instructions.  Ask questions if you do not understand. SEEK MEDICAL CARE IF:   Your skin is pale or bluish in color.  You continue to feel sick to your stomach (nauseous) or throw up (vomit).  Your pain is getting worse and not helped by  medication.  You have bleeding or swelling.  You are still sleepy or feeling clumsy after 24 hours. SEEK IMMEDIATE MEDICAL CARE IF:   You develop a rash.  You have difficulty breathing.  You develop any type of allergic problem.  You have a fever. Document Released: 09/29/2000 Document Revised: 03/29/2011 Document Reviewed: 09/11/2012 [facility] Patient Information 2014 [place].

## 2013-05-09 NOTE — Progress Notes (Signed)
Patient took Xarelto last night at 1900. Spoke to [place] in [employer]. She informed Dr. Laurence Ferrari and he was made aware. No orders received.

## 2013-05-09 NOTE — Procedures (Signed)
Interventional Radiology Procedure Note  Procedure: Successful retrieval of IVC filter.  No evidence of caval thrombus or thrombus in filter. Complications: None Recommendations: - Continue xarelto - 2 hrs bedrest with HOB elevated  Signed,  Criselda Peaches, MD [facility]

## 2013-07-24 ENCOUNTER — Ambulatory Visit (INDEPENDENT_AMBULATORY_CARE_PROVIDER_SITE_OTHER): Payer: Commercial Managed Care - HMO | Admitting: Pulmonary Disease

## 2013-07-24 ENCOUNTER — Encounter: Payer: Self-pay | Admitting: Pulmonary Disease

## 2013-07-24 VITALS — BP 122/70 | HR 85 | Temp 97.7°F | Ht 69.5 in | Wt 142.6 lb

## 2013-07-24 DIAGNOSIS — I2699 Other pulmonary embolism without acute cor pulmonale: Secondary | ICD-10-CM

## 2013-07-24 NOTE — Progress Notes (Signed)
   Subjective:    Patient ID: Whitney Medina, female    DOB: 1932/12/12, 80 y.o.   MRN: [id number]  HPI  80 y.o. Woman admitted with LLE DVT and large PE burden s/p IVC filter 02/15/13  She had left knee surgery 12/25/2012 ([facility]) CT angio showed Pulmonary embolism - With a large clot burden , strain on CT, duplex showed popliteal DVT in the left leg with a mobile tip, hence IVC filter was placed Echo nml    07/24/2013  Chief Complaint  Patient presents with  . Follow-up    Pt reports she is feeling good. The best she has felt in ages. denies any SOB.    Underwent venous doppler on 4/8 that showed resolved DVT.   filter removed 4/22,  Remains on xarelto 20 mg She feels much improved , no dyspnea or chest pain.  No prior h/o GI bleed, or PUD.  She is back to her usual mobility level Denies sob,denies cough/wheeze   Review of Systems neg for any significant sore throat, dysphagia, itching, sneezing, nasal congestion or excess/ purulent secretions, fever, chills, sweats, unintended wt loss, pleuritic or exertional cp, hempoptysis, orthopnea pnd or change in chronic leg swelling. Also denies presyncope, palpitations, heartburn, abdominal pain, nausea, vomiting, diarrhea or change in bowel or urinary habits, dysuria,hematuria, rash, arthralgias, visual complaints, headache, numbness weakness or ataxia.     Objective:   Physical Exam  Gen. Pleasant, well-nourished, in no distress ENT - no lesions, no post nasal drip Neck: No JVD, no thyromegaly, no carotid bruits Lungs: no use of accessory muscles, no dullness to percussion, clear without rales or rhonchi  Cardiovascular: Rhythm regular, heart sounds  normal, no murmurs or gallops, no peripheral edema Musculoskeletal: No deformities, no cyanosis or clubbing        Assessment & Plan:

## 2013-07-24 NOTE — Patient Instructions (Signed)
On Aug 1, you can stop Xarelto & start taking ASA 325 mg instead Blood test in first week of September (D-dimer)

## 2013-07-24 NOTE — Assessment & Plan Note (Signed)
  Will complete 6 months -On Aug 1, you can stop Xarelto & start taking ASA 325 mg instead Blood test in first week of September (D-dimer)

## 2013-10-01 ENCOUNTER — Other Ambulatory Visit: Payer: Self-pay | Admitting: Internal Medicine

## 2013-10-01 ENCOUNTER — Ambulatory Visit
Admission: RE | Admit: 2013-10-01 | Discharge: 2013-10-01 | Disposition: A | Discharge status: Home / Self Care | Payer: Commercial Managed Care - HMO | Source: Ambulatory Visit | Attending: Internal Medicine | Admitting: Internal Medicine

## 2013-10-01 DIAGNOSIS — R0789 Other chest pain: Secondary | ICD-10-CM

## 2013-10-01 DIAGNOSIS — I2699 Other pulmonary embolism without acute cor pulmonale: Secondary | ICD-10-CM

## 2013-10-01 MED ORDER — IOHEXOL 350 MG/ML SOLN
100.0000 mL | Freq: Once | INTRAVENOUS | Status: AC | PRN
  Administered 2013-10-01: 100 mL via INTRAVENOUS

## 2013-10-04 ENCOUNTER — Telehealth: Payer: Self-pay | Admitting: Pulmonary Disease

## 2013-10-04 MED ORDER — RIVAROXABAN 20 MG PO TABS: 20.0000 mg | ORAL_TABLET | Freq: Every day | ORAL | Status: DC

## 2013-10-04 NOTE — Telephone Encounter (Signed)
RX has been called in. Nothing further needed 

## 2013-10-04 NOTE — Telephone Encounter (Signed)
Spoke with the pt  She had D-Dimer at [facility] and they supposed to have faxed the results to [place]  RA, have you seen this? Please advise thanks!

## 2013-10-04 NOTE — Telephone Encounter (Signed)
Spoke to pt & d/w Dr Dagmar Hait - small residual clot in RLL Resume  -pl call Rx for  -xarelto 20 mg daily x 1 month x 5 refills

## 2013-10-25 ENCOUNTER — Telehealth: Payer: Self-pay | Admitting: Pulmonary Disease

## 2013-10-25 NOTE — Telephone Encounter (Signed)
Spoke with patient-aware that we do not have any samples of Xarelto 20 mg here and free offer card from the company is not usable due to her insurance. Pt will contact her insurance to see if there is a cheaper alternative on her formulary list and call us back. We can create a new message for this when patient calls back.

## 2014-01-16 ENCOUNTER — Emergency Department (HOSPITAL_COMMUNITY): Payer: Commercial Managed Care - HMO

## 2014-01-16 ENCOUNTER — Inpatient Hospital Stay (HOSPITAL_COMMUNITY)
Admission: EM | Admit: 2014-01-16 | Discharge: 2014-01-18 | DRG: 312 | Disposition: A | Discharge status: Home / Self Care | Payer: Commercial Managed Care - HMO | Attending: Internal Medicine | Admitting: Internal Medicine

## 2014-01-16 ENCOUNTER — Encounter (HOSPITAL_COMMUNITY): Payer: Self-pay | Admitting: Emergency Medicine

## 2014-01-16 DIAGNOSIS — I77819 Aortic ectasia, unspecified site: Secondary | ICD-10-CM | POA: Diagnosis present

## 2014-01-16 DIAGNOSIS — I2699 Other pulmonary embolism without acute cor pulmonale: Secondary | ICD-10-CM | POA: Diagnosis present

## 2014-01-16 DIAGNOSIS — I480 Paroxysmal atrial fibrillation: Secondary | ICD-10-CM | POA: Diagnosis present

## 2014-01-16 DIAGNOSIS — J9811 Atelectasis: Secondary | ICD-10-CM | POA: Diagnosis present

## 2014-01-16 DIAGNOSIS — Z86711 Personal history of pulmonary embolism: Secondary | ICD-10-CM | POA: Diagnosis not present

## 2014-01-16 DIAGNOSIS — Z86718 Personal history of other venous thrombosis and embolism: Secondary | ICD-10-CM

## 2014-01-16 DIAGNOSIS — Z90710 Acquired absence of both cervix and uterus: Secondary | ICD-10-CM | POA: Diagnosis not present

## 2014-01-16 DIAGNOSIS — I2510 Atherosclerotic heart disease of native coronary artery without angina pectoris: Secondary | ICD-10-CM | POA: Diagnosis present

## 2014-01-16 DIAGNOSIS — E7800 Pure hypercholesterolemia, unspecified: Secondary | ICD-10-CM | POA: Diagnosis present

## 2014-01-16 DIAGNOSIS — J8410 Pulmonary fibrosis, unspecified: Secondary | ICD-10-CM | POA: Diagnosis present

## 2014-01-16 DIAGNOSIS — I4891 Unspecified atrial fibrillation: Secondary | ICD-10-CM | POA: Diagnosis not present

## 2014-01-16 DIAGNOSIS — I700 Atherosclerosis of aorta: Secondary | ICD-10-CM | POA: Diagnosis present

## 2014-01-16 DIAGNOSIS — R55 Syncope and collapse: Principal | ICD-10-CM

## 2014-01-16 DIAGNOSIS — M179 Osteoarthritis of knee, unspecified: Secondary | ICD-10-CM | POA: Diagnosis present

## 2014-01-16 DIAGNOSIS — B9620 Unspecified Escherichia coli [E. coli] as the cause of diseases classified elsewhere: Secondary | ICD-10-CM | POA: Diagnosis present

## 2014-01-16 DIAGNOSIS — Z7901 Long term (current) use of anticoagulants: Secondary | ICD-10-CM

## 2014-01-16 DIAGNOSIS — Z885 Allergy status to narcotic agent status: Secondary | ICD-10-CM

## 2014-01-16 DIAGNOSIS — Z96652 Presence of left artificial knee joint: Secondary | ICD-10-CM | POA: Diagnosis present

## 2014-01-16 DIAGNOSIS — I10 Essential (primary) hypertension: Secondary | ICD-10-CM

## 2014-01-16 DIAGNOSIS — E039 Hypothyroidism, unspecified: Secondary | ICD-10-CM | POA: Diagnosis not present

## 2014-01-16 DIAGNOSIS — E785 Hyperlipidemia, unspecified: Secondary | ICD-10-CM | POA: Diagnosis present

## 2014-01-16 DIAGNOSIS — N390 Urinary tract infection, site not specified: Secondary | ICD-10-CM | POA: Diagnosis present

## 2014-01-16 DIAGNOSIS — J9601 Acute respiratory failure with hypoxia: Secondary | ICD-10-CM | POA: Diagnosis present

## 2014-01-16 HISTORY — DX: Acute embolism and thrombosis of unspecified deep veins of unspecified lower extremity: I82.409

## 2014-01-16 HISTORY — DX: Other pulmonary embolism without acute cor pulmonale: I26.99

## 2014-01-16 LAB — CBC
HCT: 41.7 % (ref 36.0–46.0)
Hemoglobin: 13.4 g/dL (ref 12.0–15.0)
MCH: 30.3 pg (ref 26.0–34.0)
MCHC: 32.1 g/dL (ref 30.0–36.0)
MCV: 94.3 fL (ref 78.0–100.0)
Platelets: 161 10*3/uL (ref 150–400)
RBC: 4.42 MIL/uL (ref 3.87–5.11)
RDW: 13.9 % (ref 11.5–15.5)
WBC: 5.7 10*3/uL (ref 4.0–10.5)

## 2014-01-16 LAB — URINALYSIS, ROUTINE W REFLEX MICROSCOPIC
BILIRUBIN URINE: NEGATIVE
Glucose, UA: NEGATIVE mg/dL
Ketones, ur: NEGATIVE mg/dL
NITRITE: NEGATIVE
PH: 5.5 (ref 5.0–8.0)
Protein, ur: NEGATIVE mg/dL
SPECIFIC GRAVITY, URINE: 1.017 (ref 1.005–1.030)
Urobilinogen, UA: 0.2 mg/dL (ref 0.0–1.0)

## 2014-01-16 LAB — TSH: TSH: 0.892 u[IU]/mL (ref 0.350–4.500)

## 2014-01-16 LAB — BASIC METABOLIC PANEL
Anion gap: 8 (ref 5–15)
BUN: 17 mg/dL (ref 6–23)
CALCIUM: 9.2 mg/dL (ref 8.4–10.5)
CO2: 28 mmol/L (ref 19–32)
CREATININE: 0.75 mg/dL (ref 0.50–1.10)
Chloride: 102 mEq/L (ref 96–112)
GFR calc Af Amer: >90 mL/min (ref >90)
GFR calc non Af Amer: 78 mL/min — ABNORMAL LOW (ref >90)
GLUCOSE: 107 mg/dL — AB (ref 70–99)
Potassium: 4.3 mmol/L (ref 3.5–5.1)
SODIUM: 138 mmol/L (ref 135–145)

## 2014-01-16 LAB — URINE MICROSCOPIC-ADD ON

## 2014-01-16 LAB — I-STAT TROPONIN, ED: Troponin i, poc: 0 ng/mL (ref 0.00–0.08)

## 2014-01-16 LAB — BRAIN NATRIURETIC PEPTIDE: B NATRIURETIC PEPTIDE 5: 147.5 pg/mL — AB (ref 0.0–100.0)

## 2014-01-16 LAB — TROPONIN I

## 2014-01-16 MED ORDER — ACETAMINOPHEN 500 MG PO TABS: 1000.0000 mg | ORAL_TABLET | Freq: Four times a day (QID) | ORAL | Status: DC | PRN

## 2014-01-16 MED ORDER — IOHEXOL 350 MG/ML SOLN
100.0000 mL | Freq: Once | INTRAVENOUS | Status: AC | PRN
  Administered 2014-01-16: 100 mL via INTRAVENOUS

## 2014-01-16 MED ORDER — IPRATROPIUM-ALBUTEROL 0.5-2.5 (3) MG/3ML IN SOLN: 3.0000 mL | RESPIRATORY_TRACT | Status: DC | PRN

## 2014-01-16 MED ORDER — ASPIRIN 81 MG PO CHEW
324.0000 mg | CHEWABLE_TABLET | Freq: Once | ORAL | Status: AC
  Administered 2014-01-16: 324 mg via ORAL
  Filled 2014-01-16: qty 4

## 2014-01-16 MED ORDER — RIVAROXABAN 20 MG PO TABS
20.0000 mg | ORAL_TABLET | Freq: Every day | ORAL | Status: DC
  Administered 2014-01-16 – 2014-01-18 (×3): 20 mg via ORAL
  Filled 2014-01-16 (×3): qty 1

## 2014-01-16 MED ORDER — ONDANSETRON HCL 4 MG PO TABS: 4.0000 mg | ORAL_TABLET | Freq: Four times a day (QID) | ORAL | Status: DC | PRN

## 2014-01-16 MED ORDER — LEVOTHYROXINE SODIUM 100 MCG PO TABS
100.0000 ug | ORAL_TABLET | Freq: Every day | ORAL | Status: DC
  Administered 2014-01-17 – 2014-01-18 (×2): 100 ug via ORAL
  Filled 2014-01-16 (×2): qty 1

## 2014-01-16 MED ORDER — ATORVASTATIN CALCIUM 20 MG PO TABS
20.0000 mg | ORAL_TABLET | Freq: Every evening | ORAL | Status: DC
  Administered 2014-01-16 – 2014-01-18 (×3): 20 mg via ORAL
  Filled 2014-01-16 (×4): qty 1

## 2014-01-16 MED ORDER — ONDANSETRON HCL 4 MG/2ML IJ SOLN: 4.0000 mg | Freq: Four times a day (QID) | INTRAMUSCULAR | Status: DC | PRN

## 2014-01-16 MED ORDER — SODIUM CHLORIDE 0.9 % IV BOLUS (SEPSIS)
500.0000 mL | Freq: Once | INTRAVENOUS | Status: AC
  Administered 2014-01-16: 500 mL via INTRAVENOUS

## 2014-01-16 MED ORDER — SODIUM CHLORIDE 0.9 % IV SOLN
INTRAVENOUS | Status: DC
  Administered 2014-01-16 – 2014-01-18 (×2): via INTRAVENOUS

## 2014-01-16 MED ORDER — VITAMIN C 500 MG PO TABS
500.0000 mg | ORAL_TABLET | Freq: Every day | ORAL | Status: DC
  Administered 2014-01-16 – 2014-01-18 (×3): 500 mg via ORAL
  Filled 2014-01-16 (×3): qty 1

## 2014-01-16 MED ORDER — LORATADINE 10 MG PO TABS
10.0000 mg | ORAL_TABLET | Freq: Every day | ORAL | Status: DC
  Administered 2014-01-16 – 2014-01-18 (×3): 10 mg via ORAL
  Filled 2014-01-16 (×3): qty 1

## 2014-01-16 MED ORDER — ADULT MULTIVITAMIN W/MINERALS CH
1.0000 | ORAL_TABLET | Freq: Every day | ORAL | Status: DC
  Administered 2014-01-16 – 2014-01-18 (×3): 1 via ORAL
  Filled 2014-01-16 (×3): qty 1

## 2014-01-16 MED ORDER — TRAZODONE HCL 50 MG PO TABS
50.0000 mg | ORAL_TABLET | Freq: Every day | ORAL | Status: DC
  Administered 2014-01-16 – 2014-01-17 (×2): 50 mg via ORAL
  Filled 2014-01-16 (×5): qty 1

## 2014-01-16 MED ORDER — VITAMIN D3 25 MCG (1000 UNIT) PO TABS
2000.0000 [IU] | ORAL_TABLET | Freq: Every day | ORAL | Status: DC
  Administered 2014-01-17 – 2014-01-18 (×2): 2000 [IU] via ORAL
  Filled 2014-01-16 (×2): qty 2

## 2014-01-16 MED ORDER — FAMOTIDINE 20 MG PO TABS
20.0000 mg | ORAL_TABLET | Freq: Every day | ORAL | Status: DC
  Administered 2014-01-17 – 2014-01-18 (×2): 20 mg via ORAL
  Filled 2014-01-16 (×2): qty 1

## 2014-01-16 MED ORDER — VITAMIN D 50 MCG (2000 UT) PO CAPS: 1.0000 | ORAL_CAPSULE | Freq: Every day | ORAL | Status: DC

## 2014-01-16 NOTE — H&P (Signed)
[facility] History and Physical  Whitney Medina IOX:[id number] DOB: [birth year] DOA: 01/16/2014  Referring physician: ER physician PCP: Tivis Ringer, MD   Chief Complaint: chest pain, shortness of breath, syncope   HPI:  80 year old female with past medical history of pulmonary embolism on anticoagulation with xarelto, has IVC filter, dyslipidemia, hypothyroidism who presented to [facility] ED reports of syncopal episode at home. Patient reported having chest pain and shortness of breath prior to syncope. No palpitations. Patient unsure how long unconsciousness lasted. No reports of abdominal pain, nausea or vomiting. She reported having fever last nite but no other complaints such as cough. No urinary complaints.  In ED, BP was 103-61, HR 42-126, RR 12-22, T max 98.3 F and oxygen saturation 77 - 99% with Angus oxygen support. Blood work was unremarkable. CT scan did not show acute pulmonary embolism. CXR showed calcified granuloma right lower lung zone, no edema or consolidation. The 12 lead EKG showed atrial fibrillation. Herat rate spontaneously improved to 42. She was admitted to telemetry for further evaluation.   Assessment & Plan    Principal Problem:   Syncope / chest pain / atrial fibrillation  - unclear etiology; possible atrial fibrillation as a cause - will cycle cardiac enzymes - the admission 12 lead EKG showed atrial fibrillation but currently heart rate controlled (even on bradycardiac side 42 so she may have tachy brady) - obtain 2 D ECHO - get cardiology consult in am if HR continues to fluctuate  - PT evaluation   Active Problems:   Acute respiratory failure with hypoxia / Pulmonary embolism / Lung granuloma - transient episode of hypoxia, likely due to history of PE, lung granuloma - her CT scan on this admission did not show pulmonary embolism - we will resume anticoagulation with xarelto  - may use duoneb as needed for shortness of breath or wheezing     Hypothyroidism - check TSH level - resume synthroid    Hypercholesteremia - resume statin therapy    DVT prophylaxis:   On anticoagulation with xarelto   Radiological Exams on Admission: Ct Angio Chest Pe W/cm &/or Wo Cm 01/16/2014   1. Negative for pulmonary embolus or acute vascular abnormality. 2. Atherosclerosis and coronary artery disease. 3. Unchanged ascending aortic ectasia measuring 38 mm x 40 mm. Recommend annual imaging followup by CTA or MRA. This recommendation follows 2010 ACCF/AHA/AATS/ACR/ASA/SCA/SCAI/SIR/STS/SVM Guidelines for the Diagnosis and Management of Patients With Thoracic Aortic Disease. Circulation. 2010; 121: Q683-M196 4. Old granulomatous disease.   Electronically Signed   By: Dereck Ligas M.D.   On: 01/16/2014 11:49   Dg Chest Port 1 View 01/16/2014  Calcified granuloma right lower lung zone. No edema or consolidation.      EKG: atrial fibrillation   Code Status: Full Family Communication: Plan of care discussed with the patient  Disposition Plan: Admit for further evaluation  Leisa Lenz, MD  [facility] Pager [phone]  Review of Systems:  Constitutional: Negative for fever, chills and malaise/fatigue. Negative for diaphoresis.  HENT: Negative for hearing loss, ear pain, nosebleeds, congestion, sore throat, neck pain, tinnitus and ear discharge.   Eyes: Negative for blurred vision, double vision, photophobia, pain, discharge and redness.  Respiratory: Negative for cough, hemoptysis, sputum production, shortness of breath, wheezing and stridor.   Cardiovascular: Negative for orthopnea, claudication and leg swelling.  Gastrointestinal: Negative for nausea, vomiting and abdominal pain. Negative for heartburn, constipation, blood in stool and melena.  Genitourinary: Negative for dysuria, urgency, frequency, hematuria and flank pain.  Musculoskeletal: Negative for myalgias, back pain, joint pain and falls.  Skin: Negative for itching and rash.   Neurological: per HPI  Endo/Heme/Allergies: Negative for environmental allergies and polydipsia. Does not bruise/bleed easily.  Psychiatric/Behavioral: Negative for suicidal ideas. The patient is not nervous/anxious.      Past Medical History  Diagnosis Date  . Hypothyroidism   . Hypercholesteremia   . Left knee DJD   . PONV (postoperative nausea and vomiting)   . History of anemia   . DVT (deep venous thrombosis)   . PE (pulmonary embolism)    Past Surgical History  Procedure Laterality Date  . Abdominal hysterectomy  1973  . Tonsillectomy  1961  . Shoulder adhesion release Left 2000  . Knee arthroscopy Left   . Total knee arthroplasty Left 12/25/2012    Procedure: TOTAL KNEE ARTHROPLASTY- left;  Surgeon: Lorn Junes, MD;  Location: [place];  Service: Orthopedics;  Laterality: Left;   Social History:  reports that she has never smoked. She has never used smokeless tobacco. She reports that she does not drink alcohol or use illicit drugs.  Allergies  Allergen Reactions  . Codeine Nausea And Vomiting    Percocet OK    Family History:  Family History  Problem Relation Age of Onset  . Cancer - Other Mother   . Melanoma Sister   . Heart disease Sister      Prior to Admission medications   Medication Sig Start Date End Date Taking? Authorizing Provider  acetaminophen (TYLENOL) 500 MG tablet Take 1,000 mg by mouth every 6 (six) hours as needed for moderate pain or fever (fever).    Yes Historical Provider, MD  atorvastatin (LIPITOR) 20 MG tablet Take 20 mg by mouth every evening.   Yes Historical Provider, MD  Calcium Carb-Cholecalciferol (CALCIUM 1000 + D PO) Take 1 tablet by mouth 2 (two) times daily.    Yes Historical Provider, MD  Cholecalciferol (VITAMIN D) 2000 UNITS CAPS Take 1 capsule by mouth daily.   Yes Historical Provider, MD  levothyroxine (SYNTHROID, LEVOTHROID) 100 MCG tablet Take 100 mcg by mouth daily before breakfast.   Yes Historical Provider, MD   loratadine (CLARITIN) 10 MG tablet Take 10 mg by mouth daily.   Yes Historical Provider, MD  Multiple Vitamins-Minerals (MULTIVITAMIN WITH MINERALS) tablet Take 1 tablet by mouth daily.   Yes Historical Provider, MD  ranitidine (ZANTAC) 150 MG tablet Take 150 mg by mouth daily.   Yes Historical Provider, MD  rivaroxaban (XARELTO) 20 MG TABS tablet Take 1 tablet (20 mg total) by mouth daily with supper. 10/04/13  Yes Rigoberto Noel, MD  traZODone (DESYREL) 50 MG tablet Take 50 mg by mouth at bedtime.   Yes Historical Provider, MD  vitamin C (ASCORBIC ACID) 500 MG tablet Take 500 mg by mouth daily.    Yes Historical Provider, MD   Physical Exam: Filed Vitals:   01/16/14 1129 01/16/14 1200 01/16/14 1230 01/16/14 1300  BP: 124/73 114/77 115/78 103/61  Pulse: 106 95 126 80  Temp:      TempSrc:      Resp: 12 16 18 15   SpO2: 99% 98% 97% 96%    Physical Exam  Constitutional: Appears well-developed and well-nourished. No distress.  HENT: Normocephalic. No tonsillar erythema or exudates Eyes: Conjunctivae and EOM are normal. PERRLA, no scleral icterus.  Neck: Normal ROM. Neck supple. No JVD. No tracheal deviation. No thyromegaly.  CVS: irregular rhythm, tachycardia, S1/S2 appreciated  Pulmonary: Effort and breath sounds  normal, no stridor, rhonchi, wheezes, rales.  Abdominal: Soft. BS +,  no distension, tenderness, rebound or guarding.  Musculoskeletal: Normal range of motion. No edema and no tenderness.  Lymphadenopathy: No lymphadenopathy noted, cervical, inguinal. Neuro: Alert. Normal reflexes, muscle tone coordination. No focal neurologic deficits. Skin: Skin is warm and dry. No rash noted.   Psychiatric: Normal mood and affect. Behavior, judgment, thought content normal.   Labs on Admission:  Basic Metabolic Panel:  Recent Labs Lab 01/16/14 1008  NA 138  K 4.3  CL 102  CO2 28  GLUCOSE 107*  BUN 17  CREATININE 0.75  CALCIUM 9.2   Liver Function Tests: No results for  input(s): AST, ALT, ALKPHOS, BILITOT, PROT, ALBUMIN in the last 168 hours. No results for input(s): LIPASE, AMYLASE in the last 168 hours. No results for input(s): AMMONIA in the last 168 hours. CBC:  Recent Labs Lab 01/16/14 1008  WBC 5.7  HGB 13.4  HCT 41.7  MCV 94.3  PLT 161   Cardiac Enzymes: No results for input(s): CKTOTAL, CKMB, CKMBINDEX, TROPONINI in the last 168 hours. BNP: Invalid input(s): POCBNP CBG: No results for input(s): GLUCAP in the last 168 hours.  If 7PM-7AM, please contact night-coverage [email or web address] Password [email or web address] 01/16/2014, 1:53 PM

## 2014-01-16 NOTE — Progress Notes (Signed)
  Echocardiogram 2D Echocardiogram has been performed.  [name] 01/16/2014, 4:03 PM

## 2014-01-16 NOTE — ED Notes (Signed)
Pt c/o central chest heaviness that has been constant since it started yesterday morning.  Pt stated that this am when she was coming out of the bathroom she washed her hands and then woke up on the hardwood floor.   Pt states that she is having to take shallow breathes due to the chest pain, to keep from hurting so bad.  Pt states that she has had a flu shot.

## 2014-01-16 NOTE — ED Notes (Addendum)
Pt states that she takes xeralto for PE and DVT.  Pt states that she has 102 fever last night.

## 2014-01-16 NOTE — ED Notes (Signed)
RN made aware by CNA that patient's heart rate was rapid and irregular. Monitor showed patient's HR to fluctuate between 90-140 with an average of 100-120. Pt denies any new symptoms, c/o chest pain only, denies dizziness, denies Hx of atrial fibrillation. Skin is warm and dry, no apparent distress. Regenia Skeeter, MD, made aware.

## 2014-01-16 NOTE — ED Provider Notes (Signed)
CSN: [id number]     Arrival date & time 01/16/14  5732 History   First MD Initiated Contact with Patient 01/16/14 1006     Chief Complaint  Patient presents with  . Chest Pain  . Loss of Consciousness     (Consider location/radiation/quality/duration/timing/severity/associated sxs/prior Treatment) HPI  80 year old female presents after a syncopal episode this morning. Yesterday/last night she started feeling chest heaviness that is mild but increases in severity with inspiration. Denies a leg swelling, leg pain, or shortness of breath. Noticed a fever of 102 last night. Has noticed a dry cough. Patient went to the bathroom this morning and after washing her hands and getting ready to leave the bathroom she noticed that her vision went black and then she woke up laying on the floor. The chest pain did not increase at that time but is still present at a low level. 11 months ago she was diagnosed with a pulmonary embolism and has been on Xarelto since. She also had an IVC filter placed. Has not missed any of her medicines. Did not feel any dizziness or lightheadedness prior to syncope.    Past Medical History  Diagnosis Date  . Hypothyroidism   . Hypercholesteremia   . Left knee DJD   . PONV (postoperative nausea and vomiting)   . History of anemia   . DVT (deep venous thrombosis)   . PE (pulmonary embolism)    Past Surgical History  Procedure Laterality Date  . Abdominal hysterectomy  1973  . Tonsillectomy  1961  . Shoulder adhesion release Left 2000  . Knee arthroscopy Left   . Total knee arthroplasty Left 12/25/2012    Procedure: TOTAL KNEE ARTHROPLASTY- left;  Surgeon: Lorn Junes, MD;  Location: [facility];  Service: Orthopedics;  Laterality: Left;   Family History  Problem Relation Age of Onset  . Cancer - Other Mother   . Melanoma Sister   . Heart disease Sister    History  Substance Use Topics  . Smoking status: Never Smoker   . Smokeless tobacco: Never Used  . Alcohol  Use: No     Comment: Rare glass of wine   OB History    No data available     Review of Systems  Constitutional: Positive for fever.  Respiratory: Positive for cough. Negative for shortness of breath.   Cardiovascular: Positive for chest pain. Negative for leg swelling.  Gastrointestinal: Negative for vomiting, abdominal pain, diarrhea and blood in stool.  Genitourinary: Negative for dysuria.  Neurological: Positive for syncope. Negative for dizziness, weakness and numbness.  All other systems reviewed and are negative.     Allergies  Codeine  Home Medications   Prior to Admission medications   Medication Sig Start Date End Date Taking? Authorizing Provider  acetaminophen (TYLENOL) 500 MG tablet Take 1,000 mg by mouth every 6 (six) hours as needed for mild pain or moderate pain.    Historical Provider, MD  atorvastatin (LIPITOR) 20 MG tablet Take 20 mg by mouth every evening.    Historical Provider, MD  Calcium Carb-Cholecalciferol (CALCIUM 1000 + D PO) Take 1 tablet by mouth daily.    Historical Provider, MD  Cholecalciferol (VITAMIN D) 2000 UNITS CAPS Take 1 capsule by mouth daily.    Historical Provider, MD  levothyroxine (SYNTHROID, LEVOTHROID) 100 MCG tablet Take 100 mcg by mouth daily before breakfast.    Historical Provider, MD  loratadine (CLARITIN) 10 MG tablet Take 10 mg by mouth daily.    Historical Provider,  MD  Multiple Vitamins-Minerals (MULTIVITAMIN WITH MINERALS) tablet Take 1 tablet by mouth daily.    Historical Provider, MD  ranitidine (ZANTAC) 150 MG tablet Take 150 mg by mouth daily.    Historical Provider, MD  rivaroxaban (XARELTO) 20 MG TABS tablet Take 20 mg by mouth daily with supper.    Historical Provider, MD  rivaroxaban (XARELTO) 20 MG TABS tablet Take 1 tablet (20 mg total) by mouth daily with supper. 10/04/13   Rigoberto Noel, MD  traZODone (DESYREL) 50 MG tablet Take 50 mg by mouth at bedtime.    Historical Provider, MD  vitamin C (ASCORBIC ACID) 500  MG tablet Take 500 mg by mouth daily.     Historical Provider, MD   BP 128/63 mmHg  Pulse 87  Temp(Src) 98.3 F (36.8 C) (Oral)  Resp 18  SpO2 98% Physical Exam  Constitutional: She is oriented to person, place, and time. She appears well-developed and well-nourished. No distress.  HENT:  Head: Normocephalic and atraumatic.  Right Ear: External ear normal.  Left Ear: External ear normal.  Nose: Nose normal.  Mouth/Throat: Oropharynx is clear and moist.  Eyes: EOM are normal. Pupils are equal, round, and reactive to light. Right eye exhibits no discharge. Left eye exhibits no discharge.  Cardiovascular: Normal rate, regular rhythm and normal heart sounds.   Pulmonary/Chest: Effort normal and breath sounds normal. She has no wheezes. She has no rales.  Abdominal: Soft. There is no tenderness.  Musculoskeletal: She exhibits no edema or tenderness.  Neurological: She is alert and oriented to person, place, and time.  CN 2-12 grossly intact. 5/5 strength in all 4 extremities. Normal gross sensation  Skin: Skin is warm and dry. She is not diaphoretic.  Nursing note and vitals reviewed.   ED Course  Procedures (including critical care time) Labs Review Labs Reviewed  BASIC METABOLIC PANEL - Abnormal; Notable for the following:    Glucose, Bld 107 (*)    GFR calc non Af Amer 78 (*)    All other components within normal limits  BRAIN NATRIURETIC PEPTIDE - Abnormal; Notable for the following:    B Natriuretic Peptide 147.5 (*)    All other components within normal limits  URINALYSIS, ROUTINE W REFLEX MICROSCOPIC - Abnormal; Notable for the following:    Hgb urine dipstick SMALL (*)    Leukocytes, UA SMALL (*)    All other components within normal limits  URINE MICROSCOPIC-ADD ON - Abnormal; Notable for the following:    Bacteria, UA MANY (*)    All other components within normal limits  URINE CULTURE  CBC  I-STAT TROPOININ, ED    Imaging Review Ct Angio Chest Pe W/cm &/or Wo  Cm  01/16/2014   CLINICAL DATA:  Central chest pain and heaviness. Onset of symptoms this morning. Syncopal episode. Patient found down. Pulmonary embolism. Initial encounter.  EXAM: CT ANGIOGRAPHY CHEST WITH CONTRAST  TECHNIQUE: Multidetector CT imaging of the chest was performed using the standard protocol during bolus administration of intravenous contrast. Multiplanar CT image reconstructions and MIPs were obtained to evaluate the vascular anatomy.  CONTRAST:  140mL OMNIPAQUE IOHEXOL 350 MG/ML SOLN  COMPARISON:  Chest radiograph today. Chest CT 10/01/2013. Chest CT 02/14/2013.  FINDINGS: Bones: No aggressive osseous lesions. Age expected thoracic degenerative disc disease.  Cardiovascular: Coronary artery atherosclerosis is present. If office based assessment of coronary risk factors has not been performed, it is now recommended. There is no pulmonary embolism identified. No acute aortic abnormality. Aortic arch  atherosclerosis.  Ascending aorta measures 38 mm x 40 mm, similar to prior.  Lungs: Dependent atelectasis. Calcified granuloma in the RIGHT lower lobe. No airspace disease. Mild lingular scarring.  Central airways: Patent.  Effusions: None.  Lymphadenopathy: None.  Esophagus: Normal.  Upper abdomen: Normal.  Other: None.  Review of the MIP images confirms the above findings.  IMPRESSION: 1. Negative for pulmonary embolus or acute vascular abnormality. 2. Atherosclerosis and coronary artery disease. 3. Unchanged ascending aortic ectasia measuring 38 mm x 40 mm. Recommend annual imaging followup by CTA or MRA. This recommendation follows 2010 ACCF/AHA/AATS/ACR/ASA/SCA/SCAI/SIR/STS/SVM Guidelines for the Diagnosis and Management of Patients With Thoracic Aortic Disease. Circulation. 2010; 121: D532-D924 4. Old granulomatous disease.   Electronically Signed   By: Dereck Ligas M.D.   On: 01/16/2014 11:49   Dg Chest Port 1 View  01/16/2014   CLINICAL DATA:  Chest heaviness for 1 day  EXAM: PORTABLE  CHEST - 1 VIEW  COMPARISON:  Chest radiograph December 18, 2012 and chest CT October 01, 2013  FINDINGS: There is a calcified granuloma in the right lower lung zone. There is no edema or consolidation. The heart size and pulmonary vascularity are normal. No adenopathy. No bone lesions.  IMPRESSION: Calcified granuloma right lower lung zone. No edema or consolidation.   Electronically Signed   By: Lowella Grip M.D.   On: 01/16/2014 10:47     EKG Interpretation   Date/Time:  Wednesday January 16 2014 09:50:36 EST Ventricular Rate:  87 PR Interval:  133 QRS Duration: 81 QT Interval:  411 QTC Calculation: 494 R Axis:   61 Text Interpretation:  Sinus rhythm Borderline T wave abnormalities  Borderline prolonged QT interval nonpsecific ST segments improved from Jan  2015 Confirmed by Regenia Skeeter  MD, [place] [phone]) on 01/16/2014 10:06:57 AM      MDM   Final diagnoses:  Vasovagal syncope  Atrial fibrillation, unspecified    Patient is well appearing here, no acute symptoms except mild chest pain only with breathing. CTA negative for recurrent PE. Initial troponin negative. While in ED she had intermittent bouts of afib with RVR that appears new. D/w Dr. Claiborne Billings, recommends hospitalist admission here at [facility] and they will consult if needed. RVR is not sustained, and she is typically rate controlled in ED, will admit to telemetry with hospitalist.    Ephraim Hamburger, MD 01/17/14 [phone]

## 2014-01-17 DIAGNOSIS — I2699 Other pulmonary embolism without acute cor pulmonale: Secondary | ICD-10-CM

## 2014-01-17 DIAGNOSIS — I4891 Unspecified atrial fibrillation: Secondary | ICD-10-CM | POA: Diagnosis present

## 2014-01-17 LAB — CBC
HCT: 37.7 % (ref 36.0–46.0)
Hemoglobin: 12.2 g/dL (ref 12.0–15.0)
MCH: 30.6 pg (ref 26.0–34.0)
MCHC: 32.4 g/dL (ref 30.0–36.0)
MCV: 94.5 fL (ref 78.0–100.0)
PLATELETS: 147 10*3/uL — AB (ref 150–400)
RBC: 3.99 MIL/uL (ref 3.87–5.11)
RDW: 13.9 % (ref 11.5–15.5)
WBC: 4.9 10*3/uL (ref 4.0–10.5)

## 2014-01-17 LAB — COMPREHENSIVE METABOLIC PANEL
ALBUMIN: 3.5 g/dL (ref 3.5–5.2)
ALT: 16 U/L (ref 0–35)
ANION GAP: 5 (ref 5–15)
AST: 19 U/L (ref 0–37)
Alkaline Phosphatase: 54 U/L (ref 39–117)
BUN: 15 mg/dL (ref 6–23)
CALCIUM: 8.6 mg/dL (ref 8.4–10.5)
CO2: 29 mmol/L (ref 19–32)
CREATININE: 0.68 mg/dL (ref 0.50–1.10)
Chloride: 105 mEq/L (ref 96–112)
GFR, EST NON AFRICAN AMERICAN: 81 mL/min — AB (ref >90)
Glucose, Bld: 96 mg/dL (ref 70–99)
POTASSIUM: 4 mmol/L (ref 3.5–5.1)
SODIUM: 139 mmol/L (ref 135–145)
Total Bilirubin: 0.8 mg/dL (ref 0.3–1.2)
Total Protein: 6.1 g/dL (ref 6.0–8.3)

## 2014-01-17 LAB — GLUCOSE, CAPILLARY: Glucose-Capillary: 100 mg/dL — ABNORMAL HIGH (ref 70–99)

## 2014-01-17 LAB — TROPONIN I: Troponin I: <0.03 ng/mL (ref <0.031)

## 2014-01-17 MED ORDER — PHENAZOPYRIDINE HCL 100 MG PO TABS
100.0000 mg | ORAL_TABLET | Freq: Three times a day (TID) | ORAL | Status: DC
  Administered 2014-01-18 (×3): 100 mg via ORAL
  Filled 2014-01-17 (×3): qty 1

## 2014-01-17 MED ORDER — DEXTROSE 5 % IV SOLN
5.0000 mg/h | INTRAVENOUS | Status: DC
  Administered 2014-01-17: 12.5 mg/h via INTRAVENOUS
  Administered 2014-01-17: 5 mg/h via INTRAVENOUS
  Administered 2014-01-18: 12.5 mg/h via INTRAVENOUS
  Filled 2014-01-17 (×2): qty 100

## 2014-01-17 MED ORDER — CEFTRIAXONE SODIUM IN DEXTROSE 20 MG/ML IV SOLN
1.0000 g | INTRAVENOUS | Status: DC
  Administered 2014-01-17 – 2014-01-18 (×2): 1 g via INTRAVENOUS
  Filled 2014-01-17 (×2): qty 50

## 2014-01-17 MED ORDER — ENSURE COMPLETE PO LIQD
237.0000 mL | Freq: Two times a day (BID) | ORAL | Status: DC
  Administered 2014-01-17 – 2014-01-18 (×3): 237 mL via ORAL

## 2014-01-17 MED ORDER — DILTIAZEM HCL 100 MG IV SOLR: 5.0000 mg/h | INTRAVENOUS | Status: DC

## 2014-01-17 NOTE — Consult Note (Signed)
CARDIOLOGY CONSULT NOTE   Patient ID: Whitney Medina MRN: [id number], DOB/AGE: September 06, 1932   Admit date: 01/16/2014 Date of Consult: 01/17/2014   Primary Physician: Tivis Ringer, MD Primary Cardiologist: None  Pt. Profile  80 year old woman admitted with syncope and found to be in atrial fibrillation.  Problem List  Past Medical History  Diagnosis Date  . Hypothyroidism   . Hypercholesteremia   . Left knee DJD   . PONV (postoperative nausea and vomiting)   . History of anemia   . DVT (deep venous thrombosis)   . PE (pulmonary embolism)     Past Surgical History  Procedure Laterality Date  . Abdominal hysterectomy  1973  . Tonsillectomy  1961  . Shoulder adhesion release Left 2000  . Knee arthroscopy Left   . Total knee arthroplasty Left 12/25/2012    Procedure: TOTAL KNEE ARTHROPLASTY- left;  Surgeon: Lorn Junes, MD;  Location: [facility];  Service: Orthopedics;  Laterality: Left;     Allergies  Allergies  Allergen Reactions  . Codeine Nausea And Vomiting    Percocet OK    HPI   This 80 year old woman was admitted on 01/16/14 after suffering an episode of syncope at home.  She does not have any prior history of syncope.  She does have a past history of pulmonary embolism following knee surgery last January.  She was treated with Xarelto and with an inferior vena cava filter.  The IVC filter was subsequently removed.  The patient remained on Xarelto for 6 months after which it was stopped.  It was then restarted in September 2015 by her physicians.  She was on Xarelto leading up to this admission and had been told that she would be on it for 6 months starting in September 2015.  She has not been aware of any irregular heart rate or previous atrial fibrillation.  Yesterday morning she got up early in the morning to go to the bathroom.  Following that after washing her hands she was walking back to her bed and suddenly lost her vision and blacked out.  She  suffered minor contusion of the left orbital area but no other significant injuries.  She is not aware as to how long she was unresponsive.  She denied any awareness of chest discomfort associated with the episode of syncope.  The patient does not have any history of known ischemic heart disease.  On the day prior to her episode of syncope she had experienced some chest heaviness.  Cardiac enzymes this admission have been negative 3 The patient is not aware of any history of atrial fibrillation in the family.  Her father lived to 7.  Her mother died in her 47s of liver cancer.  Inpatient Medications  . atorvastatin  20 mg Oral QPM  . cholecalciferol  2,000 Units Oral Daily  . famotidine  20 mg Oral Daily  . levothyroxine  100 mcg Oral QAC breakfast  . loratadine  10 mg Oral Daily  . multivitamin with minerals  1 tablet Oral Daily  . rivaroxaban  20 mg Oral Q supper  . traZODone  50 mg Oral QHS  . vitamin C  500 mg Oral Daily    Family History Family History  Problem Relation Age of Onset  . Cancer - Other Mother   . Melanoma Sister   . Heart disease Sister      Social History History   Social History  . Marital Status: Widowed    Spouse Name: N/A  Number of Children: N/A  . Years of Education: N/A   Occupational History  . Not on file.   Social History Main Topics  . Smoking status: Never Smoker   . Smokeless tobacco: Never Used  . Alcohol Use: No     Comment: Rare glass of wine  . Drug Use: No  . Sexual Activity: Not on file   Other Topics Concern  . Not on file   Social History Narrative     Review of Systems  General:  No chills, fever, night sweats or weight changes.  Cardiovascular:  No chest pain, dyspnea on exertion, edema, orthopnea, palpitations, paroxysmal nocturnal dyspnea. Dermatological: No rash, lesions/masses Respiratory: No cough, dyspnea Urologic: No hematuria, dysuria Abdominal:   No nausea, vomiting, diarrhea, bright red blood per  rectum, melena, or hematemesis Neurologic:  No visual changes, wkns, changes in mental status. All other systems reviewed and are otherwise negative except as noted above.  Physical Exam  Blood pressure 118/69, pulse 87, temperature 99.3 F (37.4 C), temperature source Oral, resp. rate 20, height 5\' 10"  (1.778 m), weight 147 lb 3.2 oz (66.769 kg), SpO2 93 %.  General: Pleasant, NAD Psych: Normal affect. Neuro: Alert and oriented X 3. Moves all extremities spontaneously. HEENT: Normal  Neck: Supple without bruits or JVD. Lungs:  Resp regular and unlabored, CTA. Heart: Rapid irregular heart rate.  No murmur gallop or rub. Abdomen: Soft, non-tender, non-distended, BS + x 4.  Extremities: No clubbing, cyanosis or edema. DP/PT/Radials 2+ and equal bilaterally.  Labs   Recent Labs  01/16/14 1710 01/16/14 2243 01/17/14 0505  TROPONINI <0.03 <0.03 <0.03   Lab Results  Component Value Date   WBC 4.9 01/17/2014   HGB 12.2 01/17/2014   HCT 37.7 01/17/2014   MCV 94.5 01/17/2014   PLT 147* 01/17/2014     Recent Labs Lab 01/17/14 0505  NA 139  K 4.0  CL 105  CO2 29  BUN 15  CREATININE 0.68  CALCIUM 8.6  PROT 6.1  BILITOT 0.8  ALKPHOS 54  ALT 16  AST 19  GLUCOSE 96   No results found for: CHOL, HDL, LDLCALC, TRIG No results found for: DDIMER  Radiology/Studies  Ct Angio Chest Pe W/cm &/or Wo Cm  01/16/2014   CLINICAL DATA:  Central chest pain and heaviness. Onset of symptoms this morning. Syncopal episode. Patient found down. Pulmonary embolism. Initial encounter.  EXAM: CT ANGIOGRAPHY CHEST WITH CONTRAST  TECHNIQUE: Multidetector CT imaging of the chest was performed using the standard protocol during bolus administration of intravenous contrast. Multiplanar CT image reconstructions and MIPs were obtained to evaluate the vascular anatomy.  CONTRAST:  119mL OMNIPAQUE IOHEXOL 350 MG/ML SOLN  COMPARISON:  Chest radiograph today. Chest CT 10/01/2013. Chest CT 02/14/2013.   FINDINGS: Bones: No aggressive osseous lesions. Age expected thoracic degenerative disc disease.  Cardiovascular: Coronary artery atherosclerosis is present. If office based assessment of coronary risk factors has not been performed, it is now recommended. There is no pulmonary embolism identified. No acute aortic abnormality. Aortic arch atherosclerosis.  Ascending aorta measures 38 mm x 40 mm, similar to prior.  Lungs: Dependent atelectasis. Calcified granuloma in the RIGHT lower lobe. No airspace disease. Mild lingular scarring.  Central airways: Patent.  Effusions: None.  Lymphadenopathy: None.  Esophagus: Normal.  Upper abdomen: Normal.  Other: None.  Review of the MIP images confirms the above findings.  IMPRESSION: 1. Negative for pulmonary embolus or acute vascular abnormality. 2. Atherosclerosis and coronary artery disease. 3.  Unchanged ascending aortic ectasia measuring 38 mm x 40 mm. Recommend annual imaging followup by CTA or MRA. This recommendation follows 2010 ACCF/AHA/AATS/ACR/ASA/SCA/SCAI/SIR/STS/SVM Guidelines for the Diagnosis and Management of Patients With Thoracic Aortic Disease. Circulation. 2010; 121: R518-A416 4. Old granulomatous disease.   Electronically Signed   By: Dereck Ligas M.D.   On: 01/16/2014 11:49   Dg Chest Port 1 View  01/16/2014   CLINICAL DATA:  Chest heaviness for 1 day  EXAM: PORTABLE CHEST - 1 VIEW  COMPARISON:  Chest radiograph December 18, 2012 and chest CT October 01, 2013  FINDINGS: There is a calcified granuloma in the right lower lung zone. There is no edema or consolidation. The heart size and pulmonary vascularity are normal. No adenopathy. No bone lesions.  IMPRESSION: Calcified granuloma right lower lung zone. No edema or consolidation.   Electronically Signed   By: Lowella Grip M.D.   On: 01/16/2014 10:47    ECG Atrial fibrillation with rapid ventricular response ST & T wave abnormality, consider inferolateral ischemia Abnormal ECG.  Reviewed  personally by me  2-D echocardiogram on 01/16/14: - Left ventricle: Systolic function was mildly to moderately reduced. The estimated ejection fraction was in the range of 40% to 45%. Regional wall motion abnormalities cannot be excluded. The study is not technically sufficient to allow evaluation of LV diastolic function.  ASSESSMENT AND PLAN  1.  Atrial fibrillation with rapid ventricular response.  Duration is unknown since the patient herself is not really aware of her heart rate.  Fortunately she has been on adequate anticoagulation with Xarelto since September 2.  Mild left ventricular systolic dysfunction since January 2015, possibly tachycardia mediated cardiomyopathy. 3.  Past history of postoperative pulmonary embolus following knee surgery 4.  Syncope of uncertain etiology.  Possibly related to tachybradycardia syndrome if patient is going in and out of atrial fibrillation with postconversion pauses.  Plan: Continue Xarelto for anticoagulation.  Begin rate control with low dose IV Cardizem.   Signed, Darlin Coco, MD  01/17/2014, 9:46 AM

## 2014-01-17 NOTE — Progress Notes (Signed)
Patient heart rate converted to AFIB and jumped into the 160's to 170's at 0900 am. MD notified. Cardiziemdrip ordered. Started at [place] at 85ml/hr. Titrated X 3 ro 12.5 per Dr. Carles Collet and Baxter Flattery (Charge Nurse). Dr. Tonia Brooms (Cardiologist) also notified. Will continue to monitor.

## 2014-01-17 NOTE — Evaluation (Signed)
Physical Therapy Evaluation Patient Details Name: Whitney Medina MRN: [id number] DOB: 02/22/1932 Today's Date: 01/17/2014   History of Present Illness  80 yo female admitted with syncope, tachycardia.   Clinical Impression  Limited eval due to elevated HR. Able to observe/assist pt with short distance ambulation to and from bathroom. HR in upper 140s with minimal activity. Deferred any further activity. Do not anticipate pt will have any follow up PT needs at discharge. Will attempt to see once more once HR better controlled.     Follow Up Recommendations No PT follow up;Supervision - Intermittent (initially)    Equipment Recommendations  None recommended by PT    Recommendations for Other Services       Precautions / Restrictions Precautions Precautions: Fall Restrictions Weight Bearing Restrictions: No      Mobility  Bed Mobility Overal bed mobility: Modified Independent                Transfers Overall transfer level: Modified independent                  Ambulation/Gait Ambulation/Gait assistance: Supervision Ambulation Distance (Feet): 15 Feet (x2) Assistive device:  (IV pole) Gait Pattern/deviations: Step-through pattern     General Gait Details: HR still elevated. Pt requested assistance to bathroom. HR 136 at rest-up to high 140s with minimal activity. Deferred hallway ambulation. RN okay with trip to restroom.  Stairs            Wheelchair Mobility    Modified Rankin (Stroke Patients Only)       Balance                                             Pertinent Vitals/Pain Pain Assessment: No/denies pain    Home Living Family/patient expects to be discharged to:: Private residence Living Arrangements: Children (son)   Type of Home: House Home Access: Stairs to enter   Technical brewer of Steps: 3-4 Home Layout: Two level;Able to live on main level with bedroom/bathroom Home Equipment: Kasandra Knudsen -  single point      Prior Function Level of Independence: Independent               Hand Dominance        Extremity/Trunk Assessment   Upper Extremity Assessment: Overall WFL for tasks assessed           Lower Extremity Assessment: Overall WFL for tasks assessed      Cervical / Trunk Assessment: Normal  Communication   Communication: No difficulties  Cognition Arousal/Alertness: Awake/alert Behavior During Therapy: WFL for tasks assessed/performed Overall Cognitive Status: Within Functional Limits for tasks assessed                      General Comments      Exercises        Assessment/Plan    PT Assessment Patient needs continued PT services  PT Diagnosis Difficulty walking   PT Problem List Decreased mobility  PT Treatment Interventions Gait training   PT Goals (Current goals can be found in the Care Plan section) Acute Rehab PT Goals Patient Stated Goal: home tomorrow PT Goal Formulation: With patient Time For Goal Achievement: 01/24/14 Potential to Achieve Goals: Good    Frequency Min 3X/week   Barriers to discharge        Co-evaluation  End of Session   Activity Tolerance: Patient tolerated treatment well (HR increases with minimal activity) Patient left: in bed;with call bell/phone within reach           Time: 7867-5449 PT Time Calculation (min) (ACUTE ONLY): 8 min   Charges:   PT Evaluation $Initial PT Evaluation Tier I: 1 Procedure     PT G Codes:        Weston Anna, MPT Pager: [phone]

## 2014-01-17 NOTE — Progress Notes (Signed)
INITIAL NUTRITION ASSESSMENT  DOCUMENTATION CODES Per approved criteria  -Not Applicable   INTERVENTION: Ensure Complete po BID, each supplement provides 350 kcal and 13 grams of protein - strawberry flavor  Reviewed protein sources.   NUTRITION DIAGNOSIS: Unintentional weight loss related to decreased appetite after surgery as evidenced by 11% weight loss x 1 year.   Goal: Pt to meet >/= 90% of their estimated nutrition needs   Monitor:  PO intake, supplement acceptance, weight trends  Reason for Assessment: Pt identified as at nutrition risk on the Malnutrition Screen Tool  80 y.o. female  Admitting Dx: Syncope  ASSESSMENT: Pt admitted with syncope and found to be in afib.   Pt reports weight loss after orthopedic surgery 1 year ago. Her appetite has been decreased but has really tried to consume adequate nutrition. Pt was drinking protein drinks PTA. Pt was also exercising at the [facility]. Nutrition-focused physical exam WDL.   Height: Ht Readings from Last 1 Encounters:  01/16/14 5\' 10"  (1.778 m)    Weight: Wt Readings from Last 1 Encounters:  01/17/14 147 lb 3.2 oz (66.769 kg)    Ideal Body Weight: 68.1 kg   % Ideal Body Weight: 98%  Wt Readings from Last 10 Encounters:  01/17/14 147 lb 3.2 oz (66.769 kg)  07/24/13 142 lb 9.6 oz (64.683 kg)  05/09/13 140 lb (63.504 kg)  05/01/13 144 lb 9.6 oz (65.59 kg)  03/20/13 155 lb 9.6 oz (70.58 kg)  02/17/13 155 lb 10.3 oz (70.6 kg)  12/26/12 166 lb (75.297 kg)  12/18/12 166 lb 7.2 oz (75.5 kg)  12/12/12 166 lb (75.297 kg)    Usual Body Weight: 166 lb   % Usual Body Weight: 89%  BMI:  Body mass index is 21.12 kg/(m^2).  Estimated Nutritional Needs: Kcal: 1600-1800 Protein: 75-90 grams Fluid: > 1.6 L/day  Skin: abrasion  Diet Order: Diet regular  EDUCATION NEEDS: -No education needs identified at this time   Intake/Output Summary (Last 24 hours) at 01/17/14 1247 Last data filed at 01/16/14 2200  Gross per 24 hour  Intake   72.5 ml  Output    400 ml  Net -327.5 ml    Last BM: 12/31   Labs:   Recent Labs Lab 01/16/14 1008 01/17/14 0505  NA 138 139  K 4.3 4.0  CL 102 105  CO2 28 29  BUN 17 15  CREATININE 0.75 0.68  CALCIUM 9.2 8.6  GLUCOSE 107* 96    CBG (last 3)   Recent Labs  01/17/14 0717  GLUCAP 100*    Scheduled Meds: . atorvastatin  20 mg Oral QPM  . cholecalciferol  2,000 Units Oral Daily  . famotidine  20 mg Oral Daily  . levothyroxine  100 mcg Oral QAC breakfast  . loratadine  10 mg Oral Daily  . multivitamin with minerals  1 tablet Oral Daily  . rivaroxaban  20 mg Oral Q supper  . traZODone  50 mg Oral QHS  . vitamin C  500 mg Oral Daily    Continuous Infusions: . sodium chloride 10 mL/hr at 01/16/14 1445  . diltiazem (CARDIZEM) infusion 10 mg/hr (01/17/14 1240)    Past Medical History  Diagnosis Date  . Hypothyroidism   . Hypercholesteremia   . Left knee DJD   . PONV (postoperative nausea and vomiting)   . History of anemia   . DVT (deep venous thrombosis)   . PE (pulmonary embolism)     Past Surgical History  Procedure Laterality  Date  . Abdominal hysterectomy  1973  . Tonsillectomy  1961  . Shoulder adhesion release Left 2000  . Knee arthroscopy Left   . Total knee arthroplasty Left 12/25/2012    Procedure: TOTAL KNEE ARTHROPLASTY- left;  Surgeon: Lorn Junes, MD;  Location: [facility];  Service: Orthopedics;  Laterality: Left;    Maylon Peppers RD, [place], [place] Pager [phone] After Hours Pager

## 2014-01-17 NOTE — Progress Notes (Signed)
HR sustaining in the 150's. Converted to AFIB. Paged MD. Will continue to monitor.

## 2014-01-17 NOTE — Progress Notes (Signed)
PROGRESS NOTE  Whitney Medina:[id number] DOB: 02-Oct-1932 DOA: 01/16/2014 PCP: Tivis Ringer, MD  Brief history 80 year old female with a history of left lower extremity DVT, pulmonary embolus, hypothyroidism, hyperlipidemia presented after a syncopal episode. The patient was walking out of her bathroom, and the next thing she remembered was being on the hardwood floor. The patient went back to bed and subsequently called her primary care provider in the morning. She was told to come to the emergency department. She was brought by her son. Workup revealed atrial fibrillation with RVR in the emergency department although the patient had some bradycardia in the emergency department on telemetry. The patient spontaneously converted to sinus rhythm. However in the morning of 01/17/2014, the patient converted back to atrial fibrillation with RVR. The patient was started on diltiazem drip and cardiology was consulted. Assessment/Plan: Syncope -Suspect paroxysmal atrial fibrillation is the etiology with concerns for possible tachybradycardia syndrome as the patient was bradycardic into the 40s in the emergency department -check orthostatics -Remain on telemetry -I have consulted cardiology Paroxysmal atrial fibrillation with RVR -Patient went back into atrial fibrillation with RVR this morning after being in sinus rhythm through the night  -01/16/2014 echocardiogram EF 40-45 percent  -Patient is ready on rivaroxaban for pulmonary embolus  -TSH 0.892  -Start diltiazem drip and titrate Atypical chest pain  -Troponins negative 3  -EKG with atrial fibrillation, nonspecific T-wave changes  -CT angiogram chest negative for acute pulmonary embolus  History of pulmonary embolus/DVT leg  -Developed approximately 7 weeks after left total knee arthroplasty  -Started on rivaroxaban initially January 2015 through August 2015  -Patient was off rivaroxaban for approximately 2 months when  repeat CT angiogram of the chest suggested residual clot in the right lower lobe  -Patient restarted on rivaroxaban in October 2015  Hypothyroidism  -TSH 0.892  -Continue Synthroid  Dysuria/pyuria  -Urinalysis shows pyuria with symptomatic dysuria  -Start empiric ceftriaxone pending culture data  Hyperlipidemia  -Continue statin  Hypoxemia  -Etiology unclear  -Patient was hypoxemic in the emergency department, but currently 96-97 percent on room air without any distress  -Negative and due to atrial fibrillation with RVR versus hypoventilation     Family Communication:   Pt at beside Disposition Plan:   Home when medically stable     Procedures/Studies: Ct Angio Chest Pe W/cm &/or Wo Cm  01/16/2014   CLINICAL DATA:  Central chest pain and heaviness. Onset of symptoms this morning. Syncopal episode. Patient found down. Pulmonary embolism. Initial encounter.  EXAM: CT ANGIOGRAPHY CHEST WITH CONTRAST  TECHNIQUE: Multidetector CT imaging of the chest was performed using the standard protocol during bolus administration of intravenous contrast. Multiplanar CT image reconstructions and MIPs were obtained to evaluate the vascular anatomy.  CONTRAST:  160mL OMNIPAQUE IOHEXOL 350 MG/ML SOLN  COMPARISON:  Chest radiograph today. Chest CT 10/01/2013. Chest CT 02/14/2013.  FINDINGS: Bones: No aggressive osseous lesions. Age expected thoracic degenerative disc disease.  Cardiovascular: Coronary artery atherosclerosis is present. If office based assessment of coronary risk factors has not been performed, it is now recommended. There is no pulmonary embolism identified. No acute aortic abnormality. Aortic arch atherosclerosis.  Ascending aorta measures 38 mm x 40 mm, similar to prior.  Lungs: Dependent atelectasis. Calcified granuloma in the RIGHT lower lobe. No airspace disease. Mild lingular scarring.  Central airways: Patent.  Effusions: None.  Lymphadenopathy: None.  Esophagus: Normal.  Upper abdomen:  Normal.  Other: None.  Review of the MIP images confirms the above findings.  IMPRESSION: 1. Negative for pulmonary embolus or acute vascular abnormality. 2. Atherosclerosis and coronary artery disease. 3. Unchanged ascending aortic ectasia measuring 38 mm x 40 mm. Recommend annual imaging followup by CTA or MRA. This recommendation follows 2010 ACCF/AHA/AATS/ACR/ASA/SCA/SCAI/SIR/STS/SVM Guidelines for the Diagnosis and Management of Patients With Thoracic Aortic Disease. Circulation. 2010; 121: W098-J191 4. Old granulomatous disease.   Electronically Signed   By: Dereck Ligas M.D.   On: 01/16/2014 11:49   Dg Chest Port 1 View  01/16/2014   CLINICAL DATA:  Chest heaviness for 1 day  EXAM: PORTABLE CHEST - 1 VIEW  COMPARISON:  Chest radiograph December 18, 2012 and chest CT October 01, 2013  FINDINGS: There is a calcified granuloma in the right lower lung zone. There is no edema or consolidation. The heart size and pulmonary vascularity are normal. No adenopathy. No bone lesions.  IMPRESSION: Calcified granuloma right lower lung zone. No edema or consolidation.   Electronically Signed   By: Lowella Grip M.D.   On: 01/16/2014 10:47         Subjective: Patient has some chest discomfort. She denies any fevers, chills, coughing, hemoptysis, nausea, vomiting, diarrhea, abdominal pain, headache, dizziness.   Objective: Filed Vitals:   01/16/14 1330 01/16/14 1345 01/16/14 2154 01/17/14 0534  BP: 138/71  112/60 118/69  Pulse:  42 81 87  Temp:   99.4 F (37.4 C) 99.3 F (37.4 C)  TempSrc:   Oral Oral  Resp: 14 22 20 20   Height:  5\' 10"  (1.778 m)    Weight:  63.504 kg (140 lb)  66.769 kg (147 lb 3.2 oz)  SpO2:  81% 94% 93%    Intake/Output Summary (Last 24 hours) at 01/17/14 0827 Last data filed at 01/16/14 2200  Gross per 24 hour  Intake   72.5 ml  Output    400 ml  Net -327.5 ml   Weight change:  Exam:   General:  Pt is alert, follows commands appropriately, not in acute  distress  HEENT: No icterus, No thrush, No neck mass, [place]/AT  Cardiovascular: IRRR, S1/S2, no rubs, no gallops  Respiratory: CTA bilaterally, no wheezing, no crackles, no rhonchi  Abdomen: Soft/+BS, non tender, non distended, no guarding  Extremities: 1+LE edema, No lymphangitis, No petechiae, No rashes, no synovitis  Data Reviewed: Basic Metabolic Panel:  Recent Labs Lab 01/16/14 1008 01/17/14 0505  NA 138 139  K 4.3 4.0  CL 102 105  CO2 28 29  GLUCOSE 107* 96  BUN 17 15  CREATININE 0.75 0.68  CALCIUM 9.2 8.6   Liver Function Tests:  Recent Labs Lab 01/17/14 0505  AST 19  ALT 16  ALKPHOS 54  BILITOT 0.8  PROT 6.1  ALBUMIN 3.5   No results for input(s): LIPASE, AMYLASE in the last 168 hours. No results for input(s): AMMONIA in the last 168 hours. CBC:  Recent Labs Lab 01/16/14 1008 01/17/14 0505  WBC 5.7 4.9  HGB 13.4 12.2  HCT 41.7 37.7  MCV 94.3 94.5  PLT 161 147*   Cardiac Enzymes:  Recent Labs Lab 01/16/14 1710 01/16/14 2243 01/17/14 0505  TROPONINI <0.03 <0.03 <0.03   BNP: Invalid input(s): POCBNP CBG:  Recent Labs Lab 01/17/14 0717  GLUCAP 100*    No results found for this or any previous visit (from the past 240 hour(s)).   Scheduled Meds: . atorvastatin  20 mg Oral QPM  . cholecalciferol  2,000 Units Oral  Daily  . famotidine  20 mg Oral Daily  . levothyroxine  100 mcg Oral QAC breakfast  . loratadine  10 mg Oral Daily  . multivitamin with minerals  1 tablet Oral Daily  . rivaroxaban  20 mg Oral Q supper  . traZODone  50 mg Oral QHS  . vitamin C  500 mg Oral Daily   Continuous Infusions: . sodium chloride 10 mL/hr at 01/16/14 1445     Tsion Inghram, DO  [facility] Pager [phone]  If 7PM-7AM, please contact night-coverage [email or web address] Password [email or web address] 01/17/2014, 8:27 AM   LOS: 1 day

## 2014-01-18 DIAGNOSIS — E780 Pure hypercholesterolemia: Secondary | ICD-10-CM

## 2014-01-18 DIAGNOSIS — B9620 Unspecified Escherichia coli [E. coli] as the cause of diseases classified elsewhere: Secondary | ICD-10-CM

## 2014-01-18 DIAGNOSIS — I480 Paroxysmal atrial fibrillation: Secondary | ICD-10-CM

## 2014-01-18 DIAGNOSIS — E039 Hypothyroidism, unspecified: Secondary | ICD-10-CM

## 2014-01-18 DIAGNOSIS — N390 Urinary tract infection, site not specified: Secondary | ICD-10-CM

## 2014-01-18 LAB — GLUCOSE, CAPILLARY: Glucose-Capillary: 94 mg/dL (ref 70–99)

## 2014-01-18 LAB — BASIC METABOLIC PANEL
ANION GAP: 7 (ref 5–15)
BUN: 14 mg/dL (ref 6–23)
CO2: 26 mmol/L (ref 19–32)
Calcium: 8.6 mg/dL (ref 8.4–10.5)
Chloride: 105 mEq/L (ref 96–112)
Creatinine, Ser: 0.78 mg/dL (ref 0.50–1.10)
GFR calc non Af Amer: 77 mL/min — ABNORMAL LOW (ref >90)
GFR, EST AFRICAN AMERICAN: 90 mL/min — AB (ref >90)
Glucose, Bld: 105 mg/dL — ABNORMAL HIGH (ref 70–99)
POTASSIUM: 4.1 mmol/L (ref 3.5–5.1)
Sodium: 138 mmol/L (ref 135–145)

## 2014-01-18 MED ORDER — METOPROLOL SUCCINATE ER 25 MG PO TB24: 25.0000 mg | ORAL_TABLET | Freq: Every day | ORAL | Status: DC

## 2014-01-18 MED ORDER — PHENAZOPYRIDINE HCL 100 MG PO TABS: 100.0000 mg | ORAL_TABLET | Freq: Three times a day (TID) | ORAL | Status: DC

## 2014-01-18 MED ORDER — CEFUROXIME AXETIL 500 MG PO TABS: 500.0000 mg | ORAL_TABLET | Freq: Two times a day (BID) | ORAL | Status: DC

## 2014-01-18 MED ORDER — ENSURE COMPLETE PO LIQD: 237.0000 mL | Freq: Two times a day (BID) | ORAL | Status: DC

## 2014-01-18 MED ORDER — METOPROLOL SUCCINATE ER 25 MG PO TB24
25.0000 mg | ORAL_TABLET | Freq: Every day | ORAL | Status: DC
  Administered 2014-01-18: 25 mg via ORAL
  Filled 2014-01-18: qty 1

## 2014-01-18 NOTE — Progress Notes (Signed)
Patient Name: Whitney Medina Date of Encounter: 01/18/2014     Principal Problem:   Syncope Active Problems:   Hypothyroidism   Hypercholesteremia   Pulmonary embolism   Atrial fibrillation   Acute respiratory failure with hypoxia   Lung granuloma   Atrial fibrillation with RVR   E-coli UTI    SUBJECTIVE  Feels well.  No dyspnea or chest pain. In NSR on cardizem drip.  CURRENT MEDS . atorvastatin  20 mg Oral QPM  . cefTRIAXone (ROCEPHIN)  IV  1 g Intravenous Q24H  . cholecalciferol  2,000 Units Oral Daily  . famotidine  20 mg Oral Daily  . feeding supplement (ENSURE COMPLETE)  237 mL Oral BID BM  . levothyroxine  100 mcg Oral QAC breakfast  . loratadine  10 mg Oral Daily  . multivitamin with minerals  1 tablet Oral Daily  . phenazopyridine  100 mg Oral TID WC  . rivaroxaban  20 mg Oral Q supper  . traZODone  50 mg Oral QHS  . vitamin C  500 mg Oral Daily    OBJECTIVE  Filed Vitals:   01/17/14 1416 01/17/14 1417 01/17/14 2031 01/18/14 0626  BP:  118/64 103/49 116/58  Pulse: 147 157 74 76  Temp:  98.1 F (36.7 C) 98.7 F (37.1 C) 98.3 F (36.8 C)  TempSrc:  Oral Oral Oral  Resp:  20 20 18   Height:      Weight:    146 lb 6.4 oz (66.407 kg)  SpO2:  97% 93% 94%    Intake/Output Summary (Last 24 hours) at 01/18/14 0837 Last data filed at 01/18/14 0700  Gross per 24 hour  Intake    680 ml  Output      0 ml  Net    680 ml   Filed Weights   01/16/14 1345 01/17/14 0534 01/18/14 0626  Weight: 140 lb (63.504 kg) 147 lb 3.2 oz (66.769 kg) 146 lb 6.4 oz (66.407 kg)    PHYSICAL EXAM  General: Pleasant, NAD. Neuro: Alert and oriented X 3. Moves all extremities spontaneously. Psych: Normal affect. HEENT:  Normal  Neck: Supple without bruits or JVD. Lungs:  Resp regular and unlabored, CTA. Heart: RRR no s3, s4, or murmurs. Abdomen: Soft, non-tender, non-distended, BS + x 4.  Extremities: No clubbing, cyanosis or edema. DP/PT/Radials 2+ and equal  bilaterally.  Accessory Clinical Findings  CBC  Recent Labs  01/16/14 1008 01/17/14 0505  WBC 5.7 4.9  HGB 13.4 12.2  HCT 41.7 37.7  MCV 94.3 94.5  PLT 161 443*   Basic Metabolic Panel  Recent Labs  01/17/14 0505 01/18/14 0420  NA 139 138  K 4.0 4.1  CL 105 105  CO2 29 26  GLUCOSE 96 105*  BUN 15 14  CREATININE 0.68 0.78  CALCIUM 8.6 8.6   Liver Function Tests  Recent Labs  01/17/14 0505  AST 19  ALT 16  ALKPHOS 54  BILITOT 0.8  PROT 6.1  ALBUMIN 3.5   No results for input(s): LIPASE, AMYLASE in the last 72 hours. Cardiac Enzymes  Recent Labs  01/16/14 1710 01/16/14 2243 01/17/14 0505  TROPONINI <0.03 <0.03 <0.03   BNP Invalid input(s): POCBNP D-Dimer No results for input(s): DDIMER in the last 72 hours. Hemoglobin A1C No results for input(s): HGBA1C in the last 72 hours. Fasting Lipid Panel No results for input(s): CHOL, HDL, LDLCALC, TRIG, CHOLHDL, LDLDIRECT in the last 72 hours. Thyroid Function Tests  Recent Labs  01/16/14 1406  TSH 0.892    TELE  NSR at 76/min  ECG    Radiology/Studies  Ct Angio Chest Pe W/cm &/or Wo Cm  01/16/2014   CLINICAL DATA:  Central chest pain and heaviness. Onset of symptoms this morning. Syncopal episode. Patient found down. Pulmonary embolism. Initial encounter.  EXAM: CT ANGIOGRAPHY CHEST WITH CONTRAST  TECHNIQUE: Multidetector CT imaging of the chest was performed using the standard protocol during bolus administration of intravenous contrast. Multiplanar CT image reconstructions and MIPs were obtained to evaluate the vascular anatomy.  CONTRAST:  173mL OMNIPAQUE IOHEXOL 350 MG/ML SOLN  COMPARISON:  Chest radiograph today. Chest CT 10/01/2013. Chest CT 02/14/2013.  FINDINGS: Bones: No aggressive osseous lesions. Age expected thoracic degenerative disc disease.  Cardiovascular: Coronary artery atherosclerosis is present. If office based assessment of coronary risk factors has not been performed, it is  now recommended. There is no pulmonary embolism identified. No acute aortic abnormality. Aortic arch atherosclerosis.  Ascending aorta measures 38 mm x 40 mm, similar to prior.  Lungs: Dependent atelectasis. Calcified granuloma in the RIGHT lower lobe. No airspace disease. Mild lingular scarring.  Central airways: Patent.  Effusions: None.  Lymphadenopathy: None.  Esophagus: Normal.  Upper abdomen: Normal.  Other: None.  Review of the MIP images confirms the above findings.  IMPRESSION: 1. Negative for pulmonary embolus or acute vascular abnormality. 2. Atherosclerosis and coronary artery disease. 3. Unchanged ascending aortic ectasia measuring 38 mm x 40 mm. Recommend annual imaging followup by CTA or MRA. This recommendation follows 2010 ACCF/AHA/AATS/ACR/ASA/SCA/SCAI/SIR/STS/SVM Guidelines for the Diagnosis and Management of Patients With Thoracic Aortic Disease. Circulation. 2010; 121: W979-Y801 4. Old granulomatous disease.   Electronically Signed   By: Dereck Ligas M.D.   On: 01/16/2014 11:49   Dg Chest Port 1 View  01/16/2014   CLINICAL DATA:  Chest heaviness for 1 day  EXAM: PORTABLE CHEST - 1 VIEW  COMPARISON:  Chest radiograph December 18, 2012 and chest CT October 01, 2013  FINDINGS: There is a calcified granuloma in the right lower lung zone. There is no edema or consolidation. The heart size and pulmonary vascularity are normal. No adenopathy. No bone lesions.  IMPRESSION: Calcified granuloma right lower lung zone. No edema or consolidation.   Electronically Signed   By: Lowella Grip M.D.   On: 01/16/2014 10:47    ASSESSMENT AND PLAN 1. Paroxysmal atrial fibrillation. Chads-vasc score is 7. 2. Mild left ventricular systolic dysfunction since January 2015, possibly tachycardia mediated cardiomyopathy. 3. Past history of postoperative pulmonary embolus following knee surgery 4. Syncope of uncertain etiology. Possibly related to tachybradycardia syndrome if patient is going in and  out of atrial fibrillation with postconversion pauses.  Plan: Continue long term anticoagulation with xarelto for PAF. Switch to oral BB to help prevent future PAF. Stop IV cardizem. Walk in hall. Could consider home later today if stable. She would benefit from an outpatient 30 day event monitor to work up syncope further and to see frequency of PAF. This could be arranged through our office next week.   Signed, Darlin Coco MD

## 2014-01-18 NOTE — Discharge Summary (Signed)
Physician Discharge Summary  VON INSCOE GBT:[id number] DOB: May 09, 1932 DOA: 01/16/2014  PCP: Tivis Ringer, MD  Admit date: 01/16/2014 Discharge date: 01/18/2014  Time spent: 65 minutes  Recommendations for Outpatient Follow-up:  1. Follow-up with Tivis Ringer, MD in 1 week. All follow-up urine sensitivities will need to be followed up upon. Patient also need a CBC and a basic metabolic profile done. 2. Patient is to follow-up with Dr. Mare Ferrari of cardiology in 2 weeks. 3. Patient will be set up with outpatient Holter monitor for further evaluation of his syncope.  Discharge Diagnoses:  Principal Problem:   Syncope Active Problems:   Hypothyroidism   Hypercholesteremia   Pulmonary embolism   Atrial fibrillation   Acute respiratory failure with hypoxia   Lung granuloma   Atrial fibrillation with RVR   E-coli UTI   Discharge Condition: Stable and improved  Diet recommendation: Heart healthy  Filed Weights   01/16/14 1345 01/17/14 0534 01/18/14 0626  Weight: 63.504 kg (140 lb) 66.769 kg (147 lb 3.2 oz) 66.407 kg (146 lb 6.4 oz)    History of present illness:  81 year old female with past medical history of pulmonary embolism on anticoagulation with xarelto, has IVC filter, dyslipidemia, hypothyroidism who presented to [facility] ED reports of syncopal episode at home. Patient reported having chest pain and shortness of breath prior to syncope. No palpitations. Patient unsure how long unconsciousness lasted. No reports of abdominal pain, nausea or vomiting. She reported having fever last nite but no other complaints such as cough. No urinary complaints.  In ED, BP was 103-61, HR 42-126, RR 12-22, T max 98.3 F and oxygen saturation 77 - 99% with [place] oxygen support. Blood work was unremarkable. CT scan did not show acute pulmonary embolism. CXR showed calcified granuloma right lower lung zone, no edema or consolidation. The 12 lead EKG showed atrial fibrillation. Herat rate  spontaneously improved to 42. She was admitted to telemetry for further evaluation  Hospital Course:  Syncope -Suspect paroxysmal atrial fibrillation is the etiology with concerns for possible tachybradycardia syndrome as the patient was bradycardic into the 40s in the emergency department. Patient was admitted orthostatics were checked which were negative. Cardiology was consulted and patient was seen in consultation. It was felt patient's syncope may be possibly related to tachybradycardia syndrome if patient was going in and out of atrial fibrillation with postconversion pauses. Patient was rate controlled. Cardiac enzymes negative 3. 2-D echo with EF of 40-45% with no wall motion abnormalities. Patient was subsequently transitioned to oral Toprol. Patient will follow-up with cardiology as outpatient. Patient on need to be set up with a Holter monitor for further evaluation of her syncope as outpatient. This will be arranged per cardiology. Paroxysmal atrial fibrillation with RVR During the hospitalization patient was noted to go in A. fib with RVR after being convert it into sinus rhythm on admission. Cardiology consultation was obtained and patient was placed on IV cardizem. Cardiac enzymes were cycled which were negative. TSH WNL. Patient was already on rivaroxaban prior to admission. 2-D echo which was obtained and EF 40-45% which was felt to be possibly tachycardia mediated cardiomyopathy. Patient converted back into normal sinus rhythm was transitioned to oral beta blocker. Patient's heart remained controlled. Patient be discharged in stable and improved condition and is to follow-up with cardiology as outpatient. Atypical chest pain  On presentation patient did have some complaints of chest pain. Patient was noted to be in A. fib with RVR and started on IV Cardizem drip. Cardiac  enzymes which was cycled were negative 3. CT and 2 g of the chest which was done was negative for PE. 2-D echo was  done which E EF of 40-45% with no wall abnormalities. It was felt patient's mild left ventricular this systolic dysfunction was possibly tachycardia mediated cardiomyopathy. Patient was seen in consultation by cardiology and was felt no further cardiac workup was needed. History of pulmonary embolus/DVT leg  -Developed approximately 7 weeks after left total knee arthroplasty  -Started on rivaroxaban initially January 2015 through August 2015  -Patient was off rivaroxaban for approximately 2 months when repeat CT angiogram of the chest suggested residual clot in the right lower lobe  -Patient restarted on rivaroxaban in October 2015. Hypothyroidism  -TSH 0.892  -Continued on home regimen of Synthroid  Escherichia coli UTI Urinalysis which was done on admission was consistent with pyuria and patient did have dysuria. Patient was started empirically on IV Rocephin. Urine cultures grew out Escherichia coli however sensitivities were pending at time of discharge. Patient remained afebrile. Patient be discharged on 5 more days of oral Ceftin to complete a one-week course of antibiotic therapy. Sensitivities will need to be followed up upon per PCP. Hyperlipidemia  -Continued on home regimen of statin  Hypoxemia  Patient was noted to be hypoxemic on admission in the emergency room however her oxygenation improved where she was satting 96-97% on room air. Chest x-ray which was done was negative for any acute infiltrate. CT angiogram of the chest which was done was negative for PE. Patient was noted to be in atrial fibrillation which may have caused the hypoxemia. Patient's rate was controlled she converted back into normal sinus rhythm and did not have any further hypoxemia. She will be discharged in stable and improved condition.     Procedures:  2 d echo 01/16/14  CXR 01/16/14  CT angio chest 01/16/14  Consultations:  Cardiology: Dr Mare Ferrari 01/17/14  Discharge Exam: Filed Vitals:    01/18/14 1409  BP: 108/60  Pulse: 80  Temp: 97.4 F (36.3 C)  Resp: 16    General: NAD Cardiovascular: RRR Respiratory: CTAB  Discharge Instructions   Discharge Instructions    Diet - low sodium heart healthy    Complete by:  As directed      Discharge instructions    Complete by:  As directed   Follow up with Tivis Ringer, MD in 1 week. Follow up with Dr Mare Ferrari cardiology in 2 weeks. Patient will be set up with holter monitor as outpatient to be arranged by cardiology next week.     Increase activity slowly    Complete by:  As directed           Current Discharge Medication List    START taking these medications   Details  cefUROXime (CEFTIN) 500 MG tablet Take 1 tablet (500 mg total) by mouth 2 (two) times daily with a meal. Take for 5 days then stop. Qty: 10 tablet, Refills: 0    feeding supplement, ENSURE COMPLETE, (ENSURE COMPLETE) LIQD Take 237 mLs by mouth 2 (two) times daily between meals.    metoprolol succinate (TOPROL-XL) 25 MG 24 hr tablet Take 1 tablet (25 mg total) by mouth daily. Qty: 30 tablet, Refills: 0    phenazopyridine (PYRIDIUM) 100 MG tablet Take 1 tablet (100 mg total) by mouth 3 (three) times daily with meals. Take for 4 days then stop. Qty: 20 tablet, Refills: 0      CONTINUE these medications which have NOT  CHANGED   Details  acetaminophen (TYLENOL) 500 MG tablet Take 1,000 mg by mouth every 6 (six) hours as needed for moderate pain or fever (fever).     atorvastatin (LIPITOR) 20 MG tablet Take 20 mg by mouth every evening.    Calcium Carb-Cholecalciferol (CALCIUM 1000 + D PO) Take 1 tablet by mouth 2 (two) times daily.     Cholecalciferol (VITAMIN D) 2000 UNITS CAPS Take 1 capsule by mouth daily.    levothyroxine (SYNTHROID, LEVOTHROID) 100 MCG tablet Take 100 mcg by mouth daily before breakfast.    loratadine (CLARITIN) 10 MG tablet Take 10 mg by mouth daily.    Multiple Vitamins-Minerals (MULTIVITAMIN WITH MINERALS)  tablet Take 1 tablet by mouth daily.    ranitidine (ZANTAC) 150 MG tablet Take 150 mg by mouth daily.    rivaroxaban (XARELTO) 20 MG TABS tablet Take 1 tablet (20 mg total) by mouth daily with supper. Qty: 30 tablet, Refills: 5    traZODone (DESYREL) 50 MG tablet Take 50 mg by mouth at bedtime.    vitamin C (ASCORBIC ACID) 500 MG tablet Take 500 mg by mouth daily.        Allergies  Allergen Reactions  . Codeine Nausea And Vomiting    Percocet OK   Follow-up Information    Follow up with Tivis Ringer, MD. Schedule an appointment as soon as possible for a visit in 1 week.   Specialty:  Internal Medicine   Contact information:   [place] [address] [phone]       Follow up with Darlin Coco, MD. Schedule an appointment as soon as possible for a visit in 2 weeks.   Specialty:  Cardiology   Contact information:   [place] [address] [place] [address] [phone]        The results of significant diagnostics from this hospitalization (including imaging, microbiology, ancillary and laboratory) are listed below for reference.    Significant Diagnostic Studies: Ct Angio Chest Pe W/cm &/or Wo Cm  01/16/2014   CLINICAL DATA:  Central chest pain and heaviness. Onset of symptoms this morning. Syncopal episode. Patient found down. Pulmonary embolism. Initial encounter.  EXAM: CT ANGIOGRAPHY CHEST WITH CONTRAST  TECHNIQUE: Multidetector CT imaging of the chest was performed using the standard protocol during bolus administration of intravenous contrast. Multiplanar CT image reconstructions and MIPs were obtained to evaluate the vascular anatomy.  CONTRAST:  150mL OMNIPAQUE IOHEXOL 350 MG/ML SOLN  COMPARISON:  Chest radiograph today. Chest CT 10/01/2013. Chest CT 02/14/2013.  FINDINGS: Bones: No aggressive osseous lesions. Age expected thoracic degenerative disc disease.  Cardiovascular: Coronary artery atherosclerosis is present. If office based  assessment of coronary risk factors has not been performed, it is now recommended. There is no pulmonary embolism identified. No acute aortic abnormality. Aortic arch atherosclerosis.  Ascending aorta measures 38 mm x 40 mm, similar to prior.  Lungs: Dependent atelectasis. Calcified granuloma in the RIGHT lower lobe. No airspace disease. Mild lingular scarring.  Central airways: Patent.  Effusions: None.  Lymphadenopathy: None.  Esophagus: Normal.  Upper abdomen: Normal.  Other: None.  Review of the MIP images confirms the above findings.  IMPRESSION: 1. Negative for pulmonary embolus or acute vascular abnormality. 2. Atherosclerosis and coronary artery disease. 3. Unchanged ascending aortic ectasia measuring 38 mm x 40 mm. Recommend annual imaging followup by CTA or MRA. This recommendation follows 2010 ACCF/AHA/AATS/ACR/ASA/SCA/SCAI/SIR/STS/SVM Guidelines for the Diagnosis and Management of Patients With Thoracic Aortic Disease. Circulation. 2010; 121:  M600-K599 4. Old granulomatous disease.   Electronically Signed   By: Dereck Ligas M.D.   On: 01/16/2014 11:49   Dg Chest Port 1 View  01/16/2014   CLINICAL DATA:  Chest heaviness for 1 day  EXAM: PORTABLE CHEST - 1 VIEW  COMPARISON:  Chest radiograph December 18, 2012 and chest CT October 01, 2013  FINDINGS: There is a calcified granuloma in the right lower lung zone. There is no edema or consolidation. The heart size and pulmonary vascularity are normal. No adenopathy. No bone lesions.  IMPRESSION: Calcified granuloma right lower lung zone. No edema or consolidation.   Electronically Signed   By: Lowella Grip M.D.   On: 01/16/2014 10:47    Microbiology: Recent Results (from the past 240 hour(s))  Urine culture     Status: None (Preliminary result)   Collection Time: 01/16/14 10:53 AM  Result Value Ref Range Status   Specimen Description URINE, CLEAN CATCH  Final   Special Requests NONE  Final   Colony Count   Final    >=100,000  COLONIES/ML Performed at [facility]    Culture   Final    ESCHERICHIA COLI Performed at [facility]    Report Status PENDING  Incomplete     Labs: Basic Metabolic Panel:  Recent Labs Lab 01/16/14 1008 01/17/14 0505 01/18/14 0420  NA 138 139 138  K 4.3 4.0 4.1  CL 102 105 105  CO2 28 29 26   GLUCOSE 107* 96 105*  BUN 17 15 14   CREATININE 0.75 0.68 0.78  CALCIUM 9.2 8.6 8.6   Liver Function Tests:  Recent Labs Lab 01/17/14 0505  AST 19  ALT 16  ALKPHOS 54  BILITOT 0.8  PROT 6.1  ALBUMIN 3.5   No results for input(s): LIPASE, AMYLASE in the last 168 hours. No results for input(s): AMMONIA in the last 168 hours. CBC:  Recent Labs Lab 01/16/14 1008 01/17/14 0505  WBC 5.7 4.9  HGB 13.4 12.2  HCT 41.7 37.7  MCV 94.3 94.5  PLT 161 147*   Cardiac Enzymes:  Recent Labs Lab 01/16/14 1710 01/16/14 2243 01/17/14 0505  TROPONINI <0.03 <0.03 <0.03   BNP: BNP (last 3 results) No results for input(s): PROBNP in the last 8760 hours. CBG:  Recent Labs Lab 01/17/14 0717 01/18/14 0736  GLUCAP 100* 94       Signed:  THOMPSON,DANIEL MD [facility] 01/18/2014, 5:35 PM

## 2014-01-18 NOTE — Progress Notes (Signed)
Physical Therapy Treatment Patient Details Name: Whitney Medina MRN: [id number] DOB: 04-Dec-1932 Today's Date: 01/18/2014    History of Present Illness 81 yo female admitted with syncope, tachycardia.     PT Comments    Pt tolerated activity well. HR 94-104 bpm during session. Pt did report some concern for L TKA since she did have a fall prior to admission. Pt denies pain. Encouraged pt to follow with ortho MD. No further PT needs. Will d/c from PT. Thanks.   Follow Up Recommendations  No PT follow up;Supervision - Intermittent     Equipment Recommendations  None recommended by PT    Recommendations for Other Services       Precautions / Restrictions Precautions Precautions: None Restrictions Weight Bearing Restrictions: No    Mobility  Bed Mobility               General bed mobility comments: pt oob in recliner  Transfers Overall transfer level: Modified independent                  Ambulation/Gait Ambulation/Gait assistance: Min guard Ambulation Distance (Feet): 500 Feet Assistive device: None Gait Pattern/deviations: WFL(Within Functional Limits)     General Gait Details: HR 94-104 bpm during ambulation. No LOB. Pt denied lightheadedness/dizziness. Tolerated activity very well.    Stairs Stairs: Yes Stairs assistance: Min guard Stair Management: Forwards;Alternating pattern;One rail Left Number of Stairs: 3    Wheelchair Mobility    Modified Rankin (Stroke Patients Only)       Balance Overall balance assessment: Needs assistance   Sitting balance-Leahy Scale: Normal       Standing balance-Leahy Scale: Good Standing balance comment: Had pt perform static standing with EO/EC, narrow BOS, and withstanding external perturbations; picking up of object; 360 degree turn-no LOB or difficulty noted.                     Cognition Arousal/Alertness: Awake/alert Behavior During Therapy: WFL for tasks assessed/performed Overall  Cognitive Status: Within Functional Limits for tasks assessed                      Exercises      General Comments        Pertinent Vitals/Pain Pain Assessment: No/denies pain    Home Living                      Prior Function            PT Goals (current goals can now be found in the care plan section) Progress towards PT goals: Goals met/education completed, patient discharged from PT    Frequency       PT Plan Current plan remains appropriate    Co-evaluation             End of Session Equipment Utilized During Treatment: Gait belt Activity Tolerance: Patient tolerated treatment well Patient left: in chair;with call bell/phone within reach     Time: 3794-3276 PT Time Calculation (min) (ACUTE ONLY): 10 min  Charges:  $Gait Training: 8-22 mins                    G Codes:      Weston Anna, MPT Pager: [phone]

## 2014-01-18 NOTE — Progress Notes (Signed)
Patient discharged home with daughter, discharge instructions given and explained to patient and she verbalized understanding, denies any pain/distress. No wound noted, skin intact. accompanies home by daughter, transported to the car by staff.

## 2014-01-20 LAB — URINE CULTURE: Colony Count: >=100000

## 2014-02-06 ENCOUNTER — Encounter: Payer: Self-pay | Admitting: Physician Assistant

## 2014-02-06 ENCOUNTER — Ambulatory Visit (INDEPENDENT_AMBULATORY_CARE_PROVIDER_SITE_OTHER): Payer: Commercial Managed Care - HMO | Admitting: Physician Assistant

## 2014-02-06 VITALS — BP 100/60 | HR 68 | Ht 70.0 in | Wt 149.0 lb

## 2014-02-06 DIAGNOSIS — I429 Cardiomyopathy, unspecified: Secondary | ICD-10-CM | POA: Diagnosis not present

## 2014-02-06 DIAGNOSIS — R55 Syncope and collapse: Secondary | ICD-10-CM | POA: Diagnosis not present

## 2014-02-06 DIAGNOSIS — E785 Hyperlipidemia, unspecified: Secondary | ICD-10-CM | POA: Diagnosis not present

## 2014-02-06 DIAGNOSIS — I480 Paroxysmal atrial fibrillation: Secondary | ICD-10-CM | POA: Diagnosis not present

## 2014-02-06 DIAGNOSIS — I2699 Other pulmonary embolism without acute cor pulmonale: Secondary | ICD-10-CM | POA: Diagnosis not present

## 2014-02-06 DIAGNOSIS — I2510 Atherosclerotic heart disease of native coronary artery without angina pectoris: Secondary | ICD-10-CM

## 2014-02-06 DIAGNOSIS — I712 Thoracic aortic aneurysm, without rupture: Secondary | ICD-10-CM | POA: Diagnosis not present

## 2014-02-06 DIAGNOSIS — I7121 Aneurysm of the ascending aorta, without rupture: Secondary | ICD-10-CM

## 2014-02-06 NOTE — Patient Instructions (Addendum)
Your physician has requested that you have a lexiscan myoview. DX CARDIOMYOPATHY, CORONARY CALCIUM ON CT. For further information please visit [email or web address]. Please follow instruction sheet, as given.  Your physician has recommended that you wear an event monitor FOR 30 DAYS PER [facility], PAC. Event monitors are medical devices that record the heart's electrical activity. Doctors most often [place] these monitors to diagnose arrhythmias. Arrhythmias are problems with the speed or rhythm of the heartbeat. The monitor is a small, portable device. You can wear one while you do your normal daily activities. This is usually used to diagnose what is causing palpitations/syncope (passing out).  Your physician recommends that you schedule a follow-up appointment in: [place], PAC NO DRIVING FOR 6 MONTHS FROM THE TIME YOU PASSED OUT

## 2014-02-06 NOTE — Progress Notes (Addendum)
Cardiology Office Note   Date:  02/06/2014   ID:  [name], DOB 08-17-1932, MRN [id number]  PCP:  Tivis Ringer, MD  Cardiologist:  Dr. Darlin Coco    Chief Complaint  Patient presents with  . Loss of Consciousness    POST HOSPITAL    . Atrial Fibrillation  . Cardiomyopathy     History of Present Illness: Whitney Medina is a 81 y.o. female who presents for FU of the above.    She has a hx of post operative (knee surgery) pulmonary embolism treated with Xarelto and IVC filter (01/2013) and IVC filter was subsequently removed.  She completed 6 mos of AC therapy with Xarelto.  However, her Xarelto was resumed in 09/2013 a CT scan suggested residual clot in the right lower lobe.  Other history includes hyperlipidemia, hypothyroidism.  She was admitted last month with a syncopal episode.  She was found to be in AFib with RVR.  EF was noted to be down at 40-45% (possibly related to tachycardia).  It was questioned whether or not her syncope may be related to post-termination pauses.  She was placed on beta blocker.  Long term anticoagulation with Xarelto was recommended.  Cardiac enzymes remained normal. Dr. Darlin Coco followed her in the hospital and recommended an OP event monitor.  Patient was also treated with antibiotics for a UTI. Chest CT was negative for pulmonary embolism.  Of note, her CT did show coronary atherosclerosis and stable ascending aorta dilatation.  She has been doing well since DC.  She denies further syncope. She does note a long history of near syncope. She describes this as a very brief episode of an unusual feeling in her chest and lightheadedness. This would typically resolve in several seconds. She denies exertional chest pain. She somewhat limited after her knee surgery last year. She denies significant dyspnea. She denies orthopnea, PND or edema.   Studies Reviewed Today:   - Echocardiogram 12/3013:  Left ventricle: Systolic function  was mildly to moderately reduced. The estimated ejection fraction was in the range of 40% to 45%. Regional wall motion abnormalities cannot be excluded.  The study is not technically sufficient to allow evaluation of LV diastolic function.  - Chest CTA 01/16/14:  IMPRESSION: 1. Negative for pulmonary embolus or acute vascular abnormality. 2. Atherosclerosis and coronary artery disease. 3. Unchanged ascending aortic ectasia measuring 38 mm x 40 mm.  Recommend annual imaging followup by CTA or MRA. This recommendation follows 2010 ACCF/AHA/AATS/ACR/ASA/SCA/SCAI/SIR/STS/SVM Guidelines for the Diagnosis and Management of Patients With Thoracic Aortic Disease. Circulation. 2010; 121: I458-K998  4. Old granulomatous disease   Past Medical History  Diagnosis Date  . Hypothyroidism   . Hypercholesteremia   . Left knee DJD   . History of anemia   . DVT (deep venous thrombosis)   . PE (pulmonary embolism)   . Cardiomyopathy     a.  Echocardiogram 12/3013:  Left ventricle: Systolic function was mildly to moderately reduced. The estimated ejection fraction was in the range of 40% to 45%. Regional wall motion abnormalities cannot be excluded.  The study is not technically sufficient to allow evaluation of LV diastolic function.  Marland Kitchen PAF (paroxysmal atrial fibrillation)   . CAD (coronary artery disease)     a. Chest CTA 01/16/14:  IMPRESSION: 1. Negative for pulmonary embolus or acute vascular abnormality. 2. Atherosclerosis and coronary artery disease. 3. Unchanged ascending aortic ectasia measuring 38 mm x 40 mm.  Recommend annual imaging followup by  CTA or MRA  . Ascending aortic aneurysm     3.8 x 4 cm on CTA 12/2013    Past Surgical History  Procedure Laterality Date  . Abdominal hysterectomy  1973  . Tonsillectomy  1961  . Shoulder adhesion release Left 2000  . Knee arthroscopy Left   . Total knee arthroplasty Left 12/25/2012    Procedure: TOTAL KNEE ARTHROPLASTY- left;  Surgeon: Lorn Junes,  MD;  Location: [facility];  Service: Orthopedics;  Laterality: Left;     Current Outpatient Prescriptions  Medication Sig Dispense Refill  . acetaminophen (TYLENOL) 500 MG tablet Take 1,000 mg by mouth every 6 (six) hours as needed for moderate pain or fever (fever).     Marland Kitchen atorvastatin (LIPITOR) 20 MG tablet Take 20 mg by mouth every evening.    . Calcium Carb-Cholecalciferol (CALCIUM 1000 + D PO) Take 1 tablet by mouth 2 (two) times daily.     . cefUROXime (CEFTIN) 500 MG tablet Take 1 tablet (500 mg total) by mouth 2 (two) times daily with a meal. Take for 5 days then stop. 10 tablet 0  . Cholecalciferol (VITAMIN D) 2000 UNITS CAPS Take 1 capsule by mouth daily.    . feeding supplement, ENSURE COMPLETE, (ENSURE COMPLETE) LIQD Take 237 mLs by mouth 2 (two) times daily between meals.    Marland Kitchen levothyroxine (SYNTHROID, LEVOTHROID) 100 MCG tablet Take 100 mcg by mouth daily before breakfast.    . loratadine (CLARITIN) 10 MG tablet Take 10 mg by mouth daily.    . metoprolol succinate (TOPROL-XL) 25 MG 24 hr tablet Take 1 tablet (25 mg total) by mouth daily. 30 tablet 0  . Multiple Vitamins-Minerals (MULTIVITAMIN WITH MINERALS) tablet Take 1 tablet by mouth daily.    . phenazopyridine (PYRIDIUM) 100 MG tablet Take 1 tablet (100 mg total) by mouth 3 (three) times daily with meals. Take for 4 days then stop. 20 tablet 0  . ranitidine (ZANTAC) 150 MG tablet Take 150 mg by mouth daily.    . rivaroxaban (XARELTO) 20 MG TABS tablet Take 1 tablet (20 mg total) by mouth daily with supper. 30 tablet 5  . traZODone (DESYREL) 50 MG tablet Take 50 mg by mouth at bedtime.    . vitamin C (ASCORBIC ACID) 500 MG tablet Take 500 mg by mouth daily.      No current facility-administered medications for this visit.    Allergies:   Codeine    Social History:  The patient  reports that she has never smoked. She has never used smokeless tobacco. She reports that she does not drink alcohol or use illicit drugs.   Family  History:  The patient's family history includes Cancer - Other in her mother; Heart disease in her sister; Melanoma in her sister.    ROS:  Please see the history of present illness.   Otherwise, review of systems are positive for occasional epistaxis.   All other systems are reviewed and negative.    PHYSICAL EXAM: VS:  BP 100/60 mmHg  Pulse 68  Ht 5\' 10"  (1.778 m)  Wt 149 lb (67.586 kg)  BMI 21.38 kg/m2 , GEN: Well nourished, well developed, in no acute distress HEENT: normal Neck: no JVD, no carotid bruits, or masses Cardiac:  RRR; no murmurs, rubs, or gallops,no edema  Respiratory:  clear to auscultation bilaterally, no wheezing, rhonchi or rales. GI: soft, nontender, nondistended, + BS MS: no deformity or atrophy Skin: warm and dry  Neuro:  Strength and sensation  are intact Psych: Normal affect  Wt Readings from Last 3 Encounters:  01/18/14 146 lb 6.4 oz (66.407 kg)  07/24/13 142 lb 9.6 oz (64.683 kg)  05/01/13 144 lb 9.6 oz (65.59 kg)      EKG:  EKG is ordered today.  It demonstrates:   NSR, HR 68, normal axis, nonspecific ST-T wave changes   Recent Labs: 01/16/2014: B Natriuretic Peptide 147.5*; TSH 0.892 01/17/2014: ALT 16; Hemoglobin 12.2; Platelets 147* 01/18/2014: BUN 14; Creatinine 0.78; Potassium 4.1; Sodium 138    Lipid Panel No results found for: CHOL, TRIG, HDL, CHOLHDL, VLDL, LDLCALC, LDLDIRECT    ASSESSMENT AND PLAN:  1.  Paroxysmal Atrial Fibrillation: She is maintaining normal sinus rhythm. She requirs long-term anticoagulation. She will remain on Xarelto.  She is tolerating low dose Toprol. 2.  Syncope: Unexplained. We reviewed the recommendation that she should not drive for 6 months after her last syncopal episode.  There was some concern for post-termination pauses in the hospital. She was to get an outpatient event monitor. This is not yet been done.    -  Arrange 30 day event monitor.    -  No driving 6 months. 3.  Coronary Artery  Calcification on CT Scan:  Coronary atherosclerosis was noted on her recent chest CTA. She has a significant family history of CAD. She also notes unusual chest sensations at times. Her ejection fraction is depressed. This is likely related to tachycardia. However, I have recommended proceeding with stress testing to rule out the possibility of ischemia contributing to her cardiomyopathy.    -  Arrange Lexiscan Myoview. 4.  Ascending Aortic Aneurysm:  Stable measurements by recent chest CTA. She will likely require follow-up echocardiogram or CT angiogram in 1 year. 5.  Hx of Pulmonary Embolism: She remains on Xarelto. Recent CTA with no evidence of pulmonary embolism. 6.  Cardiomyopathy: Likely related to tachycardia. Obtain Myoview as noted to rule out ischemic heart disease. Continue beta blocker. Blood pressure would not likely tolerate the addition of an ACE inhibitor at this time. She is NYHA 2. No signs of volume excess. 7.  Hyperlipidemia: Continue statin.   Current medicines are reviewed at length with the patient today.  The patient does not have concerns regarding medicines.  The following changes have been made:  no change  Labs/ tests ordered today include:  Orders Placed This Encounter  Procedures  . Myocardial Perfusion Imaging  . EKG 12-Lead  . Cardiac event monitor    Disposition:   No driving x 6 mos.  FU with Dr. Darlin Coco  in 6 months   Signed, Versie Starks, MHS 02/06/2014 11:24 AM    [facility], [place], [place]  [address] Phone: [phone]; Fax: [phone]

## 2014-02-11 ENCOUNTER — Encounter (INDEPENDENT_AMBULATORY_CARE_PROVIDER_SITE_OTHER): Payer: Commercial Managed Care - HMO

## 2014-02-11 ENCOUNTER — Encounter: Payer: Self-pay | Admitting: *Deleted

## 2014-02-11 ENCOUNTER — Encounter (HOSPITAL_COMMUNITY): Payer: Commercial Managed Care - HMO

## 2014-02-11 ENCOUNTER — Ambulatory Visit (HOSPITAL_COMMUNITY): Payer: Commercial Managed Care - HMO | Attending: Physician Assistant | Admitting: Radiology

## 2014-02-11 DIAGNOSIS — R55 Syncope and collapse: Secondary | ICD-10-CM

## 2014-02-11 DIAGNOSIS — I2510 Atherosclerotic heart disease of native coronary artery without angina pectoris: Secondary | ICD-10-CM

## 2014-02-11 DIAGNOSIS — I4891 Unspecified atrial fibrillation: Secondary | ICD-10-CM | POA: Insufficient documentation

## 2014-02-11 DIAGNOSIS — E785 Hyperlipidemia, unspecified: Secondary | ICD-10-CM | POA: Insufficient documentation

## 2014-02-11 MED ORDER — TECHNETIUM TC 99M SESTAMIBI GENERIC - CARDIOLITE
10.0000 | Freq: Once | INTRAVENOUS | Status: AC | PRN
  Administered 2014-02-11: 10 via INTRAVENOUS

## 2014-02-11 MED ORDER — REGADENOSON 0.4 MG/5ML IV SOLN
0.4000 mg | Freq: Once | INTRAVENOUS | Status: AC
  Administered 2014-02-11: 0.4 mg via INTRAVENOUS

## 2014-02-11 MED ORDER — TECHNETIUM TC 99M SESTAMIBI GENERIC - CARDIOLITE
30.0000 | Freq: Once | INTRAVENOUS | Status: AC | PRN
  Administered 2014-02-11: 30 via INTRAVENOUS

## 2014-02-11 NOTE — Progress Notes (Signed)
  [facility] [address] [place],  [address] [phone]    Cardiology Nuclear Med Study  Whitney Medina is a 81 y.o. female     MRN : [id number]     DOB: 1933-01-14  Procedure Date: 02/11/2014  Nuclear Med Background Indication for Stress Test:  Evaluation for Ischemia History:  No known CAD Cardiac Risk Factors: Lipids and Atrial Fib  Symptoms:  Syncope   Nuclear Pre-Procedure Caffeine/Decaff Intake:  None NPO After: 7:00pm   Lungs:  clear O2 Sat: 97% on room air. IV 0.9% NS with Angio Cath:  22g  IV Site: R Hand  IV Started by:  Matilde Haymaker, RN  Chest Size (in):  36 Cup Size: C  Height: 5\' 9"  (1.753 m)  Weight:  149 lb (67.586 kg)  BMI:  Body mass index is 21.99 kg/(m^2). Tech Comments:  No Toprol x 24 hrs    Nuclear Med Study 1 or 2 day study: 1 day  Stress Test Type:  Lexiscan  Reading MD: n/a  Order Authorizing Provider:  Henrene Dodge and [facility]  Resting Radionuclide: Technetium 22m Sestamibi  Resting Radionuclide Dose: 11.0 mCi   Stress Radionuclide:  Technetium 34m Sestamibi  Stress Radionuclide Dose: 33.0 mCi           Stress Protocol Rest HR: 67 Stress HR: 88  Rest BP: 144/78 Stress BP: 136/62  Exercise Time (min): n/a METS: n/a   Predicted Max HR: 141 bpm % Max HR: 62.41 bpm Rate Pressure Product: 13464   Dose of Adenosine (mg):  n/a Dose of Lexiscan: 0.4 mg  Dose of Atropine (mg): n/a Dose of Dobutamine: n/a mcg/kg/min (at max HR)  Stress Test Technologist: Glade Lloyd, BS-ES  Nuclear Technologist:  Earl Many, CNMT     Rest Procedure:  Myocardial perfusion imaging was performed at rest 45 minutes following the intravenous administration of Technetium 36m Sestamibi. Rest ECG: Normal sinus rhythm. Normal EKG.  Stress Procedure:  The patient received IV Lexiscan 0.4 mg over 15-seconds.  Technetium 79m Sestamibi injected at 30-seconds.  Quantitative spect images were obtained  after a 45 minute delay.  During the infusion of Lexiscan the patient complained of SOB but resolved in recovery.  Stress ECG: No significant change from baseline ECG  QPS Raw Data Images:  Normal; no motion artifact; normal heart/lung ratio. Stress Images:  Normal homogeneous uptake in all areas of the myocardium. Rest Images:  Normal homogeneous uptake in all areas of the myocardium. Subtraction (SDS):  No evidence of ischemia. Transient Ischemic Dilatation (Normal <1.22):  0.98 Lung/Heart Ratio (Normal <0.45):  0.35  Quantitative Gated Spect Images QGS EDV:  63 ml QGS ESV:  19 ml  Impression Exercise Capacity:  Lexiscan with no exercise. BP Response:  Normal blood pressure response. Clinical Symptoms:  Shortness of breath ECG Impression:  No significant ST segment change suggestive of ischemia. Comparison with Prior Nuclear Study: No previous nuclear study performed  Overall Impression:  Normal stress nuclear study.  This is a low risk scan. There is no scar or ischemia. LV Ejection Fraction: 70%.  LV Wall Motion:  Normal Wall Motion.  Dola Argyle, MD

## 2014-02-11 NOTE — Progress Notes (Signed)
Patient ID: Whitney Medina, female   DOB: 14-Mar-1932, 81 y.o.   MRN: [id number] [employer] 30 day cardiac event monitor applied to patient.

## 2014-02-12 ENCOUNTER — Encounter: Payer: Self-pay | Admitting: Physician Assistant

## 2014-02-12 ENCOUNTER — Telehealth: Payer: Self-pay | Admitting: *Deleted

## 2014-02-12 ENCOUNTER — Other Ambulatory Visit: Payer: Self-pay | Admitting: Pulmonary Disease

## 2014-02-12 ENCOUNTER — Other Ambulatory Visit: Payer: Self-pay | Admitting: *Deleted

## 2014-02-12 MED ORDER — METOPROLOL SUCCINATE ER 25 MG PO TB24: 25.0000 mg | ORAL_TABLET | Freq: Every day | ORAL | Status: DC

## 2014-02-12 NOTE — Telephone Encounter (Signed)
Pt notified of myoview results today with verbal understanding and said thank you for calling so soon with the results.

## 2014-02-16 DIAGNOSIS — Z7901 Long term (current) use of anticoagulants: Secondary | ICD-10-CM | POA: Diagnosis not present

## 2014-02-16 DIAGNOSIS — Z6821 Body mass index (BMI) 21.0-21.9, adult: Secondary | ICD-10-CM | POA: Diagnosis not present

## 2014-02-16 DIAGNOSIS — I480 Paroxysmal atrial fibrillation: Secondary | ICD-10-CM | POA: Diagnosis not present

## 2014-02-16 DIAGNOSIS — E039 Hypothyroidism, unspecified: Secondary | ICD-10-CM | POA: Diagnosis not present

## 2014-02-16 DIAGNOSIS — R55 Syncope and collapse: Secondary | ICD-10-CM | POA: Diagnosis not present

## 2014-02-16 DIAGNOSIS — I7300 Raynaud's syndrome without gangrene: Secondary | ICD-10-CM | POA: Diagnosis not present

## 2014-02-16 DIAGNOSIS — N390 Urinary tract infection, site not specified: Secondary | ICD-10-CM | POA: Diagnosis not present

## 2014-02-27 DIAGNOSIS — M859 Disorder of bone density and structure, unspecified: Secondary | ICD-10-CM | POA: Diagnosis not present

## 2014-02-27 DIAGNOSIS — E785 Hyperlipidemia, unspecified: Secondary | ICD-10-CM | POA: Diagnosis not present

## 2014-02-27 DIAGNOSIS — E039 Hypothyroidism, unspecified: Secondary | ICD-10-CM | POA: Diagnosis not present

## 2014-03-06 ENCOUNTER — Other Ambulatory Visit: Payer: Self-pay | Admitting: Internal Medicine

## 2014-03-06 DIAGNOSIS — Z1231 Encounter for screening mammogram for malignant neoplasm of breast: Secondary | ICD-10-CM

## 2014-03-06 DIAGNOSIS — F4321 Adjustment disorder with depressed mood: Secondary | ICD-10-CM | POA: Diagnosis not present

## 2014-03-06 DIAGNOSIS — Z0000 Encounter for general adult medical examination without abnormal findings: Secondary | ICD-10-CM | POA: Diagnosis not present

## 2014-03-06 DIAGNOSIS — K219 Gastro-esophageal reflux disease without esophagitis: Secondary | ICD-10-CM | POA: Diagnosis not present

## 2014-03-06 DIAGNOSIS — I480 Paroxysmal atrial fibrillation: Secondary | ICD-10-CM | POA: Diagnosis not present

## 2014-03-06 DIAGNOSIS — E785 Hyperlipidemia, unspecified: Secondary | ICD-10-CM | POA: Diagnosis not present

## 2014-03-06 DIAGNOSIS — M1990 Unspecified osteoarthritis, unspecified site: Secondary | ICD-10-CM | POA: Diagnosis not present

## 2014-03-06 DIAGNOSIS — E039 Hypothyroidism, unspecified: Secondary | ICD-10-CM | POA: Diagnosis not present

## 2014-03-06 DIAGNOSIS — I7300 Raynaud's syndrome without gangrene: Secondary | ICD-10-CM | POA: Diagnosis not present

## 2014-03-07 DIAGNOSIS — Z1212 Encounter for screening for malignant neoplasm of rectum: Secondary | ICD-10-CM | POA: Diagnosis not present

## 2014-03-19 ENCOUNTER — Encounter: Payer: Self-pay | Admitting: Cardiology

## 2014-03-19 ENCOUNTER — Ambulatory Visit (INDEPENDENT_AMBULATORY_CARE_PROVIDER_SITE_OTHER): Payer: Commercial Managed Care - HMO | Admitting: Cardiology

## 2014-03-19 VITALS — BP 118/70 | HR 79 | Ht 70.0 in | Wt 153.4 lb

## 2014-03-19 DIAGNOSIS — R55 Syncope and collapse: Secondary | ICD-10-CM

## 2014-03-19 DIAGNOSIS — I480 Paroxysmal atrial fibrillation: Secondary | ICD-10-CM

## 2014-03-19 DIAGNOSIS — I7121 Aneurysm of the ascending aorta, without rupture: Secondary | ICD-10-CM

## 2014-03-19 DIAGNOSIS — I712 Thoracic aortic aneurysm, without rupture: Secondary | ICD-10-CM

## 2014-03-19 NOTE — Progress Notes (Signed)
Cardiology Office Note   Date:  03/19/2014   ID:  Whitney Medina, DOB 09/11/1932, MRN [id number]  PCP:  Tivis Ringer, MD  Cardiologist:   Darlin Coco, MD   No chief complaint on file.     History of Present Illness: Whitney Medina is a 81 y.o. female who presents for a post hospital follow-up office visit. She has a hx of post operative (knee surgery) pulmonary embolism treated with Xarelto and IVC filter (01/2013) and IVC filter was subsequently removed. She completed 6 mos of AC therapy with Xarelto. However, her Xarelto was resumed in 09/2013 after a CT scan suggested residual clot in the right lower lobe.  She was advised that she would receive an additional 6 months of anticoagulation therapy.   She was admitted in December 2015 with a syncopal episode. She was found to be in AFib with RVR. EF was noted to be down at 40-45% (possibly related to tachycardia). It was questioned whether or not her syncope may be related to post-termination pauses. She was placed on beta blocker. Long term anticoagulation with Xarelto was recommended.  Her Chadds vasc score is 5. Cardiac enzymes remained normal.  She subsequently underwent a 30 day event monitor which showed no arrhythmias.  Chest CT was negative for pulmonary embolism. Of note, her CT did show coronary atherosclerosis and stable ascending aorta dilatation. . She somewhat limited after her knee surgery last year. She denies significant dyspnea. She denies orthopnea, PND or edema.  She has noted occasional brief spells of weakness which seemed to correlate with taking her beta blocker.  She has not had further syncopal episodes and she has not been aware of any racing of her heart or tachycardia palpitations.  On 02/12/14 the patient had a Lexi scan Myoview stress test which showed no ischemia and her ejection fraction had improved to 70%.       Past Medical History  Diagnosis Date  . Hypothyroidism   .  Hypercholesteremia   . Left knee DJD   . History of anemia   . DVT (deep venous thrombosis)   . PE (pulmonary embolism)   . Cardiomyopathy     a.  Echocardiogram 12/3013:  Left ventricle: Systolic function was mildly to moderately reduced. The estimated ejection fraction was in the range of 40% to 45%. Regional wall motion abnormalities cannot be excluded.  The study is not technically sufficient to allow evaluation of LV diastolic function.  Marland Kitchen PAF (paroxysmal atrial fibrillation)   . CAD (coronary artery disease)     a. Chest CTA 01/16/14:  IMPRESSION: 1. Negative for pulmonary embolus or acute vascular abnormality. 2. Atherosclerosis and coronary artery disease. 3. Unchanged ascending aortic ectasia measuring 38 mm x 40 mm.  Recommend annual imaging followup by CTA or MRA  . Ascending aortic aneurysm     3.8 x 4 cm on CTA 12/2013  . Hx of cardiovascular stress test     Lexiscan Myoview (01/2014):  No ischemia or scar, EF 70%; Normal Study/Low Risk  . Raynaud disease   . Pulmonary embolism     Past Surgical History  Procedure Laterality Date  . Abdominal hysterectomy  1973  . Tonsillectomy  1961  . Shoulder adhesion release Left 2000  . Knee arthroscopy Left   . Total knee arthroplasty Left 12/25/2012    Procedure: TOTAL KNEE ARTHROPLASTY- left;  Surgeon: Lorn Junes, MD;  Location: [facility];  Service: Orthopedics;  Laterality: Left;  . Frozen shoulder  Current Outpatient Prescriptions  Medication Sig Dispense Refill  . acetaminophen (TYLENOL) 500 MG tablet Take 1,000 mg by mouth every 6 (six) hours as needed for moderate pain or fever (fever).     . Apple Cider Vinegar 600 MG CAPS Take 600 mg by mouth daily.    Marland Kitchen atorvastatin (LIPITOR) 20 MG tablet Take 20 mg by mouth every evening.    . Calcium Carb-Cholecalciferol (CALCIUM 1000 + D PO) Take 1 tablet by mouth daily.     . Capsicum, Cayenne, (CAYENNE FRUIT) 455 MG CAPS Take by mouth.    . Cholecalciferol (VITAMIN D) 2000  UNITS CAPS Take 1 capsule by mouth daily.    . Cinnamon 500 MG capsule Take 500 mg by mouth daily.    . feeding supplement, ENSURE COMPLETE, (ENSURE COMPLETE) LIQD Take 237 mLs by mouth 2 (two) times daily between meals. (Patient taking differently: Take 237 mLs by mouth as needed. )    . Flaxseed, Linseed, (FLAX SEED OIL) 1000 MG CAPS Take 1,000 mg by mouth daily.    Marland Kitchen levothyroxine (SYNTHROID, LEVOTHROID) 100 MCG tablet Take 100 mcg by mouth daily before breakfast.    . loratadine (CLARITIN) 10 MG tablet Take 10 mg by mouth daily.    . metoprolol succinate (TOPROL-XL) 25 MG 24 hr tablet Take 25 mg by mouth as directed. 1/2 tablet daily    . Multiple Vitamins-Minerals (MULTIVITAMIN WITH MINERALS) tablet Take 1 tablet by mouth daily.    . ranitidine (ZANTAC) 150 MG tablet Take 150 mg by mouth daily.    . rivaroxaban (XARELTO) 20 MG TABS tablet Take 1 tablet (20 mg total) by mouth daily with supper. 30 tablet 5  . traZODone (DESYREL) 50 MG tablet Take 50 mg by mouth at bedtime.    . vitamin C (ASCORBIC ACID) 500 MG tablet Take 500 mg by mouth daily.      No current facility-administered medications for this visit.    Allergies:   Codeine    Social History:  The patient  reports that she has never smoked. She has never used smokeless tobacco. She reports that she does not drink alcohol or use illicit drugs.   Family History:  The patient's family history includes Cancer - Other in her mother; Diabetes in her father; Heart attack in her sister; Heart disease (age of onset: 65) in her sister; Melanoma in her sister. There is no history of Stroke or Hypertension.    ROS:  Please see the history of present illness.   Otherwise, review of systems are positive for hypothyroidism and hypercholesterolemia.   All other systems are reviewed and negative.    PHYSICAL EXAM: VS:  BP 118/70 mmHg  Pulse 79  Ht 5\' 10"  (1.778 m)  Wt 153 lb 6.4 oz (69.582 kg)  BMI 22.01 kg/m2 , BMI Body mass index is  22.01 kg/(m^2). GEN: Well nourished, well developed, in no acute distress HEENT: normal Neck: no JVD, carotid bruits, or masses Cardiac: RRR; no murmurs, rubs, or gallops,no edema  Respiratory:  clear to auscultation bilaterally, normal work of breathing GI: soft, nontender, nondistended, + BS MS: no deformity or atrophy Skin: warm and dry, no rash Neuro:  Strength and sensation are intact Psych: euthymic mood, full affect   EKG:  EKG is not ordered today.    Recent Labs: 01/16/2014: B Natriuretic Peptide 147.5*; TSH 0.892 01/17/2014: ALT 16; Hemoglobin 12.2; Platelets 147* 01/18/2014: BUN 14; Creatinine 0.78; Potassium 4.1; Sodium 138    Lipid Panel No  results found for: CHOL, TRIG, HDL, CHOLHDL, VLDL, LDLCALC, LDLDIRECT    Wt Readings from Last 3 Encounters:  03/19/14 153 lb 6.4 oz (69.582 kg)  02/11/14 149 lb (67.586 kg)  02/06/14 149 lb (67.586 kg)      Other studies Reviewed: Additional studies/ records that were reviewed today include: .  30 day event monitor Review of the above records demonstrates: I will sinus rhythm.  No recurrent atrial fibrillation.   ASSESSMENT AND PLAN:  1.  Paroxysmal atrial fibrillation, chadsvasc score of 5, on Xarelto. 2.  History of left ventricular systolic dysfunction by echocardiogram with ejection fraction of 40-45% on 01/16/14. 3.  More recent Lexiscan Myoview shows no ischemia and shows ejection fraction of 70%.  This suggests that the prior depressed LV systolic function may have been due in part to tachycardia mediated cardiomyopathy. 4.  Past history of pulmonary embolus postop knee surgery in January 2015 5.  Dilated ascending aorta, stable 6.  Hypercholesterolemia 7.  Hypothyroidism   Current medicines are reviewed at length with the patient today.  The patient has concerns regarding medicines.  She feels that the Toprol is causing her to feel lightheaded and weak and dizzy at times  The following changes have been made:   We have decreased the dose of Toprol to just 12.5 mg daily   No orders of the defined types were placed in this encounter.   She may gradually increase her activity back to normal activity.  She may start to work out at [employer] again.  Disposition:   FU with Dr. Mare Ferrari in 2 months for office visit and EKG   Signed, Darlin Coco, MD  03/19/2014 3:57 PM    [facility] [place], [place], [place]  [address] Phone: [phone]; Fax: [phone]

## 2014-03-19 NOTE — Patient Instructions (Signed)
DECREASE YOUR TOPROL (METOPROLOL) 25 MG TO 1/2 TABLET DAILY  Your physician recommends that you schedule a follow-up appointment in: 2 MONTH OV/EKG

## 2014-03-27 ENCOUNTER — Ambulatory Visit
Admission: RE | Admit: 2014-03-27 | Discharge: 2014-03-27 | Disposition: A | Discharge status: Home / Self Care | Payer: Commercial Managed Care - HMO | Source: Ambulatory Visit | Attending: Internal Medicine | Admitting: Internal Medicine

## 2014-03-27 DIAGNOSIS — Z1231 Encounter for screening mammogram for malignant neoplasm of breast: Secondary | ICD-10-CM | POA: Diagnosis not present

## 2014-03-28 ENCOUNTER — Telehealth: Payer: Self-pay | Admitting: *Deleted

## 2014-03-28 NOTE — Telephone Encounter (Signed)
Advised patient   Event monitor reviewed by  Dr. Mare Ferrari  Interpretation: No Atrial Fib

## 2014-04-03 DIAGNOSIS — M859 Disorder of bone density and structure, unspecified: Secondary | ICD-10-CM | POA: Diagnosis not present

## 2014-05-21 ENCOUNTER — Encounter: Payer: Self-pay | Admitting: Cardiology

## 2014-05-21 ENCOUNTER — Ambulatory Visit (INDEPENDENT_AMBULATORY_CARE_PROVIDER_SITE_OTHER): Payer: Commercial Managed Care - HMO | Admitting: Cardiology

## 2014-05-21 VITALS — BP 114/64 | HR 75 | Ht 69.0 in | Wt 152.0 lb

## 2014-05-21 DIAGNOSIS — I712 Thoracic aortic aneurysm, without rupture: Secondary | ICD-10-CM | POA: Diagnosis not present

## 2014-05-21 DIAGNOSIS — I480 Paroxysmal atrial fibrillation: Secondary | ICD-10-CM

## 2014-05-21 DIAGNOSIS — I7121 Aneurysm of the ascending aorta, without rupture: Secondary | ICD-10-CM

## 2014-05-21 DIAGNOSIS — R55 Syncope and collapse: Secondary | ICD-10-CM

## 2014-05-21 NOTE — Patient Instructions (Signed)
Medication Instructions:  Your physician recommends that you continue on your current medications as directed. Please refer to the Current Medication list given to you today.  Labwork: none  Testing/Procedures: none  Follow-Up: Your physician wants you to follow-up in: 6 month ov/ekg You will receive a reminder letter in the mail two months in advance. If you don't receive a letter, please call our office to schedule the follow-up appointment.   Any Other Special Instructions Will Be Listed Below (If Applicable).   

## 2014-05-21 NOTE — Progress Notes (Signed)
Cardiology Office Note   Date:  05/21/2014   ID:  [name], DOB [birth year], MRN [id number]  PCP:  Whitney Ringer, MD  Cardiologist: Darlin Coco MD  No chief complaint on file.     History of Present Illness: Whitney Medina is a 81 y.o. female who presents for follow-up office visit  She has a hx of post operative (knee surgery) pulmonary embolism treated with Xarelto and IVC filter (01/2013) and IVC filter was subsequently removed. She completed 6 mos of AC therapy with Xarelto. However, her Xarelto was resumed in 09/2013 after a CT scan suggested residual clot in the right lower lobe. She was advised that she would receive an additional 6 months of anticoagulation therapy.   She was admitted in December 2015 with a syncopal episode. She was found to be in AFib with RVR. EF was noted to be down at 40-45% (possibly related to tachycardia). It was questioned whether or not her syncope may be related to post-termination pauses. She was placed on beta blocker. Long term anticoagulation with Xarelto was recommended. Her Chadds vasc score is 5. Cardiac enzymes remained normal. She subsequently underwent a 30 day event monitor which showed no arrhythmias. Chest CT was negative for pulmonary embolism. Of note, her CT did show coronary atherosclerosis and stable ascending aorta dilatation. . She somewhat limited after her knee surgery last year. She denies significant dyspnea. She denies orthopnea, PND or edema. She has noted occasional brief spells of weakness which seemed to correlate with taking her beta blocker. She has not had further syncopal episodes and she has not been aware of any racing of her heart or tachycardia palpitations. On 02/12/14 the patient had a Lexi scan Myoview stress test which showed no ischemia and her ejection fraction had improved to 70%. Since last visit she has been doing well.  At her last visit we cut back on her metoprolol and her  weakness and dizziness have improved.  She is exercising more.  She wears a fit bit on her wrist.  She has not been expressing any bleeding from the Xarelto.  Past Medical History  Diagnosis Date  . Hypothyroidism   . Hypercholesteremia   . Left knee DJD   . History of anemia   . DVT (deep venous thrombosis)   . PE (pulmonary embolism)   . Cardiomyopathy     a.  Echocardiogram 12/3013:  Left ventricle: Systolic function was mildly to moderately reduced. The estimated ejection fraction was in the range of 40% to 45%. Regional wall motion abnormalities cannot be excluded.  The study is not technically sufficient to allow evaluation of LV diastolic function.  Marland Kitchen PAF (paroxysmal atrial fibrillation)   . CAD (coronary artery disease)     a. Chest CTA 01/16/14:  IMPRESSION: 1. Negative for pulmonary embolus or acute vascular abnormality. 2. Atherosclerosis and coronary artery disease. 3. Unchanged ascending aortic ectasia measuring 38 mm x 40 mm.  Recommend annual imaging followup by CTA or MRA  . Ascending aortic aneurysm     3.8 x 4 cm on CTA 12/2013  . Hx of cardiovascular stress test     Lexiscan Myoview (01/2014):  No ischemia or scar, EF 70%; Normal Study/Low Risk  . Raynaud disease   . Pulmonary embolism     Past Surgical History  Procedure Laterality Date  . Abdominal hysterectomy  1973  . Tonsillectomy  1961  . Shoulder adhesion release Left 2000  . Knee arthroscopy Left   .  Total knee arthroplasty Left 12/25/2012    Procedure: TOTAL KNEE ARTHROPLASTY- left;  Surgeon: Lorn Junes, MD;  Location: [place] [facility];  Service: Orthopedics;  Laterality: Left;  . Frozen shoulder       Current Outpatient Prescriptions  Medication Sig Dispense Refill  . acetaminophen (TYLENOL) 500 MG tablet Take 1,000 mg by mouth every 6 (six) hours as needed for moderate pain or fever (fever).     . Apple Cider Vinegar 600 MG CAPS Take 600 mg by mouth daily.    Marland Kitchen atorvastatin (LIPITOR) 20 MG tablet Take 20  mg by mouth every evening.    . Calcium Carb-Cholecalciferol (CALCIUM 1000 + D PO) Take 1 tablet by mouth daily.     . Capsicum, Cayenne, (CAYENNE FRUIT) 455 MG CAPS Take 1 capsule by mouth daily.     . Cholecalciferol (VITAMIN D) 2000 UNITS CAPS Take 1 capsule by mouth daily.    . Cinnamon 500 MG capsule Take 500 mg by mouth daily.    . feeding supplement, ENSURE ENLIVE, (ENSURE ENLIVE) LIQD Take 237 mLs by mouth as needed (for in between meal supplement).    . Flaxseed, Linseed, (FLAX SEED OIL) 1000 MG CAPS Take 1,000 mg by mouth daily.    Marland Kitchen levothyroxine (SYNTHROID, LEVOTHROID) 100 MCG tablet Take 100 mcg by mouth daily before breakfast.    . loratadine (CLARITIN) 10 MG tablet Take 10 mg by mouth daily.    . metoprolol succinate (TOPROL-XL) 25 MG 24 hr tablet Take 25 mg by mouth as directed. 1/2 tablet daily    . Multiple Vitamins-Minerals (MULTIVITAMIN WITH MINERALS) tablet Take 1 tablet by mouth daily.    Marland Kitchen PARoxetine (PAXIL) 20 MG tablet Take 10 mg by mouth daily.    . ranitidine (ZANTAC) 150 MG tablet Take 150 mg by mouth daily.    . rivaroxaban (XARELTO) 20 MG TABS tablet Take 1 tablet (20 mg total) by mouth daily with supper. 30 tablet 5  . traZODone (DESYREL) 50 MG tablet Take 50 mg by mouth at bedtime.    . vitamin C (ASCORBIC ACID) 500 MG tablet Take 500 mg by mouth daily.      No current facility-administered medications for this visit.    Allergies:   Codeine    Social History:  The patient  reports that she has never smoked. She has never used smokeless tobacco. She reports that she does not drink alcohol or use illicit drugs.   Family History:  The patient's family history includes Cancer - Other in her mother; Diabetes in her father; Heart attack in her sister; Heart disease (age of onset: 54) in her sister; Melanoma in her sister. There is no history of Stroke or Hypertension.    ROS:  Please see the history of present illness.   Otherwise, review of systems are positive  for none.   All other systems are reviewed and negative.    PHYSICAL EXAM: VS:  BP 114/64 mmHg  Pulse 75  Ht 5\' 9"  (1.753 m)  Wt 152 lb (68.947 kg)  BMI 22.44 kg/m2 , BMI Body mass index is 22.44 kg/(m^2). GEN: Well nourished, well developed, in no acute distress HEENT: normal Neck: no JVD, carotid bruits, or masses Cardiac: RRR; no murmurs, rubs, or gallops,no edema  Respiratory:  clear to auscultation bilaterally, normal work of breathing GI: soft, nontender, nondistended, + BS MS: no deformity or atrophy Skin: warm and dry, no rash Neuro:  Strength and sensation are intact Psych: euthymic mood, full  affect   EKG:  EKG  ordered today. The ekg ordered today demonstrates normal sinus rhythm.  Since the previous tracing, atrial fibrillation is no longer present.   Recent Labs: 01/16/2014: B Natriuretic Peptide 147.5*; TSH 0.892 01/17/2014: ALT 16; Hemoglobin 12.2; Platelets 147* 01/18/2014: BUN 14; Creatinine 0.78; Potassium 4.1; Sodium 138    Lipid Panel No results found for: CHOL, TRIG, HDL, CHOLHDL, VLDL, LDLCALC, LDLDIRECT    Wt Readings from Last 3 Encounters:  05/21/14 152 lb (68.947 kg)  03/19/14 153 lb 6.4 oz (69.582 kg)  02/11/14 149 lb (67.586 kg)        ASSESSMENT AND PLAN:  1. Paroxysmal atrial fibrillation, chadsvasc score of 5, on Xarelto. 2. History of left ventricular systolic dysfunction by echocardiogram with ejection fraction of 40-45% on 01/16/14. 3. More recent Lexiscan Myoview shows no ischemia and shows ejection fraction of 70%. This suggests that the prior depressed LV systolic function may have been due in part to tachycardia mediated cardiomyopathy. 4. Past history of pulmonary embolus postop knee surgery in January 2015 5. Dilated ascending aorta, stable 6. Hypercholesterolemia 7. Hypothyroidism     Current medicines are reviewed at length with the patient today.  The patient does not have concerns regarding medicines.  The  following changes have been made:  no change  Labs/ tests ordered today include:   Orders Placed This Encounter  Procedures  . EKG 12-Lead     Signed, Darlin Coco MD 05/21/2014 6:17 PM    [facility] [address], [place], [place]  [address] Phone: [phone]; Fax: [phone]

## 2014-07-15 ENCOUNTER — Other Ambulatory Visit: Payer: Self-pay

## 2014-08-26 ENCOUNTER — Encounter: Payer: Self-pay | Admitting: Cardiology

## 2014-09-05 DIAGNOSIS — I712 Thoracic aortic aneurysm, without rupture: Secondary | ICD-10-CM | POA: Diagnosis not present

## 2014-09-05 DIAGNOSIS — M859 Disorder of bone density and structure, unspecified: Secondary | ICD-10-CM | POA: Diagnosis not present

## 2014-09-05 DIAGNOSIS — I480 Paroxysmal atrial fibrillation: Secondary | ICD-10-CM | POA: Diagnosis not present

## 2014-09-05 DIAGNOSIS — Z6822 Body mass index (BMI) 22.0-22.9, adult: Secondary | ICD-10-CM | POA: Diagnosis not present

## 2014-09-05 DIAGNOSIS — F4321 Adjustment disorder with depressed mood: Secondary | ICD-10-CM | POA: Diagnosis not present

## 2014-09-05 DIAGNOSIS — E039 Hypothyroidism, unspecified: Secondary | ICD-10-CM | POA: Diagnosis not present

## 2014-09-05 DIAGNOSIS — E785 Hyperlipidemia, unspecified: Secondary | ICD-10-CM | POA: Diagnosis not present

## 2014-11-21 DIAGNOSIS — H43813 Vitreous degeneration, bilateral: Secondary | ICD-10-CM | POA: Diagnosis not present

## 2014-11-21 DIAGNOSIS — H2513 Age-related nuclear cataract, bilateral: Secondary | ICD-10-CM | POA: Diagnosis not present

## 2014-11-21 DIAGNOSIS — H40013 Open angle with borderline findings, low risk, bilateral: Secondary | ICD-10-CM | POA: Diagnosis not present

## 2014-11-21 DIAGNOSIS — H532 Diplopia: Secondary | ICD-10-CM | POA: Diagnosis not present

## 2014-11-29 DIAGNOSIS — X32XXXA Exposure to sunlight, initial encounter: Secondary | ICD-10-CM | POA: Diagnosis not present

## 2014-11-29 DIAGNOSIS — L570 Actinic keratosis: Secondary | ICD-10-CM | POA: Diagnosis not present

## 2014-11-29 DIAGNOSIS — L821 Other seborrheic keratosis: Secondary | ICD-10-CM | POA: Diagnosis not present

## 2014-12-18 ENCOUNTER — Ambulatory Visit: Payer: Commercial Managed Care - HMO | Admitting: Cardiology

## 2014-12-25 ENCOUNTER — Telehealth: Payer: Self-pay | Admitting: Cardiology

## 2014-12-25 ENCOUNTER — Telehealth: Payer: Self-pay | Admitting: *Deleted

## 2014-12-25 NOTE — Telephone Encounter (Signed)
New message      Patient calling the office for samples of medication:   1.  What medication and dosage are you requesting samples for? xarelto 20mg   2.  Are you currently out of this medication? Have enough to last until 12-20.  Pt in donut hole

## 2014-12-25 NOTE — Telephone Encounter (Signed)
Left a message with her son for her to pick up her samples at  The front desk.

## 2015-01-21 DIAGNOSIS — Z96652 Presence of left artificial knee joint: Secondary | ICD-10-CM | POA: Diagnosis not present

## 2015-02-13 ENCOUNTER — Ambulatory Visit (INDEPENDENT_AMBULATORY_CARE_PROVIDER_SITE_OTHER): Payer: Commercial Managed Care - HMO | Admitting: Cardiology

## 2015-02-13 ENCOUNTER — Encounter: Payer: Self-pay | Admitting: Cardiology

## 2015-02-13 VITALS — BP 110/80 | HR 74 | Ht 70.0 in | Wt 150.0 lb

## 2015-02-13 DIAGNOSIS — I480 Paroxysmal atrial fibrillation: Secondary | ICD-10-CM

## 2015-02-13 DIAGNOSIS — R270 Ataxia, unspecified: Secondary | ICD-10-CM | POA: Diagnosis not present

## 2015-02-13 DIAGNOSIS — E785 Hyperlipidemia, unspecified: Secondary | ICD-10-CM

## 2015-02-13 NOTE — Patient Instructions (Signed)
Medication Instructions:   HOLD LIPITOR UNTIL APPT TO SEE DR AVVA   If you need a refill on your cardiac medications before your next appointment, please call your pharmacy.  Labwork: NONE ORDER TODAY    Testing/Procedures:  NONE ORDER TODAY    Follow-Up: Your physician wants you to follow-up in:  IN 6 MONTHS WITH DR Stanford Breed  You will receive a reminder letter in the mail two months in advance. If you don't receive a letter, please call our office to schedule the follow-up appointment.       Any Other Special Instructions Will Be Listed Below (If Applicable).

## 2015-02-13 NOTE — Progress Notes (Signed)
Cardiology Office Note   Date:  02/13/2015   ID:  [name], DOB [birth year], MRN [id number]  PCP:  Tivis Ringer, MD  Cardiologist: Darlin Coco MD  Chief Complaint  Patient presents with  . Follow-up    Patient denies chest pain, shortness of breath, le edema, claudication. Patient c/o balance/walking problems      History of Present Illness:   Whitney Medina is a 82 y.o. female who presents for follow-up office visit  She has a hx of post operative (knee surgery) pulmonary embolism treated with Xarelto and IVC filter (01/2013) and IVC filter was subsequently removed. She completed 6 mos of AC therapy with Xarelto. However, her Xarelto was resumed in 09/2013 after a CT scan suggested residual clot in the right lower lobe. She was advised that she would receive an additional 6 months of anticoagulation therapy.   She was admitted in December 2015 with a syncopal episode. She was found to be in AFib with RVR. EF was noted to be down at 40-45% (possibly related to tachycardia). It was questioned whether or not her syncope may be related to post-termination pauses. She was placed on beta blocker. Long term anticoagulation with Xarelto was recommended. Her Chadds vasc score is 5. Cardiac enzymes remained normal. She subsequently underwent a 30 day event monitor which showed no arrhythmias. Chest CT was negative for pulmonary embolism. Of note, her CT did show coronary atherosclerosis and stable ascending aorta dilatation. . She somewhat limited after her knee surgery last year. She denies significant dyspnea. She denies orthopnea, PND or edema. She has noted occasional brief spells of weakness which seemed to correlate with taking her beta blocker. She has not had further syncopal episodes and she has not been aware of any racing of her heart or tachycardia palpitations. On 02/12/14 the patient had a Lexi scan Myoview stress test which showed no ischemia and  her ejection fraction had improved to 70%. Since last visit she has been doing well. At her last visit we cut back on her metoprolol and her weakness and dizziness have improved. She is exercising more. She wears a fit bit on her wrist. She has not been expressing any bleeding from the Xarelto. She has been feeling somewhat unsteady in her walking and in her balance.  She thinks that it may be related to her statin therapy.  She is not having any myalgias.  She would like to try holding her atorvastatin for the next month until she sees Dr. Dagmar Hait in March. He has not had any further episodes of syncope.  She exercises regularly.  Stays physically active.  Past Medical History  Diagnosis Date  . Hypothyroidism   . Hypercholesteremia   . Left knee DJD   . History of anemia   . DVT (deep venous thrombosis) ([place])   . PE (pulmonary embolism)   . Cardiomyopathy [facility])     a.  Echocardiogram 12/3013:  Left ventricle: Systolic function was mildly to moderately reduced. The estimated ejection fraction was in the range of 40% to 45%. Regional wall motion abnormalities cannot be excluded.  The study is not technically sufficient to allow evaluation of LV diastolic function.  Marland Kitchen PAF (paroxysmal atrial fibrillation) ([place])   . CAD (coronary artery disease)     a. Chest CTA 01/16/14:  IMPRESSION: 1. Negative for pulmonary embolus or acute vascular abnormality. 2. Atherosclerosis and coronary artery disease. 3. Unchanged ascending aortic ectasia measuring 38 mm x 40 mm.  Recommend  annual imaging followup by CTA or MRA  . Ascending aortic aneurysm (HCC)     3.8 x 4 cm on CTA 12/2013  . Hx of cardiovascular stress test     Lexiscan Myoview (01/2014):  No ischemia or scar, EF 70%; Normal Study/Low Risk  . Raynaud disease   . Pulmonary embolism [facility])     Past Surgical History  Procedure Laterality Date  . Abdominal hysterectomy  1973  . Tonsillectomy  1961  . Shoulder adhesion release Left 2000  . Knee  arthroscopy Left   . Total knee arthroplasty Left 12/25/2012    Procedure: TOTAL KNEE ARTHROPLASTY- left;  Surgeon: Lorn Junes, MD;  Location: [place];  Service: Orthopedics;  Laterality: Left;  . Frozen shoulder       Current Outpatient Prescriptions  Medication Sig Dispense Refill  . acetaminophen (TYLENOL) 500 MG tablet Take 1,000 mg by mouth every 6 (six) hours as needed for moderate pain or fever (fever).     . Apple Cider Vinegar 600 MG CAPS Take 600 mg by mouth daily.    Marland Kitchen atorvastatin (LIPITOR) 20 MG tablet Take 20 mg by mouth every evening.    . Calcium Carb-Cholecalciferol (CALCIUM 1000 + D PO) Take 1 tablet by mouth daily.     . Capsicum, Cayenne, (CAYENNE FRUIT) 455 MG CAPS Take 1 capsule by mouth daily.     . Cholecalciferol (VITAMIN D) 2000 UNITS CAPS Take 1 capsule by mouth daily.    . Cinnamon 500 MG capsule Take 500 mg by mouth daily.    . feeding supplement, ENSURE ENLIVE, (ENSURE ENLIVE) LIQD Take 237 mLs by mouth as needed (for in between meal supplement).    . Flaxseed, Linseed, (FLAX SEED OIL) 1000 MG CAPS Take 1,000 mg by mouth daily.    Marland Kitchen levothyroxine (SYNTHROID, LEVOTHROID) 100 MCG tablet Take 100 mcg by mouth daily before breakfast.    . loratadine (CLARITIN) 10 MG tablet Take 10 mg by mouth daily.    . metoprolol succinate (TOPROL-XL) 25 MG 24 hr tablet Take 12.5 mg by mouth as directed. 1/2 tablet daily    . Multiple Vitamins-Minerals (MULTIVITAMIN WITH MINERALS) tablet Take 1 tablet by mouth daily.    Marland Kitchen PARoxetine (PAXIL) 20 MG tablet Take 10 mg by mouth daily.    . ranitidine (ZANTAC) 150 MG tablet Take 150 mg by mouth daily.    . rivaroxaban (XARELTO) 20 MG TABS tablet Take 1 tablet (20 mg total) by mouth daily with supper. 30 tablet 5  . traZODone (DESYREL) 50 MG tablet Take 50 mg by mouth at bedtime.    . vitamin C (ASCORBIC ACID) 500 MG tablet Take 500 mg by mouth daily.      No current facility-administered medications for this visit.    Allergies:    Codeine    Social History:  The patient  reports that she has never smoked. She has never used smokeless tobacco. She reports that she does not drink alcohol or use illicit drugs.   Family History:  The patient's family history includes Cancer - Other in her mother; Diabetes in her father; Heart attack in her sister; Heart disease (age of onset: 39) in her sister; Melanoma in her sister. There is no history of Stroke or Hypertension.    ROS:  Please see the history of present illness.   Otherwise, review of systems are positive for none.   All other systems are reviewed and negative.    PHYSICAL EXAM: VS:  BP 110/80 mmHg  Pulse 74  Ht 5\' 10"  (1.778 m)  Wt 150 lb (68.04 kg)  BMI 21.52 kg/m2 , BMI Body mass index is 21.52 kg/(m^2). GEN: Well nourished, well developed, in no acute distress HEENT: normal Neck: no JVD, carotid bruits, or masses Cardiac: RRR; no murmurs, rubs, or gallops,no edema  Respiratory:  clear to auscultation bilaterally, normal work of breathing GI: soft, nontender, nondistended, + BS MS: no deformity or atrophy Skin: warm and dry, no rash Neuro:  Strength and sensation are intact Psych: euthymic mood, full affect   EKG:  EKG is ordered today. The ekg ordered today demonstrates normal sinus rhythm.  Within normal limits.   Recent Labs: No results found for requested labs within last 365 days.    Lipid Panel No results found for: CHOL, TRIG, HDL, CHOLHDL, VLDL, LDLCALC, LDLDIRECT    Wt Readings from Last 3 Encounters:  02/13/15 150 lb (68.04 kg)  05/21/14 152 lb (68.947 kg)  03/19/14 153 lb 6.4 oz (69.582 kg)       ASSESSMENT AND PLAN:  1. Paroxysmal atrial fibrillation, chadsvasc score of 5, on Xarelto. 2. History of left ventricular systolic dysfunction by echocardiogram with ejection fraction of 40-45% on 01/16/14. 3. More recent Lexiscan Myoview shows no ischemia and shows ejection fraction of 70%. This suggests that the prior depressed  LV systolic function may have been due in part to tachycardia mediated cardiomyopathy. 4. Past history of pulmonary embolus postop knee surgery in January 2015 5. Dilated ascending aorta, stable 6. Hypercholesterolemia 7. Hypothyroidism 8.  Sense of poor balance which she thinks may be related to her atorvastatin.  Current medicines are reviewed at length with the patient today.  The patient does not have concerns regarding medicines.  The following changes have been made:  We will temporarily hold her atorvastatin for the next month and see if it makes any difference with her balance.  She will report to Dr. Dagmar Hait for her annual physical in March  Labs/ tests ordered today include:   Orders Placed This Encounter  Procedures  . EKG 12-Lead     Disposition:   Following my retirement she will follow-up with Dr. Stanford Breed in 6 months  Signed, Darlin Coco MD 02/13/2015 6:52 PM    [facility] [place], [place], [place]  [address] Phone: [phone]; Fax: [phone]

## 2015-03-24 DIAGNOSIS — M859 Disorder of bone density and structure, unspecified: Secondary | ICD-10-CM | POA: Diagnosis not present

## 2015-03-24 DIAGNOSIS — E038 Other specified hypothyroidism: Secondary | ICD-10-CM | POA: Diagnosis not present

## 2015-03-24 DIAGNOSIS — E784 Other hyperlipidemia: Secondary | ICD-10-CM | POA: Diagnosis not present

## 2015-03-31 DIAGNOSIS — Z7901 Long term (current) use of anticoagulants: Secondary | ICD-10-CM | POA: Diagnosis not present

## 2015-03-31 DIAGNOSIS — I480 Paroxysmal atrial fibrillation: Secondary | ICD-10-CM | POA: Diagnosis not present

## 2015-03-31 DIAGNOSIS — I428 Other cardiomyopathies: Secondary | ICD-10-CM | POA: Diagnosis not present

## 2015-03-31 DIAGNOSIS — Z0000 Encounter for general adult medical examination without abnormal findings: Secondary | ICD-10-CM | POA: Diagnosis not present

## 2015-03-31 DIAGNOSIS — I712 Thoracic aortic aneurysm, without rupture: Secondary | ICD-10-CM | POA: Diagnosis not present

## 2015-03-31 DIAGNOSIS — M859 Disorder of bone density and structure, unspecified: Secondary | ICD-10-CM | POA: Diagnosis not present

## 2015-03-31 DIAGNOSIS — I2699 Other pulmonary embolism without acute cor pulmonale: Secondary | ICD-10-CM | POA: Diagnosis not present

## 2015-03-31 DIAGNOSIS — I25119 Atherosclerotic heart disease of native coronary artery with unspecified angina pectoris: Secondary | ICD-10-CM | POA: Diagnosis not present

## 2015-03-31 DIAGNOSIS — D692 Other nonthrombocytopenic purpura: Secondary | ICD-10-CM | POA: Diagnosis not present

## 2015-04-22 DIAGNOSIS — H40013 Open angle with borderline findings, low risk, bilateral: Secondary | ICD-10-CM | POA: Diagnosis not present

## 2015-05-13 ENCOUNTER — Other Ambulatory Visit: Payer: Self-pay

## 2015-05-13 DIAGNOSIS — Z1231 Encounter for screening mammogram for malignant neoplasm of breast: Secondary | ICD-10-CM

## 2015-05-30 ENCOUNTER — Ambulatory Visit: Payer: Commercial Managed Care - HMO

## 2015-07-09 ENCOUNTER — Other Ambulatory Visit: Payer: Self-pay | Admitting: Internal Medicine

## 2015-07-09 DIAGNOSIS — Z1231 Encounter for screening mammogram for malignant neoplasm of breast: Secondary | ICD-10-CM

## 2015-07-18 ENCOUNTER — Ambulatory Visit
Admission: RE | Admit: 2015-07-18 | Discharge: 2015-07-18 | Disposition: A | Discharge status: Home / Self Care | Payer: Commercial Managed Care - HMO | Source: Ambulatory Visit | Attending: Internal Medicine | Admitting: Internal Medicine

## 2015-07-18 DIAGNOSIS — Z1231 Encounter for screening mammogram for malignant neoplasm of breast: Secondary | ICD-10-CM | POA: Diagnosis not present

## 2015-08-27 DIAGNOSIS — I119 Hypertensive heart disease without heart failure: Secondary | ICD-10-CM | POA: Diagnosis not present

## 2015-08-27 DIAGNOSIS — I2699 Other pulmonary embolism without acute cor pulmonale: Secondary | ICD-10-CM | POA: Diagnosis not present

## 2015-08-27 DIAGNOSIS — Z7901 Long term (current) use of anticoagulants: Secondary | ICD-10-CM | POA: Diagnosis not present

## 2015-08-27 DIAGNOSIS — E038 Other specified hypothyroidism: Secondary | ICD-10-CM | POA: Diagnosis not present

## 2015-08-27 DIAGNOSIS — J309 Allergic rhinitis, unspecified: Secondary | ICD-10-CM | POA: Diagnosis not present

## 2015-08-27 DIAGNOSIS — I25119 Atherosclerotic heart disease of native coronary artery with unspecified angina pectoris: Secondary | ICD-10-CM | POA: Diagnosis not present

## 2015-08-27 DIAGNOSIS — E784 Other hyperlipidemia: Secondary | ICD-10-CM | POA: Diagnosis not present

## 2015-09-16 ENCOUNTER — Encounter: Payer: Self-pay | Admitting: Cardiology

## 2015-10-01 NOTE — Progress Notes (Signed)
HPI: FU atrial fibrillation. Admitted December 2015 with a syncopal episode.CTA December 2015 showed atherosclerosis and descending aorta ectasia measuring 38 x 40 mm. She was found to be in AFib with RVR. EF was noted to be 40-45% (possibly related to tachycardia). It was questioned whether or not her syncope may be related to post-termination pauses. She was placed on beta blocker. Long term anticoagulation with Xarelto was recommended. Her Chadds vasc score is 5. She subsequently underwent a 30 day event monitor which showed no arrhythmias. Lexiscan nuclear study 1/16 showed no ischemia and her ejection fraction had improved to 70%. Since last seen, the patient denies any dyspnea on exertion, orthopnea, PND, pedal edema, palpitations, syncope or chest pain.    Current Outpatient Prescriptions  Medication Sig Dispense Refill  . acetaminophen (TYLENOL) 500 MG tablet Take 1,000 mg by mouth every 6 (six) hours as needed for moderate pain or fever (fever).     . Apple Cider Vinegar 600 MG CAPS Take 600 mg by mouth daily.    Marland Kitchen atorvastatin (LIPITOR) 20 MG tablet Take 20 mg by mouth every evening.    . Calcium Carb-Cholecalciferol (CALCIUM 1000 + D PO) Take 1 tablet by mouth daily.     . Cholecalciferol (VITAMIN D) 2000 UNITS CAPS Take 1 capsule by mouth daily.    . Cinnamon 500 MG capsule Take 500 mg by mouth daily.    . Flaxseed, Linseed, (FLAX SEED OIL) 1000 MG CAPS Take 1,000 mg by mouth daily.    Marland Kitchen levothyroxine (SYNTHROID, LEVOTHROID) 100 MCG tablet Take 100 mcg by mouth daily before breakfast.    . metoprolol succinate (TOPROL-XL) 25 MG 24 hr tablet Take 12.5 mg by mouth as directed. 1/2 tablet daily    . Multiple Vitamins-Minerals (MULTIVITAMIN WITH MINERALS) tablet Take 1 tablet by mouth daily.    . ranitidine (ZANTAC) 150 MG tablet Take 150 mg by mouth daily.    . rivaroxaban (XARELTO) 20 MG TABS tablet Take 1 tablet (20 mg total) by mouth daily with supper. 30 tablet 5  .  traZODone (DESYREL) 50 MG tablet Take 50 mg by mouth at bedtime.    . vitamin C (ASCORBIC ACID) 500 MG tablet Take 500 mg by mouth daily.      No current facility-administered medications for this visit.      Past Medical History:  Diagnosis Date  . Ascending aortic aneurysm (HCC)    3.8 x 4 cm on CTA 12/2013  . CAD (coronary artery disease)    a. Chest CTA 01/16/14:  IMPRESSION: 1. Negative for pulmonary embolus or acute vascular abnormality. 2. Atherosclerosis and coronary artery disease. 3. Unchanged ascending aortic ectasia measuring 38 mm x 40 mm.  Recommend annual imaging followup by CTA or MRA  . Cardiomyopathy [facility])    a.  Echocardiogram 12/3013:  Left ventricle: Systolic function was mildly to moderately reduced. The estimated ejection fraction was in the range of 40% to 45%. Regional wall motion abnormalities cannot be excluded.  The study is not technically sufficient to allow evaluation of LV diastolic function.  . DVT (deep venous thrombosis) ([place])   . History of anemia   . Hx of cardiovascular stress test    Lexiscan Myoview (01/2014):  No ischemia or scar, EF 70%; Normal Study/Low Risk  . Hypercholesteremia   . Hypothyroidism   . Left knee DJD   . PAF (paroxysmal atrial fibrillation) ([place])   . PE (pulmonary embolism)   . Pulmonary embolism ([place])   .  Raynaud disease     Past Surgical History:  Procedure Laterality Date  . ABDOMINAL HYSTERECTOMY  1973  . FROZEN SHOULDER    . KNEE ARTHROSCOPY Left   . SHOULDER ADHESION RELEASE Left 2000  . TONSILLECTOMY  1961  . TOTAL KNEE ARTHROPLASTY Left 12/25/2012   Procedure: TOTAL KNEE ARTHROPLASTY- left;  Surgeon: Lorn Junes, MD;  Location: [place];  Service: Orthopedics;  Laterality: Left;    Social History   Social History  . Marital status: Widowed    Spouse name: N/A  . Number of children: N/A  . Years of education: N/A   Occupational History  . Not on file.   Social History Main Topics  . Smoking status:  Never Smoker  . Smokeless tobacco: Never Used  . Alcohol use No     Comment: Rare glass of wine  . Drug use: No  . Sexual activity: Not on file   Other Topics Concern  . Not on file   Social History Narrative  . No narrative on file    Family History  Problem Relation Age of Onset  . Cancer - Other Mother   . Diabetes Father   . Melanoma Sister   . Heart disease Sister 51    CABG  . Heart attack Sister   . Stroke Neg Hx   . Hypertension Neg Hx     ROS: no fevers or chills, productive cough, hemoptysis, dysphasia, odynophagia, melena, hematochezia, dysuria, hematuria, rash, seizure activity, orthopnea, PND, pedal edema, claudication. Remaining systems are negative.  Physical Exam: Well-developed well-nourished in no acute distress.  Skin is warm and dry.  HEENT is normal.  Neck is supple.  Chest is clear to auscultation with normal expansion.  Cardiovascular exam is regular rate and rhythm.  Abdominal exam nontender or distended. No masses palpated. Extremities show no edema. neuro grossly intact  ECG-Sinus rhythm at a rate of 74. Nonspecific ST changes.  A/P  1 Paroxysmal atrial fibrillation-patient remains in sinus rhythm. Continue Toprol. Continue xarelto. Check hemoglobin and renal function.  2 hyperlipidemia-continue statin.  3 dilated thoracic aorta-and repeat CTA.  4 prior pulmonary embolus  Kirk Ruths, MD

## 2015-10-02 ENCOUNTER — Encounter: Payer: Self-pay | Admitting: Cardiology

## 2015-10-02 ENCOUNTER — Ambulatory Visit (INDEPENDENT_AMBULATORY_CARE_PROVIDER_SITE_OTHER): Payer: Commercial Managed Care - HMO | Admitting: Cardiology

## 2015-10-02 VITALS — BP 135/82 | HR 74 | Ht 70.0 in | Wt 149.8 lb

## 2015-10-02 DIAGNOSIS — I712 Thoracic aortic aneurysm, without rupture, unspecified: Secondary | ICD-10-CM

## 2015-10-02 DIAGNOSIS — I4891 Unspecified atrial fibrillation: Secondary | ICD-10-CM

## 2015-10-02 LAB — CBC
HCT: 41.3 % (ref 35.0–45.0)
Hemoglobin: 14 g/dL (ref 11.7–15.5)
MCH: 31.7 pg (ref 27.0–33.0)
MCHC: 33.9 g/dL (ref 32.0–36.0)
MCV: 93.4 fL (ref 80.0–100.0)
MPV: 9.9 fL (ref 7.5–12.5)
PLATELETS: 159 10*3/uL (ref 140–400)
RBC: 4.42 MIL/uL (ref 3.80–5.10)
RDW: 14 % (ref 11.0–15.0)
WBC: 5 10*3/uL (ref 3.8–10.8)

## 2015-10-02 NOTE — Patient Instructions (Signed)
Medication Instructions:   NO CHANGE  Labwork:  Your physician recommends that you HAVE LAB WORK TODAY  Testing/Procedures:  Non-Cardiac CT Angiography (CTA), is a special type of CT scan that uses a computer to produce multi-dimensional views of major blood vessels throughout the body. In CT angiography, a contrast material is injected through an IV to help visualize the blood vessels  CTA OF THE CHEST TO FOLLOW UP THORACIC ANEURYSM AT THE [address] [facility]ATION  Follow-Up:  Your physician wants you to follow-up in: [place] will receive a reminder letter in the mail two months in advance. If you don't receive a letter, please call our office to schedule the follow-up appointment.  If you need a refill on your cardiac medications before your next appointment, please call your pharmacy.

## 2015-10-03 LAB — BASIC METABOLIC PANEL
BUN: 16 mg/dL (ref 7–25)
CHLORIDE: 105 mmol/L (ref 98–110)
CO2: 28 mmol/L (ref 20–31)
Calcium: 9 mg/dL (ref 8.6–10.4)
Creat: 0.88 mg/dL (ref 0.60–0.88)
Glucose, Bld: 88 mg/dL (ref 65–99)
POTASSIUM: 4.6 mmol/L (ref 3.5–5.3)
SODIUM: 142 mmol/L (ref 135–146)

## 2015-10-10 ENCOUNTER — Ambulatory Visit (INDEPENDENT_AMBULATORY_CARE_PROVIDER_SITE_OTHER)
Admission: RE | Admit: 2015-10-10 | Discharge: 2015-10-10 | Disposition: A | Discharge status: Home / Self Care | Payer: Commercial Managed Care - HMO | Source: Ambulatory Visit | Attending: Cardiology | Admitting: Cardiology

## 2015-10-10 DIAGNOSIS — I712 Thoracic aortic aneurysm, without rupture, unspecified: Secondary | ICD-10-CM

## 2015-10-10 DIAGNOSIS — I77810 Thoracic aortic ectasia: Secondary | ICD-10-CM | POA: Diagnosis not present

## 2015-10-10 MED ORDER — IOPAMIDOL (ISOVUE-370) INJECTION 76%
100.0000 mL | Freq: Once | INTRAVENOUS | Status: AC | PRN
  Administered 2015-10-10: 100 mL via INTRAVENOUS

## 2015-12-25 ENCOUNTER — Telehealth: Payer: Self-pay | Admitting: Cardiology

## 2015-12-25 NOTE — Telephone Encounter (Signed)
Patient calling the office for samples of medication:   1.  What medication and dosage are you requesting samples for?Xarelto   2.  Are you currently out of this medication? No ( have a week and a half left) in the donut hole

## 2015-12-25 NOTE — Telephone Encounter (Signed)
Advised pt no samples available, to please try back in a few days as we may be restocked.

## 2016-03-11 DIAGNOSIS — Z96652 Presence of left artificial knee joint: Secondary | ICD-10-CM | POA: Diagnosis not present

## 2016-03-22 ENCOUNTER — Ambulatory Visit (INDEPENDENT_AMBULATORY_CARE_PROVIDER_SITE_OTHER): Payer: Commercial Managed Care - HMO | Admitting: Cardiology

## 2016-03-22 ENCOUNTER — Encounter: Payer: Self-pay | Admitting: Cardiology

## 2016-03-22 VITALS — BP 130/80 | HR 67 | Ht 70.0 in | Wt 150.0 lb

## 2016-03-22 DIAGNOSIS — I712 Thoracic aortic aneurysm, without rupture, unspecified: Secondary | ICD-10-CM

## 2016-03-22 DIAGNOSIS — I480 Paroxysmal atrial fibrillation: Secondary | ICD-10-CM | POA: Diagnosis not present

## 2016-03-22 LAB — BASIC METABOLIC PANEL
BUN: 14 mg/dL (ref 7–25)
CALCIUM: 9.7 mg/dL (ref 8.6–10.4)
CO2: 31 mmol/L (ref 20–31)
CREATININE: 0.87 mg/dL (ref 0.60–0.88)
Chloride: 104 mmol/L (ref 98–110)
GLUCOSE: 96 mg/dL (ref 65–99)
Potassium: 5.2 mmol/L (ref 3.5–5.3)
SODIUM: 141 mmol/L (ref 135–146)

## 2016-03-22 LAB — CBC
HCT: 43.7 % (ref 35.0–45.0)
Hemoglobin: 14.4 g/dL (ref 11.7–15.5)
MCH: 30.8 pg (ref 27.0–33.0)
MCHC: 33 g/dL (ref 32.0–36.0)
MCV: 93.6 fL (ref 80.0–100.0)
MPV: 9.6 fL (ref 7.5–12.5)
PLATELETS: 151 10*3/uL (ref 140–400)
RBC: 4.67 MIL/uL (ref 3.80–5.10)
RDW: 13.7 % (ref 11.0–15.0)
WBC: 4.7 10*3/uL (ref 3.8–10.8)

## 2016-03-22 NOTE — Progress Notes (Signed)
HPI: FU atrial fibrillation. Admitted December 2015 with a syncopal episode.She was found to be in AFib with RVR. EF was noted to be 40-45% (possibly related to tachycardia). It was questioned whether or not her syncope may be related to post-termination pauses. She was placed on beta blocker. Long term anticoagulation with Xarelto was recommended. Her Chadsvasc score is 5. She subsequently underwent a 30 day event monitor which showed no arrhythmias. Lexiscan nuclear study 1/16 showed no ischemia and her ejection fraction had improved to 70%. CTA September 2017 showed dilated ascending aorta measured at 4.1 x 3.8 cm. Since last seen, the patient denies any dyspnea on exertion, orthopnea, PND, pedal edema, palpitations, syncope or chest pain.   Current Outpatient Prescriptions  Medication Sig Dispense Refill  . acetaminophen (TYLENOL) 500 MG tablet Take 1,000 mg by mouth every 6 (six) hours as needed for moderate pain or fever (fever).     . Apple Cider Vinegar 600 MG CAPS Take 600 mg by mouth daily.    Marland Kitchen atorvastatin (LIPITOR) 20 MG tablet Take 20 mg by mouth every evening.    . Calcium Carb-Cholecalciferol (CALCIUM 1000 + D PO) Take 1 tablet by mouth daily.     . cholecalciferol (VITAMIN D) 1000 units tablet Take 1 capsule by mouth daily.    . Cinnamon 500 MG capsule Take 500 mg by mouth daily.    . Flaxseed, Linseed, (FLAX SEED OIL) 1000 MG CAPS Take 1,000 mg by mouth daily.    Marland Kitchen levothyroxine (SYNTHROID, LEVOTHROID) 100 MCG tablet Take 100 mcg by mouth daily before breakfast.    . metoprolol succinate (TOPROL-XL) 25 MG 24 hr tablet Take 12.5 mg by mouth as directed. 1/2 tablet daily    . Multiple Vitamins-Minerals (MULTIVITAMIN WITH MINERALS) tablet Take 1 tablet by mouth daily.    . ranitidine (ZANTAC) 150 MG tablet Take 150 mg by mouth daily.    . rivaroxaban (XARELTO) 20 MG TABS tablet Take 1 tablet (20 mg total) by mouth daily with supper. 30 tablet 5  . traZODone (DESYREL)  50 MG tablet Take 50 mg by mouth at bedtime.    . vitamin C (ASCORBIC ACID) 500 MG tablet Take 500 mg by mouth daily.      No current facility-administered medications for this visit.      Past Medical History:  Diagnosis Date  . Ascending aortic aneurysm (HCC)    3.8 x 4 cm on CTA 12/2013  . CAD (coronary artery disease)    a. Chest CTA 01/16/14:  IMPRESSION: 1. Negative for pulmonary embolus or acute vascular abnormality. 2. Atherosclerosis and coronary artery disease. 3. Unchanged ascending aortic ectasia measuring 38 mm x 40 mm.  Recommend annual imaging followup by CTA or MRA  . Cardiomyopathy [facility])    a.  Echocardiogram 12/3013:  Left ventricle: Systolic function was mildly to moderately reduced. The estimated ejection fraction was in the range of 40% to 45%. Regional wall motion abnormalities cannot be excluded.  The study is not technically sufficient to allow evaluation of LV diastolic function.  . DVT (deep venous thrombosis) ([place])   . History of anemia   . Hx of cardiovascular stress test    Lexiscan Myoview (01/2014):  No ischemia or scar, EF 70%; Normal Study/Low Risk  . Hypercholesteremia   . Hypothyroidism   . Left knee DJD   . PAF (paroxysmal atrial fibrillation) ([place])   . PE (pulmonary embolism)   . Pulmonary embolism ([place])   .  Raynaud disease     Past Surgical History:  Procedure Laterality Date  . ABDOMINAL HYSTERECTOMY  1973  . FROZEN SHOULDER    . KNEE ARTHROSCOPY Left   . SHOULDER ADHESION RELEASE Left 2000  . TONSILLECTOMY  1961  . TOTAL KNEE ARTHROPLASTY Left 12/25/2012   Procedure: TOTAL KNEE ARTHROPLASTY- left;  Surgeon: Lorn Junes, MD;  Location: [place];  Service: Orthopedics;  Laterality: Left;    Social History   Social History  . Marital status: Widowed    Spouse name: N/A  . Number of children: N/A  . Years of education: N/A   Occupational History  . Not on file.   Social History Main Topics  . Smoking status: Never Smoker  .  Smokeless tobacco: Never Used  . Alcohol use No     Comment: Rare glass of wine  . Drug use: No  . Sexual activity: Not on file   Other Topics Concern  . Not on file   Social History Narrative  . No narrative on file    Family History  Problem Relation Age of Onset  . Cancer - Other Mother   . Diabetes Father   . Melanoma Sister   . Heart disease Sister 63    CABG  . Heart attack Sister   . Stroke Neg Hx   . Hypertension Neg Hx     ROS: no fevers or chills, productive cough, hemoptysis, dysphasia, odynophagia, melena, hematochezia, dysuria, hematuria, rash, seizure activity, orthopnea, PND, pedal edema, claudication. Remaining systems are negative.  Physical Exam: Well-developed well-nourished in no acute distress.  Skin is warm and dry.  HEENT is normal.  Neck is supple. No bruits Chest is clear to auscultation with normal expansion.  Cardiovascular exam is regular rate and rhythm.  Abdominal exam nontender or distended. No masses palpated. Extremities show no edema. neuro grossly intact  ECG- Sinus rhythm at a rate of 67. Nonspecific ST changes; personally reviewed  A/P  1 Paroxysmal atrial fibrillation-patient remains in sinus rhythm. We will continue with Toprol for rate control if atrial fibrillation recurs. Continue xarelto. Check hemoglobin and renal function.  2 thoracic aortic aneurysm-plan follow-up CTA September 2018.  3 hyperlipidemia-continue statin.   4 prior pulmonary embolus-she remains on anticoagulation for history of atrial fibrillation.  Kirk Ruths, MD

## 2016-03-22 NOTE — Patient Instructions (Signed)

## 2016-03-30 ENCOUNTER — Ambulatory Visit: Payer: Commercial Managed Care - HMO | Admitting: Cardiology

## 2016-04-02 DIAGNOSIS — E038 Other specified hypothyroidism: Secondary | ICD-10-CM | POA: Diagnosis not present

## 2016-04-02 DIAGNOSIS — Z0000 Encounter for general adult medical examination without abnormal findings: Secondary | ICD-10-CM | POA: Diagnosis not present

## 2016-04-02 DIAGNOSIS — E784 Other hyperlipidemia: Secondary | ICD-10-CM | POA: Diagnosis not present

## 2016-04-02 DIAGNOSIS — M859 Disorder of bone density and structure, unspecified: Secondary | ICD-10-CM | POA: Diagnosis not present

## 2016-04-12 DIAGNOSIS — Z0000 Encounter for general adult medical examination without abnormal findings: Secondary | ICD-10-CM | POA: Diagnosis not present

## 2016-04-12 DIAGNOSIS — I480 Paroxysmal atrial fibrillation: Secondary | ICD-10-CM | POA: Diagnosis not present

## 2016-04-12 DIAGNOSIS — I2699 Other pulmonary embolism without acute cor pulmonale: Secondary | ICD-10-CM | POA: Diagnosis not present

## 2016-04-12 DIAGNOSIS — Z7901 Long term (current) use of anticoagulants: Secondary | ICD-10-CM | POA: Diagnosis not present

## 2016-04-12 DIAGNOSIS — I119 Hypertensive heart disease without heart failure: Secondary | ICD-10-CM | POA: Diagnosis not present

## 2016-04-12 DIAGNOSIS — I712 Thoracic aortic aneurysm, without rupture: Secondary | ICD-10-CM | POA: Diagnosis not present

## 2016-04-12 DIAGNOSIS — I7300 Raynaud's syndrome without gangrene: Secondary | ICD-10-CM | POA: Diagnosis not present

## 2016-04-12 DIAGNOSIS — I25119 Atherosclerotic heart disease of native coronary artery with unspecified angina pectoris: Secondary | ICD-10-CM | POA: Diagnosis not present

## 2016-04-12 DIAGNOSIS — I429 Cardiomyopathy, unspecified: Secondary | ICD-10-CM | POA: Diagnosis not present

## 2016-04-14 DIAGNOSIS — Z1212 Encounter for screening for malignant neoplasm of rectum: Secondary | ICD-10-CM | POA: Diagnosis not present

## 2016-04-15 DIAGNOSIS — H40013 Open angle with borderline findings, low risk, bilateral: Secondary | ICD-10-CM | POA: Diagnosis not present

## 2016-04-15 DIAGNOSIS — H532 Diplopia: Secondary | ICD-10-CM | POA: Diagnosis not present

## 2016-04-15 DIAGNOSIS — H2513 Age-related nuclear cataract, bilateral: Secondary | ICD-10-CM | POA: Diagnosis not present

## 2016-04-15 DIAGNOSIS — H25013 Cortical age-related cataract, bilateral: Secondary | ICD-10-CM | POA: Diagnosis not present

## 2016-06-01 DIAGNOSIS — M859 Disorder of bone density and structure, unspecified: Secondary | ICD-10-CM | POA: Diagnosis not present

## 2016-08-04 ENCOUNTER — Other Ambulatory Visit: Payer: Self-pay | Admitting: Internal Medicine

## 2016-08-04 ENCOUNTER — Telehealth: Payer: Self-pay | Admitting: Cardiology

## 2016-08-04 DIAGNOSIS — Z1231 Encounter for screening mammogram for malignant neoplasm of breast: Secondary | ICD-10-CM

## 2016-08-04 NOTE — Telephone Encounter (Signed)
Spoke with patient, I left samples upfront for her. She will come and get them

## 2016-08-04 NOTE — Telephone Encounter (Signed)
Patient calling the office for samples of medication: ° ° °1.  What medication and dosage are you requesting samples for? Xarelto ° °2.  Are you currently out of this medication? Just a few ° °

## 2016-08-12 ENCOUNTER — Ambulatory Visit
Admission: RE | Admit: 2016-08-12 | Discharge: 2016-08-12 | Disposition: A | Discharge status: Home / Self Care | Payer: Commercial Managed Care - HMO | Source: Ambulatory Visit | Attending: Internal Medicine | Admitting: Internal Medicine

## 2016-08-12 DIAGNOSIS — Z1231 Encounter for screening mammogram for malignant neoplasm of breast: Secondary | ICD-10-CM

## 2016-09-23 ENCOUNTER — Telehealth: Payer: Self-pay | Admitting: *Deleted

## 2016-09-23 DIAGNOSIS — I712 Thoracic aortic aneurysm, without rupture, unspecified: Secondary | ICD-10-CM

## 2016-09-23 NOTE — Telephone Encounter (Signed)
Spoke with pt, CTA of chest to follow up thoracic aneurysm 10-11-16 @ 9:30 am. She will come by the office and have a bmp prior to the scan. No solids 2 hours prior and liquids are fine.

## 2016-09-29 NOTE — Progress Notes (Signed)
HPI: FU atrial fibrillation.Admitted December 2015 with a syncopal episode.She was found to be in AFib with RVR. EF was noted to be 40-45% (possibly related to tachycardia). It was questioned whether or not her syncope may be related to post-termination pauses. She was placed on beta blocker. Long term anticoagulation with Xarelto was recommended. Her Chadsvasc score is 5. She subsequently underwent a 30 day event monitor which showed no arrhythmias. Lexiscan nuclear study 1/16 showed no ischemia and her ejection fraction had improved to 70%. CTA September 2018 showed ectatic ascending thoracic aorta measuring 3.7 cm. Since last seen, the patient has dyspnea with more extreme activities but not with routine activities. It is relieved with rest. It is not associated with chest pain. There is no orthopnea, PND or pedal edema. There is no syncope or palpitations. There is no exertional chest pain.   Current Outpatient Prescriptions  Medication Sig Dispense Refill  . acetaminophen (TYLENOL) 500 MG tablet Take 1,000 mg by mouth every 6 (six) hours as needed for moderate pain or fever (fever).     . Apple Cider Vinegar 600 MG CAPS Take 600 mg by mouth daily.    Marland Kitchen atorvastatin (LIPITOR) 20 MG tablet Take 20 mg by mouth every evening.    . Calcium Carb-Cholecalciferol (CALCIUM 1000 + D PO) Take 1 tablet by mouth daily.     . cholecalciferol (VITAMIN D) 1000 units tablet Take 1 capsule by mouth daily.    . Cinnamon 500 MG capsule Take 500 mg by mouth daily.    . Flaxseed, Linseed, (FLAX SEED OIL) 1000 MG CAPS Take 1,000 mg by mouth daily.    Marland Kitchen levothyroxine (SYNTHROID, LEVOTHROID) 100 MCG tablet Take 100 mcg by mouth daily before breakfast.    . metoprolol succinate (TOPROL-XL) 25 MG 24 hr tablet Take 12.5 mg by mouth as directed. 1/2 tablet daily    . Multiple Vitamins-Minerals (MULTIVITAMIN WITH MINERALS) tablet Take 1 tablet by mouth daily.    . ranitidine (ZANTAC) 150 MG tablet Take 150 mg  by mouth daily.    . rivaroxaban (XARELTO) 20 MG TABS tablet Take 1 tablet (20 mg total) by mouth daily with supper. 30 tablet 5  . traZODone (DESYREL) 50 MG tablet Take 50 mg by mouth at bedtime.    . vitamin C (ASCORBIC ACID) 500 MG tablet Take 500 mg by mouth daily.      No current facility-administered medications for this visit.      Past Medical History:  Diagnosis Date  . Ascending aortic aneurysm (HCC)    3.8 x 4 cm on CTA 12/2013  . CAD (coronary artery disease)    a. Chest CTA 01/16/14:  IMPRESSION: 1. Negative for pulmonary embolus or acute vascular abnormality. 2. Atherosclerosis and coronary artery disease. 3. Unchanged ascending aortic ectasia measuring 38 mm x 40 mm.  Recommend annual imaging followup by CTA or MRA  . Cardiomyopathy [facility])    a.  Echocardiogram 12/3013:  Left ventricle: Systolic function was mildly to moderately reduced. The estimated ejection fraction was in the range of 40% to 45%. Regional wall motion abnormalities cannot be excluded.  The study is not technically sufficient to allow evaluation of LV diastolic function.  . DVT (deep venous thrombosis) ([place])   . History of anemia   . Hx of cardiovascular stress test    Lexiscan Myoview (01/2014):  No ischemia or scar, EF 70%; Normal Study/Low Risk  . Hypercholesteremia   . Hypothyroidism   .  Left knee DJD   . PAF (paroxysmal atrial fibrillation) ([place])   . PE (pulmonary embolism)   . Pulmonary embolism ([place])   . Raynaud disease     Past Surgical History:  Procedure Laterality Date  . ABDOMINAL HYSTERECTOMY  1973  . FROZEN SHOULDER    . KNEE ARTHROSCOPY Left   . SHOULDER ADHESION RELEASE Left 2000  . TONSILLECTOMY  1961  . TOTAL KNEE ARTHROPLASTY Left 12/25/2012   Procedure: TOTAL KNEE ARTHROPLASTY- left;  Surgeon: Lorn Junes, MD;  Location: [place];  Service: Orthopedics;  Laterality: Left;    Social History   Social History  . Marital status: Widowed    Spouse name: N/A  . Number of  children: N/A  . Years of education: N/A   Occupational History  . Not on file.   Social History Main Topics  . Smoking status: Never Smoker  . Smokeless tobacco: Never Used  . Alcohol use No     Comment: Rare glass of wine  . Drug use: No  . Sexual activity: Not on file   Other Topics Concern  . Not on file   Social History Narrative  . No narrative on file    Family History  Problem Relation Age of Onset  . Cancer - Other Mother   . Diabetes Father   . Melanoma Sister   . Heart disease Sister 33       CABG  . Heart attack Sister   . Stroke Neg Hx   . Hypertension Neg Hx     ROS: no fevers or chills, productive cough, hemoptysis, dysphasia, odynophagia, melena, hematochezia, dysuria, hematuria, rash, seizure activity, orthopnea, PND, pedal edema, claudication. Remaining systems are negative.  Physical Exam: Well-developed well-nourished in no acute distress.  Skin is warm and dry.  HEENT is normal.  Neck is supple.  Chest is clear to auscultation with normal expansion.  Cardiovascular exam is regular rate and rhythm.  Abdominal exam nontender or distended. No masses palpated. Extremities show no edema. neuro grossly intact   A/P  1 Paroxysmal atrial fibrillation-patient remains in sinus rhythm on examination. We will continue with Toprol for rate control if atrial fibrillation recurs. Continue xarelto. Recent Cr 0.99.   2 thoracic aortic aneurysm-follow-up study today showed maximum dimension 3.7 cm. We will not pursue further studies.  3 history of pulmonary embolus-patient remains on anticoagulation in light of history of atrial fibrillation.  4 hyperlipidemia-continue statin.  Kirk Ruths, MD

## 2016-10-04 DIAGNOSIS — I712 Thoracic aortic aneurysm, without rupture: Secondary | ICD-10-CM | POA: Diagnosis not present

## 2016-10-05 LAB — BASIC METABOLIC PANEL
BUN / CREAT RATIO: 13 (ref 12–28)
BUN: 13 mg/dL (ref 8–27)
CO2: 27 mmol/L (ref 20–29)
CREATININE: 0.99 mg/dL (ref 0.57–1.00)
Calcium: 10.1 mg/dL (ref 8.7–10.3)
Chloride: 98 mmol/L (ref 96–106)
GFR calc Af Amer: 61 mL/min/{1.73_m2} (ref >59)
GFR calc non Af Amer: 53 mL/min/{1.73_m2} — ABNORMAL LOW (ref >59)
GLUCOSE: 74 mg/dL (ref 65–99)
Potassium: 5.1 mmol/L (ref 3.5–5.2)
Sodium: 139 mmol/L (ref 134–144)

## 2016-10-11 ENCOUNTER — Ambulatory Visit (INDEPENDENT_AMBULATORY_CARE_PROVIDER_SITE_OTHER)
Admission: RE | Admit: 2016-10-11 | Discharge: 2016-10-11 | Disposition: A | Discharge status: Home / Self Care | Payer: Medicare HMO | Source: Ambulatory Visit | Attending: Cardiology | Admitting: Cardiology

## 2016-10-11 ENCOUNTER — Ambulatory Visit (INDEPENDENT_AMBULATORY_CARE_PROVIDER_SITE_OTHER): Payer: Medicare HMO | Admitting: Cardiology

## 2016-10-11 ENCOUNTER — Encounter: Payer: Self-pay | Admitting: Cardiology

## 2016-10-11 VITALS — BP 134/66 | HR 72 | Ht 70.0 in | Wt 150.0 lb

## 2016-10-11 DIAGNOSIS — I480 Paroxysmal atrial fibrillation: Secondary | ICD-10-CM

## 2016-10-11 DIAGNOSIS — I712 Thoracic aortic aneurysm, without rupture, unspecified: Secondary | ICD-10-CM

## 2016-10-11 DIAGNOSIS — E7800 Pure hypercholesterolemia, unspecified: Secondary | ICD-10-CM | POA: Diagnosis not present

## 2016-10-11 MED ORDER — IOPAMIDOL (ISOVUE-370) INJECTION 76%
100.0000 mL | Freq: Once | INTRAVENOUS | Status: AC | PRN
  Administered 2016-10-11: 100 mL via INTRAVENOUS

## 2016-10-11 NOTE — Patient Instructions (Signed)
Your physician wants you to follow-up in: 6 MONTHS WITH DR CRENSHAW You will receive a reminder letter in the mail two months in advance. If you don't receive a letter, please call our office to schedule the follow-up appointment.   If you need a refill on your cardiac medications before your next appointment, please call your pharmacy.  

## 2016-10-14 DIAGNOSIS — H00025 Hordeolum internum left lower eyelid: Secondary | ICD-10-CM | POA: Diagnosis not present

## 2016-10-14 DIAGNOSIS — H40013 Open angle with borderline findings, low risk, bilateral: Secondary | ICD-10-CM | POA: Diagnosis not present

## 2016-11-13 DIAGNOSIS — Z23 Encounter for immunization: Secondary | ICD-10-CM | POA: Diagnosis not present

## 2016-12-23 ENCOUNTER — Telehealth: Payer: Self-pay | Admitting: Cardiology

## 2016-12-23 NOTE — Telephone Encounter (Signed)
Message was left on the patient's voicemail that we are currently out of samples. If she needs a prescription, please call the office back.

## 2016-12-23 NOTE — Telephone Encounter (Signed)
New message    Patient calling the office for samples of medication:   1.  What medication and dosage are you requesting samples for?rivaroxaban (XARELTO) 20 MG TABS tablet  2.  Are you currently out of this medication? 2 more doses

## 2017-03-01 IMAGING — CT CT ANGIO CHEST
2 of 7 series · 18 of 46 positions shown · IV contrast (isovue)
Comparison: January 16, 2014

CLINICAL DATA: Ascending thoracic aortic dilatation

EXAM:
CT ANGIOGRAPHY CHEST WITH CONTRAST
TECHNIQUE: Multidetector CT imaging of the chest was performed using the
standard protocol during bolus administration of intravenous
contrast. Multiplanar CT image reconstructions and MIPs were
obtained to evaluate the vascular anatomy.
CONTRAST:  100 mL Isovue 370 nonionic

[Series 4: aorta 3.0 i31f 2 · axial · 0.71mm/px · z∈[-304,-22]mm · 15 of 102 slices shown]
[im 4/102  lung]
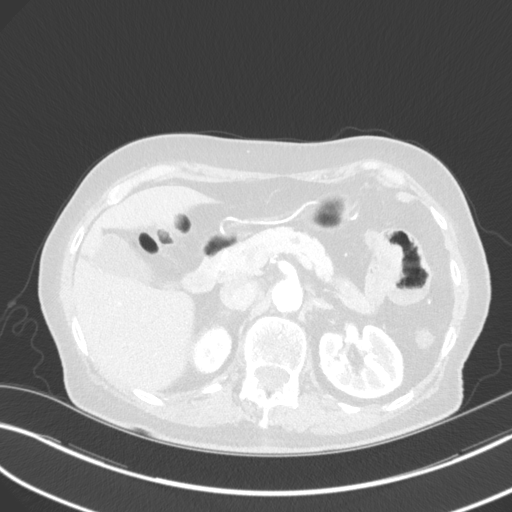
[im 12/102  soft-tissue]
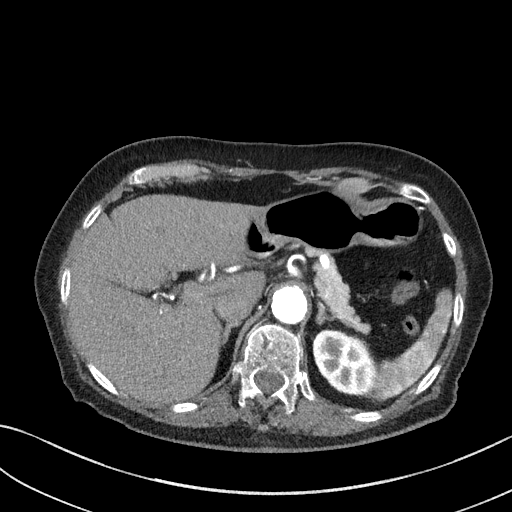
[im 19/102  lung]
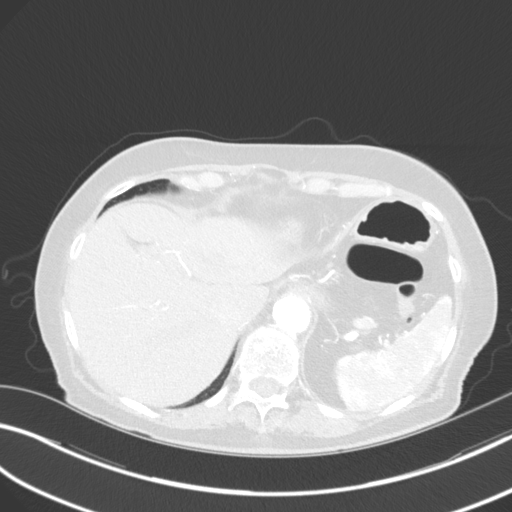
[im 27/102  soft-tissue]
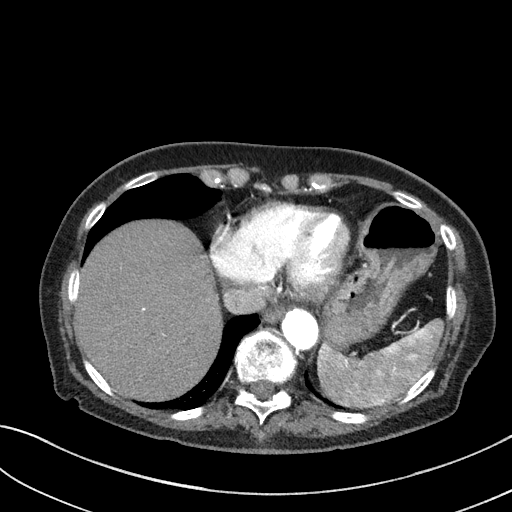
[im 30/102  lung]
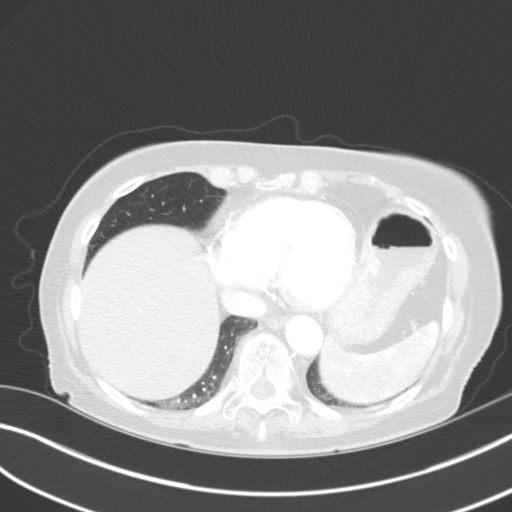
[im 38/102  soft-tissue]
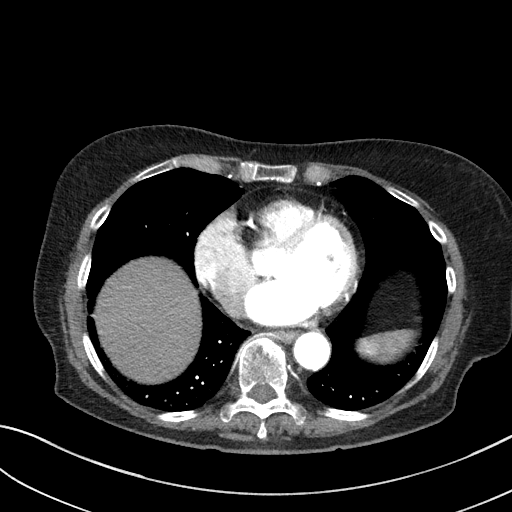
[im 45/102  lung]
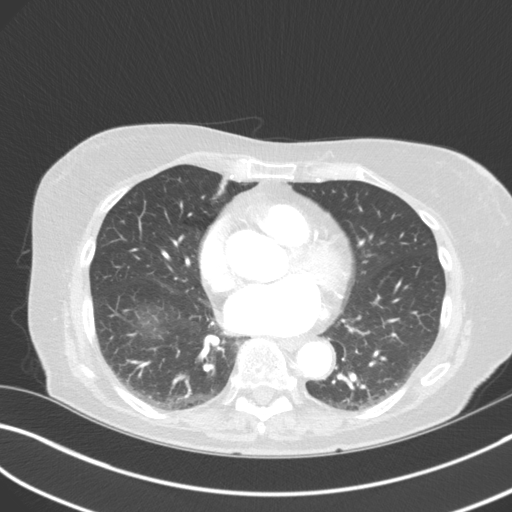
[im 53/102  soft-tissue]
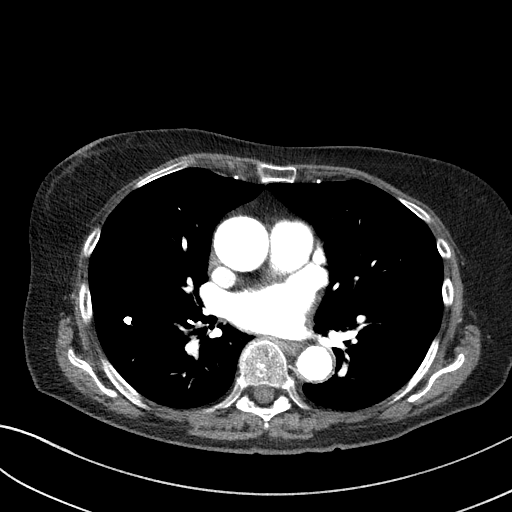
[im 57/102  lung]
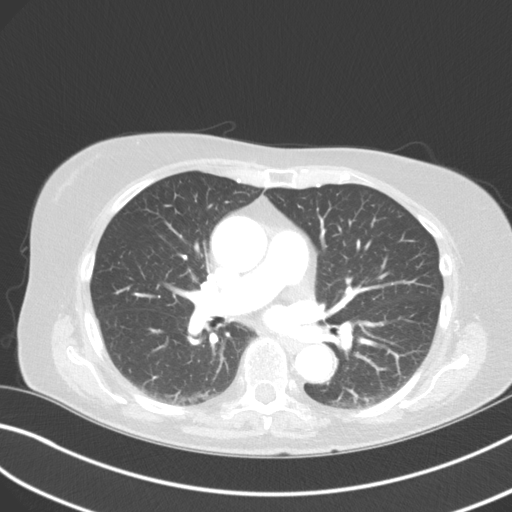
[im 64/102  soft-tissue]
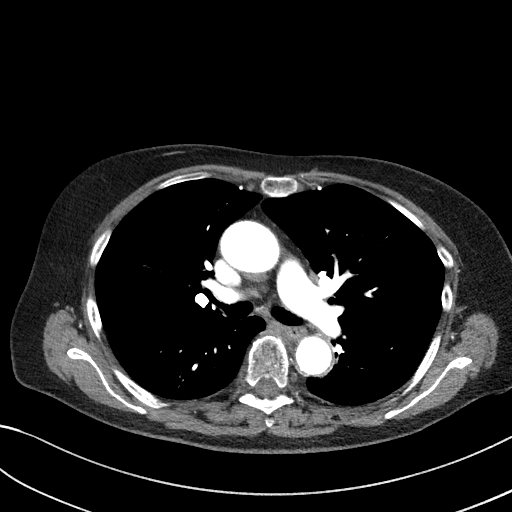
[im 72/102  lung]
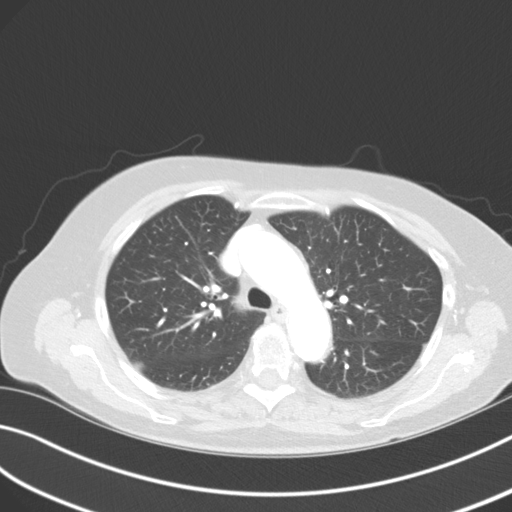
[im 75/102  soft-tissue]
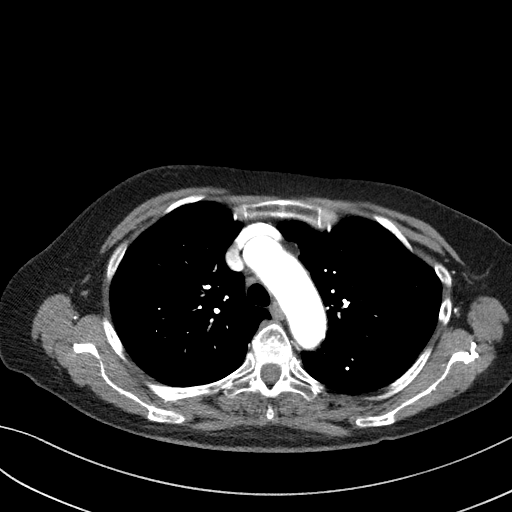
[im 83/102  lung]
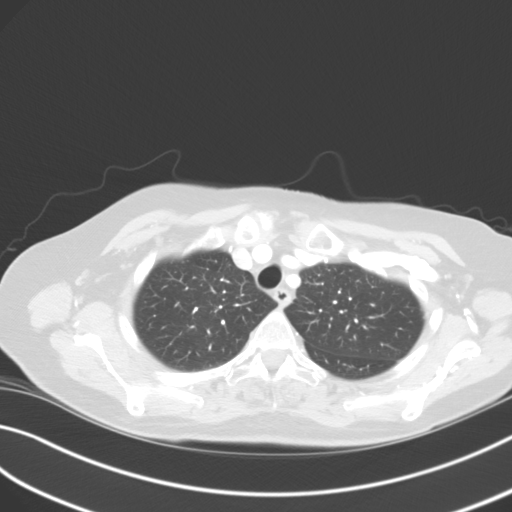
[im 90/102  soft-tissue]
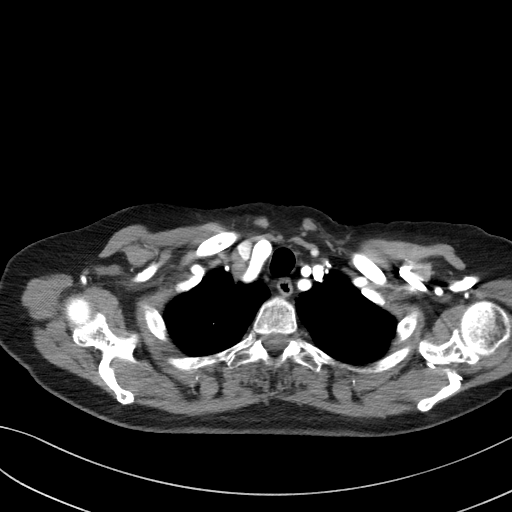
[im 98/102  lung]
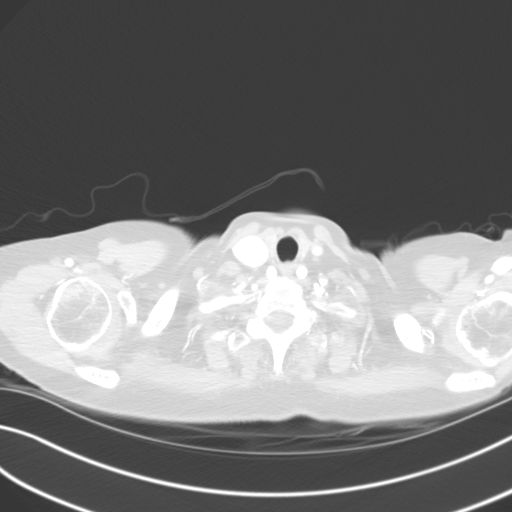

[Series 7: coronals · coronal · 0.63mm/px · 3 of 112 slices shown]
[im 28/112  soft-tissue]
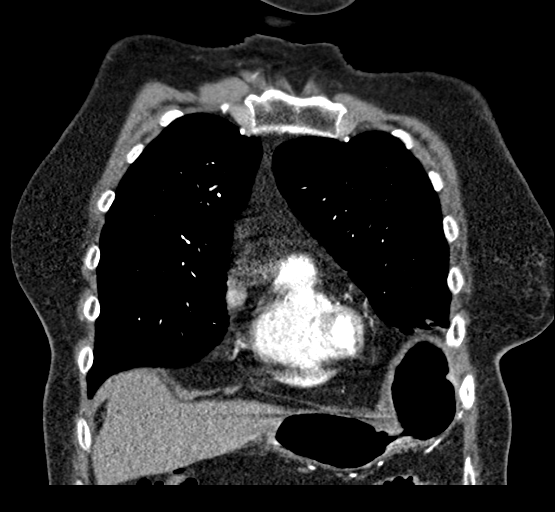
[im 56/112  soft-tissue]
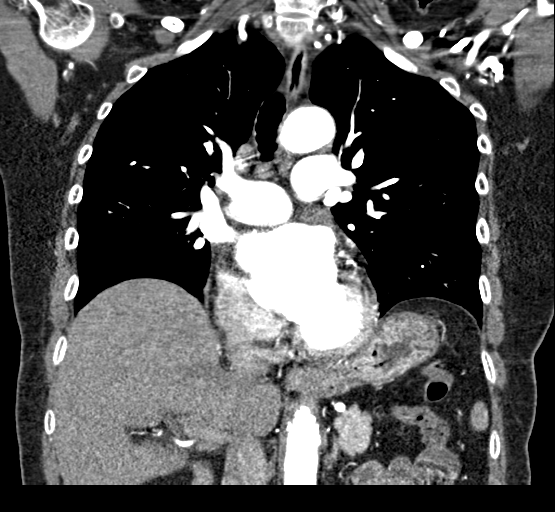
[im 84/112  soft-tissue]
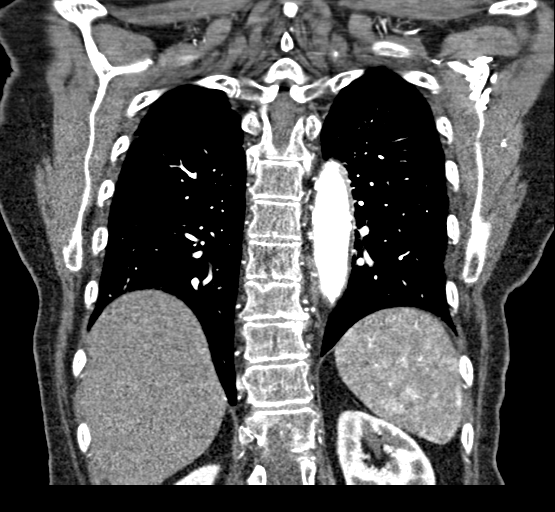

[18 of 46 positions shown; findings below may reference images not displayed]

FINDINGS: Cardiovascular: The ascending thoracic aorta has a maximum
transverse diameter of 4.1 x 3.8 cm, essentially stable compared to
previous study. There is no thoracic aortic dissection. The
visualized great vessels appear unremarkable. There are foci of
coronary artery calcification evident. Pericardium is not thickened.
There is no demonstrable pulmonary embolus.

Mediastinum/Nodes: Thyroid appears unremarkable. There is no
appreciable thoracic adenopathy. There are a few scattered
subcentimeter mediastinal lymph nodes which do not meet size
criteria for pathologic significance.

Lungs/Pleura: There is a calcified granuloma in the superior segment
right lower lobe, stable. There is mild bibasilar atelectasis. There
is scarring in the inferior lingula. There is no edema or
consolidation.

Upper Abdomen: In the visualized upper abdomen, there is
atherosclerotic calcification in the upper abdominal aorta.
Visualized upper abdominal structures otherwise appear unremarkable.

Musculoskeletal: There is degenerative change in the thoracic spine.
There are no blastic or lytic bone lesions.

Review of the MIP images confirms the above findings.
IMPRESSION: Prominence in the ascending thoracic aorta with a maximum transverse
diameter of 4.1 x 3.8 cm, essentially stable from prior study.
Recommend annual imaging followup by CTA or MRA. This recommendation
follows 5111 ACCF/AHA/AATS/ACR/ASA/SCA/ELDRED/KUZU/EBO/YINET Guidelines
for the Diagnosis and Management of Patients with Thoracic Aortic
Disease. Circulation. 5111; 121: [id number]

No thoracic aortic dissection. No demonstrable pulmonary embolus.
There are foci of coronary artery calcification evident.

No edema or consolidation. Bibasilar atelectasis. Calcified
granuloma in the superior segment right lower lobe. No adenopathy by
size criteria.

## 2017-03-17 DIAGNOSIS — Z96652 Presence of left artificial knee joint: Secondary | ICD-10-CM | POA: Diagnosis not present

## 2017-03-17 DIAGNOSIS — H6592 Unspecified nonsuppurative otitis media, left ear: Secondary | ICD-10-CM | POA: Diagnosis not present

## 2017-03-17 DIAGNOSIS — Z6821 Body mass index (BMI) 21.0-21.9, adult: Secondary | ICD-10-CM | POA: Diagnosis not present

## 2017-04-05 NOTE — Progress Notes (Signed)
HPI: FU atrial fibrillation.Admitted December 2015 with a syncopal episode.She was found to be in AFib with RVR. EF was noted to be 40-45% (possibly related to tachycardia). It was questioned whether or not her syncope may be related to post-termination pauses. She was placed on beta blocker. Long term anticoagulation with Xarelto was recommended. Her Chadsvasc score is 5. She subsequently underwent a 30 day event monitor which showed no arrhythmias. Lexiscan nuclear study 1/16 showed no ischemia and her ejection fraction had improved to 70%. CTA September 2018 showed ectatic ascending thoracic aorta measuring 3.7 cm. Since last seen,  patient denies dyspnea, chest pain or syncope.  She thinks she may have had one episode of palpitations.  Current Outpatient Medications  Medication Sig Dispense Refill  . acetaminophen (TYLENOL) 500 MG tablet Take 1,000 mg by mouth every 6 (six) hours as needed for moderate pain or fever (fever).     . Apple Cider Vinegar 600 MG CAPS Take 600 mg by mouth daily.    Marland Kitchen atorvastatin (LIPITOR) 20 MG tablet Take 20 mg by mouth every evening.    . Calcium Carb-Cholecalciferol (CALCIUM 1000 + D PO) Take 1 tablet by mouth daily.     . cholecalciferol (VITAMIN D) 1000 units tablet Take 1 capsule by mouth daily.    . Cinnamon 500 MG capsule Take 500 mg by mouth daily.    . Flaxseed, Linseed, (FLAX SEED OIL) 1000 MG CAPS Take 1,000 mg by mouth daily.    Marland Kitchen levothyroxine (SYNTHROID, LEVOTHROID) 100 MCG tablet Take 100 mcg by mouth daily before breakfast.    . metoprolol succinate (TOPROL-XL) 25 MG 24 hr tablet Take 12.5 mg by mouth as directed. 1/2 tablet daily    . Multiple Vitamins-Minerals (MULTIVITAMIN WITH MINERALS) tablet Take 1 tablet by mouth daily.    . ranitidine (ZANTAC) 150 MG tablet Take 150 mg by mouth daily.    . rivaroxaban (XARELTO) 20 MG TABS tablet Take 1 tablet (20 mg total) by mouth daily with supper. 30 tablet 5  . traZODone (DESYREL) 50 MG  tablet Take 50 mg by mouth at bedtime.    . vitamin C (ASCORBIC ACID) 500 MG tablet Take 500 mg by mouth daily.      No current facility-administered medications for this visit.      Past Medical History:  Diagnosis Date  . Ascending aortic aneurysm (HCC)    3.8 x 4 cm on CTA 12/2013  . CAD (coronary artery disease)    a. Chest CTA 01/16/14:  IMPRESSION: 1. Negative for pulmonary embolus or acute vascular abnormality. 2. Atherosclerosis and coronary artery disease. 3. Unchanged ascending aortic ectasia measuring 38 mm x 40 mm.  Recommend annual imaging followup by CTA or MRA  . Cardiomyopathy [facility])    a.  Echocardiogram 12/3013:  Left ventricle: Systolic function was mildly to moderately reduced. The estimated ejection fraction was in the range of 40% to 45%. Regional wall motion abnormalities cannot be excluded.  The study is not technically sufficient to allow evaluation of LV diastolic function.  . DVT (deep venous thrombosis) ([place])   . History of anemia   . Hx of cardiovascular stress test    Lexiscan Myoview (01/2014):  No ischemia or scar, EF 70%; Normal Study/Low Risk  . Hypercholesteremia   . Hypothyroidism   . Left knee DJD   . PAF (paroxysmal atrial fibrillation) ([place])   . PE (pulmonary embolism)   . Pulmonary embolism ([place])   . Raynaud  disease     Past Surgical History:  Procedure Laterality Date  . ABDOMINAL HYSTERECTOMY  1973  . FROZEN SHOULDER    . KNEE ARTHROSCOPY Left   . SHOULDER ADHESION RELEASE Left 2000  . TONSILLECTOMY  1961  . TOTAL KNEE ARTHROPLASTY Left 12/25/2012   Procedure: TOTAL KNEE ARTHROPLASTY- left;  Surgeon: Lorn Junes, MD;  Location: [place];  Service: Orthopedics;  Laterality: Left;    Social History   Socioeconomic History  . Marital status: Widowed    Spouse name: Not on file  . Number of children: Not on file  . Years of education: Not on file  . Highest education level: Not on file  Occupational History  . Not on file  Social  Needs  . Financial resource strain: Not on file  . Food insecurity:    Worry: Not on file    Inability: Not on file  . Transportation needs:    Medical: Not on file    Non-medical: Not on file  Tobacco Use  . Smoking status: Never Smoker  . Smokeless tobacco: Never Used  Substance and Sexual Activity  . Alcohol use: No    Comment: Rare glass of wine  . Drug use: No  . Sexual activity: Not on file  Lifestyle  . Physical activity:    Days per week: Not on file    Minutes per session: Not on file  . Stress: Not on file  Relationships  . Social connections:    Talks on phone: Not on file    Gets together: Not on file    Attends religious service: Not on file    Active member of club or organization: Not on file    Attends meetings of clubs or organizations: Not on file    Relationship status: Not on file  . Intimate partner violence:    Fear of current or ex partner: Not on file    Emotionally abused: Not on file    Physically abused: Not on file    Forced sexual activity: Not on file  Other Topics Concern  . Not on file  Social History Narrative  . Not on file    Family History  Problem Relation Age of Onset  . Cancer - Other Mother   . Diabetes Father   . Melanoma Sister   . Heart disease Sister 41       CABG  . Heart attack Sister   . Stroke Neg Hx   . Hypertension Neg Hx     ROS: no fevers or chills, productive cough, hemoptysis, dysphasia, odynophagia, melena, hematochezia, dysuria, hematuria, rash, seizure activity, orthopnea, PND, pedal edema, claudication. Remaining systems are negative.  Physical Exam: Well-developed well-nourished in no acute distress.  Skin is warm and dry.  HEENT is normal.  Neck is supple.  Chest is clear to auscultation with normal expansion.  Cardiovascular exam is regular rate and rhythm.  Abdominal exam nontender or distended. No masses palpated. Extremities show no edema. neuro grossly intact  ECG-normal sinus rhythm at a  rate of 68.  Nonspecific ST changes.  Personally reviewed  A/P  1 paroxysmal atrial fibrillation-patient is in sinus rhythm this morning.  We will continue with present dose of Toprol for rate control if atrial fibrillation recurs.  Continue Xarelto.  Hemoglobin and renal function monitored by primary care.  2 hyperlipidemia-continue statin.  3 history of pulmonary embolus-patient will continue anticoagulation both for prior history of pulmonary embolus and paroxysmal atrial fibrillation.  4 history of thoracic aortic aneurysm-last CTA showed maximum dimension of 3.7 cm.  No plans for further evaluation at this point.  Kirk Ruths, MD

## 2017-04-14 DIAGNOSIS — M8580 Other specified disorders of bone density and structure, unspecified site: Secondary | ICD-10-CM | POA: Diagnosis not present

## 2017-04-14 DIAGNOSIS — E785 Hyperlipidemia, unspecified: Secondary | ICD-10-CM | POA: Diagnosis not present

## 2017-04-14 DIAGNOSIS — E039 Hypothyroidism, unspecified: Secondary | ICD-10-CM | POA: Diagnosis not present

## 2017-04-14 DIAGNOSIS — R82998 Other abnormal findings in urine: Secondary | ICD-10-CM | POA: Diagnosis not present

## 2017-04-14 DIAGNOSIS — M859 Disorder of bone density and structure, unspecified: Secondary | ICD-10-CM | POA: Diagnosis not present

## 2017-04-15 ENCOUNTER — Ambulatory Visit: Payer: Medicare HMO | Admitting: Cardiology

## 2017-04-15 ENCOUNTER — Encounter: Payer: Self-pay | Admitting: Cardiology

## 2017-04-15 VITALS — BP 126/75 | HR 68 | Ht 70.0 in | Wt 142.4 lb

## 2017-04-15 DIAGNOSIS — E7800 Pure hypercholesterolemia, unspecified: Secondary | ICD-10-CM | POA: Diagnosis not present

## 2017-04-15 DIAGNOSIS — I480 Paroxysmal atrial fibrillation: Secondary | ICD-10-CM | POA: Diagnosis not present

## 2017-04-15 NOTE — Patient Instructions (Signed)

## 2017-04-18 DIAGNOSIS — H40013 Open angle with borderline findings, low risk, bilateral: Secondary | ICD-10-CM | POA: Diagnosis not present

## 2017-04-21 DIAGNOSIS — I480 Paroxysmal atrial fibrillation: Secondary | ICD-10-CM | POA: Diagnosis not present

## 2017-04-21 DIAGNOSIS — Z0000 Encounter for general adult medical examination without abnormal findings: Secondary | ICD-10-CM | POA: Diagnosis not present

## 2017-04-21 DIAGNOSIS — F39 Unspecified mood [affective] disorder: Secondary | ICD-10-CM | POA: Diagnosis not present

## 2017-04-21 DIAGNOSIS — Z7901 Long term (current) use of anticoagulants: Secondary | ICD-10-CM | POA: Diagnosis not present

## 2017-04-21 DIAGNOSIS — I25119 Atherosclerotic heart disease of native coronary artery with unspecified angina pectoris: Secondary | ICD-10-CM | POA: Diagnosis not present

## 2017-04-21 DIAGNOSIS — I7300 Raynaud's syndrome without gangrene: Secondary | ICD-10-CM | POA: Diagnosis not present

## 2017-04-21 DIAGNOSIS — I712 Thoracic aortic aneurysm, without rupture: Secondary | ICD-10-CM | POA: Diagnosis not present

## 2017-04-21 DIAGNOSIS — E785 Hyperlipidemia, unspecified: Secondary | ICD-10-CM | POA: Diagnosis not present

## 2017-04-21 DIAGNOSIS — I429 Cardiomyopathy, unspecified: Secondary | ICD-10-CM | POA: Diagnosis not present

## 2017-04-21 DIAGNOSIS — I119 Hypertensive heart disease without heart failure: Secondary | ICD-10-CM | POA: Diagnosis not present

## 2017-04-28 DIAGNOSIS — M79672 Pain in left foot: Secondary | ICD-10-CM | POA: Diagnosis not present

## 2017-04-28 DIAGNOSIS — R609 Edema, unspecified: Secondary | ICD-10-CM | POA: Diagnosis not present

## 2017-04-28 DIAGNOSIS — Z6822 Body mass index (BMI) 22.0-22.9, adult: Secondary | ICD-10-CM | POA: Diagnosis not present

## 2017-05-03 ENCOUNTER — Ambulatory Visit (INDEPENDENT_AMBULATORY_CARE_PROVIDER_SITE_OTHER): Payer: Medicare HMO | Admitting: Orthopaedic Surgery

## 2017-05-03 ENCOUNTER — Ambulatory Visit (INDEPENDENT_AMBULATORY_CARE_PROVIDER_SITE_OTHER): Payer: Medicare HMO

## 2017-05-03 VITALS — Ht 69.0 in | Wt 147.0 lb

## 2017-05-03 DIAGNOSIS — M79672 Pain in left foot: Secondary | ICD-10-CM

## 2017-05-03 MED ORDER — DICLOFENAC SODIUM 1 % TD GEL: 2.0000 g | Freq: Four times a day (QID) | TRANSDERMAL | 5 refills | Status: DC

## 2017-05-03 NOTE — Progress Notes (Signed)
Office Visit Note   Patient: Whitney Medina           Date of Birth: September 19, 1932           MRN: [id number] Visit Date: 05/03/2017              Requested by: Prince Solian, MD [address] [place], [place] [address] PCP: Prince Solian, MD   Assessment & Plan: Visit Diagnoses:  1. Pain in left foot     Plan: Impression is left ankle pain.  Etiology is not quite clear.  I recommend ASO brace for support.  Recommend continue symptomatic treatment.  Prescription for Voltaren gel.  Ice and elevate and heat as needed.  Follow-up as needed.  Follow-Up Instructions: Return if symptoms worsen or fail to improve.   Orders:  Orders Placed This Encounter  Procedures  . XR Foot Complete Left   Meds ordered this encounter  Medications  . diclofenac sodium (VOLTAREN) 1 % GEL    Sig: Apply 2 g topically 4 (four) times daily.    Dispense:  1 Tube    Refill:  5      Procedures: No procedures performed   Clinical Data: No additional findings.   Subjective: Chief Complaint  Patient presents with  . Left Foot - Pain    Patient is a 84 year old female comes in with left foot and ankle pain for about 2 weeks.  This is gotten better.  She does notice some redness and swelling but she denies any injuries.  She has not taken any pain medicines but she has been icing elevating and.  Her pain is on the anterolateral aspect of the ankle.  Denies any numbness and tingling.   Review of Systems  Constitutional: Negative.   HENT: Negative.   Eyes: Negative.   Respiratory: Negative.   Cardiovascular: Negative.   Endocrine: Negative.   Musculoskeletal: Negative.   Neurological: Negative.   Hematological: Negative.   Psychiatric/Behavioral: Negative.   All other systems reviewed and are negative.    Objective: Vital Signs: Ht 5\' 9"  (1.753 m)   Wt 147 lb (66.7 kg)   BMI 21.71 kg/m   Physical Exam  Constitutional: She is oriented to person, place, and time. She appears  well-developed and well-nourished.  HENT:  Head: Normocephalic and atraumatic.  Eyes: EOM are normal.  Neck: Neck supple.  Pulmonary/Chest: Effort normal.  Abdominal: Soft.  Neurological: She is alert and oriented to person, place, and time.  Skin: Skin is warm. Capillary refill takes less than 2 seconds.  Psychiatric: She has a normal mood and affect. Her behavior is normal. Judgment and thought content normal.  Nursing note and vitals reviewed.   Ortho Exam Left ankle exam shows moderate swelling of the foot.  No neurovascular compromise.  She does have tenderness in the region of the ATFL ligament.  No soft tissue crepitus.  No pain with ankle or subtalar range of motion.  No pain with toe range of motion. Specialty Comments:  No specialty comments available.  Imaging: Xr Foot Complete Left  Result Date: 05/03/2017 No acute or structural abnormalities.  Mild diffuse osteopenia    PMFS History: Patient Active Problem List   Diagnosis Date Noted  . Ataxia 02/13/2015  . Ascending aortic aneurysm ([place]) 02/06/2014  . E-coli UTI 01/18/2014  . Atrial fibrillation with RVR ([place]) 01/17/2014  . Syncope 01/16/2014  . Atrial fibrillation ([place]) 01/16/2014  . Acute respiratory failure with hypoxia ([place]) 01/16/2014  . Lung granuloma (  [place]) 01/16/2014  . Pulmonary embolism ([place]) 02/14/2013  . Hypothyroidism   . Hypercholesteremia    Past Medical History:  Diagnosis Date  . Ascending aortic aneurysm (HCC)    3.8 x 4 cm on CTA 12/2013  . CAD (coronary artery disease)    a. Chest CTA 01/16/14:  IMPRESSION: 1. Negative for pulmonary embolus or acute vascular abnormality. 2. Atherosclerosis and coronary artery disease. 3. Unchanged ascending aortic ectasia measuring 38 mm x 40 mm.  Recommend annual imaging followup by CTA or MRA  . Cardiomyopathy [facility])    a.  Echocardiogram 12/3013:  Left ventricle: Systolic function was mildly to moderately reduced. The estimated ejection fraction was in  the range of 40% to 45%. Regional wall motion abnormalities cannot be excluded.  The study is not technically sufficient to allow evaluation of LV diastolic function.  . DVT (deep venous thrombosis) ([place])   . History of anemia   . Hx of cardiovascular stress test    Lexiscan Myoview (01/2014):  No ischemia or scar, EF 70%; Normal Study/Low Risk  . Hypercholesteremia   . Hypothyroidism   . Left knee DJD   . PAF (paroxysmal atrial fibrillation) ([place])   . PE (pulmonary embolism)   . Pulmonary embolism ([place])   . Raynaud disease     Family History  Problem Relation Age of Onset  . Cancer - Other Mother   . Diabetes Father   . Melanoma Sister   . Heart disease Sister 61       CABG  . Heart attack Sister   . Stroke Neg Hx   . Hypertension Neg Hx     Past Surgical History:  Procedure Laterality Date  . ABDOMINAL HYSTERECTOMY  1973  . FROZEN SHOULDER    . KNEE ARTHROSCOPY Left   . SHOULDER ADHESION RELEASE Left 2000  . TONSILLECTOMY  1961  . TOTAL KNEE ARTHROPLASTY Left 12/25/2012   Procedure: TOTAL KNEE ARTHROPLASTY- left;  Surgeon: Lorn Junes, MD;  Location: [facility];  Service: Orthopedics;  Laterality: Left;   Social History   Occupational History  . Not on file  Tobacco Use  . Smoking status: Never Smoker  . Smokeless tobacco: Never Used  Substance and Sexual Activity  . Alcohol use: No    Comment: Rare glass of wine  . Drug use: No  . Sexual activity: Not on file

## 2017-08-25 ENCOUNTER — Encounter: Payer: Self-pay | Admitting: Cardiology

## 2017-10-12 ENCOUNTER — Other Ambulatory Visit: Payer: Self-pay | Admitting: Internal Medicine

## 2017-10-12 DIAGNOSIS — Z1231 Encounter for screening mammogram for malignant neoplasm of breast: Secondary | ICD-10-CM

## 2017-10-15 DIAGNOSIS — Z23 Encounter for immunization: Secondary | ICD-10-CM | POA: Diagnosis not present

## 2017-11-08 ENCOUNTER — Ambulatory Visit
Admission: RE | Admit: 2017-11-08 | Discharge: 2017-11-08 | Disposition: A | Discharge status: Home / Self Care | Payer: Medicare HMO | Source: Ambulatory Visit | Attending: Internal Medicine | Admitting: Internal Medicine

## 2017-11-08 DIAGNOSIS — Z1231 Encounter for screening mammogram for malignant neoplasm of breast: Secondary | ICD-10-CM | POA: Diagnosis not present

## 2017-12-07 ENCOUNTER — Telehealth: Payer: Self-pay | Admitting: *Deleted

## 2017-12-07 DIAGNOSIS — H40013 Open angle with borderline findings, low risk, bilateral: Secondary | ICD-10-CM | POA: Diagnosis not present

## 2017-12-07 NOTE — Telephone Encounter (Signed)
   [facility] Pre-operative Risk Assessment    Request for surgical clearance:  1. What type of surgery is being performed? Tooth extract   2. When is this surgery scheduled? pending   3. What type of clearance is required (medical clearance vs. Pharmacy clearance to hold med vs. Both)? Medical  4. Are there any medications that need to be held prior to surgery and how long?Xarelto   5. Practice name and name of physician performing surgery? [facility] D[facility]   6. What is your office phone number [phone]    7.   What is your office fax number [phone]  8.   Anesthesia type (None, local, MAC, general) ?       Whitney Medina 12/07/2017, 3:20 PM  _________________________________________________________________   (provider comments below)

## 2017-12-08 DIAGNOSIS — L600 Ingrowing nail: Secondary | ICD-10-CM | POA: Diagnosis not present

## 2017-12-08 DIAGNOSIS — M25775 Osteophyte, left foot: Secondary | ICD-10-CM | POA: Diagnosis not present

## 2017-12-08 DIAGNOSIS — M25774 Osteophyte, right foot: Secondary | ICD-10-CM | POA: Diagnosis not present

## 2017-12-08 DIAGNOSIS — M79675 Pain in left toe(s): Secondary | ICD-10-CM | POA: Diagnosis not present

## 2017-12-08 DIAGNOSIS — B351 Tinea unguium: Secondary | ICD-10-CM | POA: Diagnosis not present

## 2017-12-08 DIAGNOSIS — M79674 Pain in right toe(s): Secondary | ICD-10-CM | POA: Diagnosis not present

## 2017-12-08 NOTE — Telephone Encounter (Signed)
   Primary Cardiologist: Kirk Ruths, MD  Chart reviewed as part of pre-operative protocol coverage. Simple dental extractions are considered low risk procedures per guidelines and generally do not require any specific cardiac clearance. It is also generally accepted that for simple extractions and dental cleanings, there is no need to interrupt blood thinner therapy.   SBE prophylaxis is not required for the patient from a cardiac standpoint.   I will route this recommendation to the requesting party via Epic fax function and remove from pre-op pool.  Please call with questions.  Daune Perch, NP 12/08/2017, 4:48 PM

## 2017-12-22 DIAGNOSIS — I480 Paroxysmal atrial fibrillation: Secondary | ICD-10-CM | POA: Diagnosis not present

## 2017-12-22 DIAGNOSIS — Z7901 Long term (current) use of anticoagulants: Secondary | ICD-10-CM | POA: Diagnosis not present

## 2017-12-22 DIAGNOSIS — R5383 Other fatigue: Secondary | ICD-10-CM | POA: Diagnosis not present

## 2017-12-22 DIAGNOSIS — Z6821 Body mass index (BMI) 21.0-21.9, adult: Secondary | ICD-10-CM | POA: Diagnosis not present

## 2017-12-22 DIAGNOSIS — Z79899 Other long term (current) drug therapy: Secondary | ICD-10-CM | POA: Diagnosis not present

## 2017-12-22 DIAGNOSIS — B351 Tinea unguium: Secondary | ICD-10-CM | POA: Diagnosis not present

## 2017-12-29 DIAGNOSIS — M79675 Pain in left toe(s): Secondary | ICD-10-CM | POA: Diagnosis not present

## 2017-12-29 DIAGNOSIS — B351 Tinea unguium: Secondary | ICD-10-CM | POA: Diagnosis not present

## 2017-12-29 DIAGNOSIS — L600 Ingrowing nail: Secondary | ICD-10-CM | POA: Diagnosis not present

## 2017-12-29 DIAGNOSIS — M79674 Pain in right toe(s): Secondary | ICD-10-CM | POA: Diagnosis not present

## 2018-02-06 DIAGNOSIS — R69 Illness, unspecified: Secondary | ICD-10-CM | POA: Diagnosis not present

## 2018-02-08 DIAGNOSIS — L821 Other seborrheic keratosis: Secondary | ICD-10-CM | POA: Diagnosis not present

## 2018-03-06 DIAGNOSIS — L600 Ingrowing nail: Secondary | ICD-10-CM | POA: Diagnosis not present

## 2018-04-10 DIAGNOSIS — Z7901 Long term (current) use of anticoagulants: Secondary | ICD-10-CM | POA: Diagnosis not present

## 2018-04-10 DIAGNOSIS — J069 Acute upper respiratory infection, unspecified: Secondary | ICD-10-CM | POA: Diagnosis not present

## 2018-04-10 DIAGNOSIS — R05 Cough: Secondary | ICD-10-CM | POA: Diagnosis not present

## 2018-04-10 DIAGNOSIS — I480 Paroxysmal atrial fibrillation: Secondary | ICD-10-CM | POA: Diagnosis not present

## 2018-04-18 ENCOUNTER — Encounter: Payer: Self-pay | Admitting: *Deleted

## 2018-04-18 NOTE — Telephone Encounter (Signed)
Left message for pt to call to discuss 05-02-2018 appointment

## 2018-04-27 ENCOUNTER — Telehealth: Payer: Self-pay

## 2018-04-27 NOTE — Telephone Encounter (Signed)
Called and spoke with the patient about her upcoming appointment and switching it to a virtual visit. Explained to the patient that a virtual visit consist of a video call or a telephone call with Dr. Stanford Breed at the same date and time. Patient stated that she would just rather cancel the appointment until all of the COVID-19 stuff is over with. I asked the patient   Cardiac Questionnaire:    Since your last visit or hospitalization:    1. Have you been having new or worsening chest pain? No   2. Have you been having new or worsening shortness of breath? No 3. Have you been having new or worsening leg swelling, wt gain, or increase in abdominal girth (pants fitting more tightly)? No   4. Have you had any passing out spells? No   Patient was informed that if she was to have any new or worsening chest pains and/or any of the other cardiac symptoms to call 911 if they are unbearable and our office if new

## 2018-05-02 ENCOUNTER — Ambulatory Visit: Payer: Medicare HMO | Admitting: Cardiology

## 2018-05-03 NOTE — Telephone Encounter (Signed)
This encounter was created in error - please disregard.

## 2018-05-05 DIAGNOSIS — E7849 Other hyperlipidemia: Secondary | ICD-10-CM | POA: Diagnosis not present

## 2018-05-05 DIAGNOSIS — E038 Other specified hypothyroidism: Secondary | ICD-10-CM | POA: Diagnosis not present

## 2018-05-12 DIAGNOSIS — I25119 Atherosclerotic heart disease of native coronary artery with unspecified angina pectoris: Secondary | ICD-10-CM | POA: Diagnosis not present

## 2018-05-12 DIAGNOSIS — F39 Unspecified mood [affective] disorder: Secondary | ICD-10-CM | POA: Diagnosis not present

## 2018-05-12 DIAGNOSIS — Z0000 Encounter for general adult medical examination without abnormal findings: Secondary | ICD-10-CM | POA: Diagnosis not present

## 2018-05-12 DIAGNOSIS — Z7901 Long term (current) use of anticoagulants: Secondary | ICD-10-CM | POA: Diagnosis not present

## 2018-05-12 DIAGNOSIS — I480 Paroxysmal atrial fibrillation: Secondary | ICD-10-CM | POA: Diagnosis not present

## 2018-05-12 DIAGNOSIS — E785 Hyperlipidemia, unspecified: Secondary | ICD-10-CM | POA: Diagnosis not present

## 2018-05-12 DIAGNOSIS — I712 Thoracic aortic aneurysm, without rupture: Secondary | ICD-10-CM | POA: Diagnosis not present

## 2018-05-12 DIAGNOSIS — I7300 Raynaud's syndrome without gangrene: Secondary | ICD-10-CM | POA: Diagnosis not present

## 2018-05-12 DIAGNOSIS — I119 Hypertensive heart disease without heart failure: Secondary | ICD-10-CM | POA: Diagnosis not present

## 2018-05-12 DIAGNOSIS — I429 Cardiomyopathy, unspecified: Secondary | ICD-10-CM | POA: Diagnosis not present

## 2018-08-23 ENCOUNTER — Encounter: Payer: Self-pay | Admitting: *Deleted

## 2018-08-30 ENCOUNTER — Ambulatory Visit: Payer: Medicare HMO | Admitting: Cardiology

## 2018-09-04 DIAGNOSIS — M8589 Other specified disorders of bone density and structure, multiple sites: Secondary | ICD-10-CM | POA: Diagnosis not present

## 2018-10-12 DIAGNOSIS — Z23 Encounter for immunization: Secondary | ICD-10-CM | POA: Diagnosis not present

## 2018-11-06 ENCOUNTER — Other Ambulatory Visit: Payer: Self-pay | Admitting: Internal Medicine

## 2018-11-06 DIAGNOSIS — Z1231 Encounter for screening mammogram for malignant neoplasm of breast: Secondary | ICD-10-CM

## 2018-11-07 DIAGNOSIS — M21961 Unspecified acquired deformity of right lower leg: Secondary | ICD-10-CM | POA: Diagnosis not present

## 2018-11-07 DIAGNOSIS — M7671 Peroneal tendinitis, right leg: Secondary | ICD-10-CM | POA: Diagnosis not present

## 2018-11-09 DIAGNOSIS — H25013 Cortical age-related cataract, bilateral: Secondary | ICD-10-CM | POA: Diagnosis not present

## 2018-11-09 DIAGNOSIS — H43813 Vitreous degeneration, bilateral: Secondary | ICD-10-CM | POA: Diagnosis not present

## 2018-11-09 DIAGNOSIS — H40013 Open angle with borderline findings, low risk, bilateral: Secondary | ICD-10-CM | POA: Diagnosis not present

## 2018-11-09 DIAGNOSIS — H2513 Age-related nuclear cataract, bilateral: Secondary | ICD-10-CM | POA: Diagnosis not present

## 2018-11-20 NOTE — Progress Notes (Signed)
HPI: FU atrial fibrillation.Admitted December 2015 with a syncopal episode.She was found to be in AFib with RVR. EF was noted to be 40-45% (possibly related to tachycardia). It was questioned whether or not her syncope may be related to post-termination pauses. She was placed on beta blocker. Long term anticoagulation with Xarelto was recommended. Her Chadsvasc score is 5. She subsequently underwent a 30 day event monitor which showed no arrhythmias. Lexiscan nuclear study 1/16 showed no ischemia and her ejection fraction had improved to 70%. CTA September 2018showed ectatic ascending thoracic aorta measuring 3.7 cm. Since last seen, the patient denies any dyspnea on exertion, orthopnea, PND, pedal edema, palpitations, syncope or chest pain.   Current Outpatient Medications  Medication Sig Dispense Refill  . acetaminophen (TYLENOL) 500 MG tablet Take 1,000 mg by mouth every 6 (six) hours as needed for moderate pain or fever (fever).     . Apple Cider Vinegar 600 MG CAPS Take 600 mg by mouth daily.    Marland Kitchen atorvastatin (LIPITOR) 20 MG tablet Take 20 mg by mouth every evening.    . Calcium Carb-Cholecalciferol (CALCIUM 1000 + D PO) Take 1 tablet by mouth daily.     . cholecalciferol (VITAMIN D) 1000 units tablet Take 1 capsule by mouth daily.    . Cinnamon 500 MG capsule Take 500 mg by mouth daily.    . diclofenac sodium (VOLTAREN) 1 % GEL Apply 2 g topically 4 (four) times daily. 1 Tube 5  . Flaxseed, Linseed, (FLAX SEED OIL) 1000 MG CAPS Take 1,000 mg by mouth daily.    Marland Kitchen levothyroxine (SYNTHROID, LEVOTHROID) 100 MCG tablet Take 100 mcg by mouth daily before breakfast.    . metoprolol succinate (TOPROL-XL) 25 MG 24 hr tablet Take 12.5 mg by mouth as directed. 1/2 tablet daily    . Multiple Vitamins-Minerals (MULTIVITAMIN WITH MINERALS) tablet Take 1 tablet by mouth daily.    . ranitidine (ZANTAC) 150 MG tablet Take 150 mg by mouth daily.    . rivaroxaban (XARELTO) 20 MG TABS tablet  Take 1 tablet (20 mg total) by mouth daily with supper. 30 tablet 5  . traZODone (DESYREL) 50 MG tablet Take 50 mg by mouth at bedtime.    . vitamin C (ASCORBIC ACID) 500 MG tablet Take 500 mg by mouth daily.      No current facility-administered medications for this visit.      Past Medical History:  Diagnosis Date  . Ascending aortic aneurysm (HCC)    3.8 x 4 cm on CTA 12/2013  . CAD (coronary artery disease)    a. Chest CTA 01/16/14:  IMPRESSION: 1. Negative for pulmonary embolus or acute vascular abnormality. 2. Atherosclerosis and coronary artery disease. 3. Unchanged ascending aortic ectasia measuring 38 mm x 40 mm.  Recommend annual imaging followup by CTA or MRA  . Cardiomyopathy [facility])    a.  Echocardiogram 12/3013:  Left ventricle: Systolic function was mildly to moderately reduced. The estimated ejection fraction was in the range of 40% to 45%. Regional wall motion abnormalities cannot be excluded.  The study is not technically sufficient to allow evaluation of LV diastolic function.  . DVT (deep venous thrombosis) ([place])   . History of anemia   . Hx of cardiovascular stress test    Lexiscan Myoview (01/2014):  No ischemia or scar, EF 70%; Normal Study/Low Risk  . Hypercholesteremia   . Hypothyroidism   . Left knee DJD   . PAF (paroxysmal atrial fibrillation) ([place])   .  PE (pulmonary embolism)   . Pulmonary embolism ([place])   . Raynaud disease     Past Surgical History:  Procedure Laterality Date  . ABDOMINAL HYSTERECTOMY  1973  . FROZEN SHOULDER    . KNEE ARTHROSCOPY Left   . SHOULDER ADHESION RELEASE Left 2000  . TONSILLECTOMY  1961  . TOTAL KNEE ARTHROPLASTY Left 12/25/2012   Procedure: TOTAL KNEE ARTHROPLASTY- left;  Surgeon: Lorn Junes, MD;  Location: [facility];  Service: Orthopedics;  Laterality: Left;    Social History   Socioeconomic History  . Marital status: Widowed    Spouse name: Not on file  . Number of children: Not on file  . Years of education: Not  on file  . Highest education level: Not on file  Occupational History  . Not on file  Social Needs  . Financial resource strain: Not on file  . Food insecurity    Worry: Not on file    Inability: Not on file  . Transportation needs    Medical: Not on file    Non-medical: Not on file  Tobacco Use  . Smoking status: Never Smoker  . Smokeless tobacco: Never Used  Substance and Sexual Activity  . Alcohol use: No    Comment: Rare glass of wine  . Drug use: No  . Sexual activity: Not on file  Lifestyle  . Physical activity    Days per week: Not on file    Minutes per session: Not on file  . Stress: Not on file  Relationships  . Social Herbalist on phone: Not on file    Gets together: Not on file    Attends religious service: Not on file    Active member of club or organization: Not on file    Attends meetings of clubs or organizations: Not on file    Relationship status: Not on file  . Intimate partner violence    Fear of current or ex partner: Not on file    Emotionally abused: Not on file    Physically abused: Not on file    Forced sexual activity: Not on file  Other Topics Concern  . Not on file  Social History Narrative  . Not on file    Family History  Problem Relation Age of Onset  . Cancer - Other Mother   . Diabetes Father   . Melanoma Sister   . Heart disease Sister 63       CABG  . Heart attack Sister   . Stroke Neg Hx   . Hypertension Neg Hx     ROS: no fevers or chills, productive cough, hemoptysis, dysphasia, odynophagia, melena, hematochezia, dysuria, hematuria, rash, seizure activity, orthopnea, PND, pedal edema, claudication. Remaining systems are negative.  Physical Exam: Well-developed well-nourished in no acute distress.  Skin is warm and dry.  HEENT is normal.  Neck is supple.  Chest is clear to auscultation with normal expansion.  Cardiovascular exam is regular rate and rhythm.  Abdominal exam nontender or distended. No masses  palpated. Extremities show no edema. neuro grossly intact  ECG-sinus rhythm at a rate of 72, nonspecific ST changes.  Personally reviewed  A/P  1 paroxysmal atrial fibrillation-patient remains in sinus rhythm.  Continue present dose of Xarelto.  Check hemoglobin and renal function.  Continue Toprol for rate control if atrial fibrillation recurs.  2 history of pulmonary embolus-patient will continue Xarelto both for atrial fibrillation and history of pulmonary embolus.  3 hyperlipidemia-continue statin.  4 history of thoracic aortic aneurysm-most recent CTA showed maximum dimension of 3.7 cm.  5 isolated elevated blood pressure reading-have asked her to follow this and we will add medications if needed.  Kirk Ruths, MD

## 2018-11-22 ENCOUNTER — Other Ambulatory Visit: Payer: Self-pay

## 2018-11-22 ENCOUNTER — Ambulatory Visit (INDEPENDENT_AMBULATORY_CARE_PROVIDER_SITE_OTHER): Payer: Medicare HMO | Admitting: Cardiology

## 2018-11-22 ENCOUNTER — Encounter: Payer: Self-pay | Admitting: Cardiology

## 2018-11-22 VITALS — BP 150/80 | HR 72 | Ht 70.0 in | Wt 149.0 lb

## 2018-11-22 DIAGNOSIS — I480 Paroxysmal atrial fibrillation: Secondary | ICD-10-CM

## 2018-11-22 DIAGNOSIS — E7800 Pure hypercholesterolemia, unspecified: Secondary | ICD-10-CM

## 2018-11-22 DIAGNOSIS — R030 Elevated blood-pressure reading, without diagnosis of hypertension: Secondary | ICD-10-CM

## 2018-11-22 LAB — CBC
Hematocrit: 42.4 % (ref 34.0–46.6)
Hemoglobin: 14.5 g/dL (ref 11.1–15.9)
MCH: 31.5 pg (ref 26.6–33.0)
MCHC: 34.2 g/dL (ref 31.5–35.7)
MCV: 92 fL (ref 79–97)
Platelets: 162 10*3/uL (ref 150–450)
RBC: 4.6 x10E6/uL (ref 3.77–5.28)
RDW: 12.7 % (ref 11.7–15.4)
WBC: 4.8 10*3/uL (ref 3.4–10.8)

## 2018-11-22 LAB — BASIC METABOLIC PANEL
BUN/Creatinine Ratio: 17 (ref 12–28)
BUN: 14 mg/dL (ref 8–27)
CO2: 28 mmol/L (ref 20–29)
Calcium: 9.3 mg/dL (ref 8.7–10.3)
Chloride: 102 mmol/L (ref 96–106)
Creatinine, Ser: 0.83 mg/dL (ref 0.57–1.00)
GFR calc Af Amer: 75 mL/min/{1.73_m2} (ref >59)
GFR calc non Af Amer: 65 mL/min/{1.73_m2} (ref >59)
Glucose: 111 mg/dL — ABNORMAL HIGH (ref 65–99)
Potassium: 4.6 mmol/L (ref 3.5–5.2)
Sodium: 142 mmol/L (ref 134–144)

## 2018-11-22 NOTE — Patient Instructions (Signed)
Medication Instructions:  NO CHANGE *If you need a refill on your cardiac medications before your next appointment, please call your pharmacy*  Lab Work: Your physician recommends that you HAVE LAB WORK TODAY If you have labs (blood work) drawn today and your tests are completely normal, you will receive your results only by: Marland Kitchen MyChart Message (if you have MyChart) OR . A paper copy in the mail If you have any lab test that is abnormal or we need to change your treatment, we will call you to review the results.  Follow-Up: At [facility], you and your health needs are our priority.  As part of our continuing mission to provide you with exceptional heart care, we have created designated Provider Care Teams.  These Care Teams include your primary Cardiologist (physician) and Advanced Practice Providers (APPs -  Physician Assistants and Nurse Practitioners) who all work together to provide you with the care you need, when you need it.  Your next appointment:   12 months  The format for your next appointment:   In Person  Provider:   Kirk Ruths, MD

## 2018-12-19 DIAGNOSIS — H2513 Age-related nuclear cataract, bilateral: Secondary | ICD-10-CM | POA: Diagnosis not present

## 2018-12-19 DIAGNOSIS — H2511 Age-related nuclear cataract, right eye: Secondary | ICD-10-CM | POA: Diagnosis not present

## 2018-12-19 DIAGNOSIS — H25013 Cortical age-related cataract, bilateral: Secondary | ICD-10-CM | POA: Diagnosis not present

## 2018-12-26 ENCOUNTER — Ambulatory Visit: Payer: Medicare HMO

## 2019-01-30 ENCOUNTER — Ambulatory Visit
Admission: RE | Admit: 2019-01-30 | Discharge: 2019-01-30 | Disposition: A | Discharge status: Home / Self Care | Payer: Medicare HMO | Source: Ambulatory Visit | Attending: Internal Medicine | Admitting: Internal Medicine

## 2019-01-30 ENCOUNTER — Other Ambulatory Visit: Payer: Self-pay

## 2019-01-30 DIAGNOSIS — Z1231 Encounter for screening mammogram for malignant neoplasm of breast: Secondary | ICD-10-CM

## 2019-02-01 DIAGNOSIS — Z20822 Contact with and (suspected) exposure to covid-19: Secondary | ICD-10-CM | POA: Diagnosis not present

## 2019-02-01 DIAGNOSIS — J019 Acute sinusitis, unspecified: Secondary | ICD-10-CM | POA: Diagnosis not present

## 2019-02-01 DIAGNOSIS — R05 Cough: Secondary | ICD-10-CM | POA: Diagnosis not present

## 2019-02-10 ENCOUNTER — Ambulatory Visit: Payer: Medicare HMO | Attending: Internal Medicine

## 2019-02-10 DIAGNOSIS — Z23 Encounter for immunization: Secondary | ICD-10-CM

## 2019-02-10 NOTE — Progress Notes (Signed)
   Covid-19 Vaccination Clinic  Name:  Whitney Medina    MRN: [id number] DOB: October 03, 1932  02/10/2019  [name] was observed post Covid-19 immunization for 15 minutes without incidence. She was provided with Vaccine Information Sheet and instruction to access the V-Safe system.   [name] was instructed to call 911 with any severe reactions post vaccine: Marland Kitchen Difficulty breathing  . Swelling of your face and throat  . A fast heartbeat  . A bad rash all over your body  . Dizziness and weakness    Immunizations Administered    Name Date Dose VIS Date Route   Pfizer COVID-19 Vaccine 02/10/2019 11:46 AM 0.3 mL 12/29/2018 Intramuscular   Manufacturer: [place]   Lot: [id number]   [place]: [id number]

## 2019-02-21 DIAGNOSIS — H25811 Combined forms of age-related cataract, right eye: Secondary | ICD-10-CM | POA: Diagnosis not present

## 2019-02-21 DIAGNOSIS — H2511 Age-related nuclear cataract, right eye: Secondary | ICD-10-CM | POA: Diagnosis not present

## 2019-02-27 DIAGNOSIS — H25012 Cortical age-related cataract, left eye: Secondary | ICD-10-CM | POA: Diagnosis not present

## 2019-02-27 DIAGNOSIS — H2512 Age-related nuclear cataract, left eye: Secondary | ICD-10-CM | POA: Diagnosis not present

## 2019-03-03 ENCOUNTER — Ambulatory Visit: Payer: Medicare HMO | Attending: Internal Medicine

## 2019-03-03 DIAGNOSIS — Z23 Encounter for immunization: Secondary | ICD-10-CM | POA: Insufficient documentation

## 2019-03-03 NOTE — Progress Notes (Signed)
   Covid-19 Vaccination Clinic  Name:  Whitney Medina    MRN: [id number] DOB: 06-Jul-1932  03/03/2019  Whitney Medina was observed post Covid-19 immunization for 15 minutes without incidence. She was provided with Vaccine Information Sheet and instruction to access the V-Safe system.   Whitney Medina was instructed to call 911 with any severe reactions post vaccine: Marland Kitchen Difficulty breathing  . Swelling of your face and throat  . A fast heartbeat  . A bad rash all over your body  . Dizziness and weakness    Immunizations Administered    Name Date Dose VIS Date Route   Pfizer COVID-19 Vaccine 03/03/2019 10:07 AM 0.3 mL 12/29/2018 Intramuscular   Manufacturer: [place]   Lot: [id number]   [place]: [id number]

## 2019-03-07 DIAGNOSIS — H25812 Combined forms of age-related cataract, left eye: Secondary | ICD-10-CM | POA: Diagnosis not present

## 2019-03-07 DIAGNOSIS — H25012 Cortical age-related cataract, left eye: Secondary | ICD-10-CM | POA: Diagnosis not present

## 2019-03-07 DIAGNOSIS — H2512 Age-related nuclear cataract, left eye: Secondary | ICD-10-CM | POA: Diagnosis not present

## 2019-05-16 ENCOUNTER — Encounter: Payer: Self-pay | Admitting: Cardiology

## 2019-05-16 DIAGNOSIS — E038 Other specified hypothyroidism: Secondary | ICD-10-CM | POA: Diagnosis not present

## 2019-05-16 DIAGNOSIS — M859 Disorder of bone density and structure, unspecified: Secondary | ICD-10-CM | POA: Diagnosis not present

## 2019-05-16 DIAGNOSIS — Z0000 Encounter for general adult medical examination without abnormal findings: Secondary | ICD-10-CM | POA: Diagnosis not present

## 2019-05-16 DIAGNOSIS — E7849 Other hyperlipidemia: Secondary | ICD-10-CM | POA: Diagnosis not present

## 2019-05-23 DIAGNOSIS — D692 Other nonthrombocytopenic purpura: Secondary | ICD-10-CM | POA: Diagnosis not present

## 2019-05-23 DIAGNOSIS — I119 Hypertensive heart disease without heart failure: Secondary | ICD-10-CM | POA: Diagnosis not present

## 2019-05-23 DIAGNOSIS — I7300 Raynaud's syndrome without gangrene: Secondary | ICD-10-CM | POA: Diagnosis not present

## 2019-05-23 DIAGNOSIS — R82998 Other abnormal findings in urine: Secondary | ICD-10-CM | POA: Diagnosis not present

## 2019-05-23 DIAGNOSIS — I480 Paroxysmal atrial fibrillation: Secondary | ICD-10-CM | POA: Diagnosis not present

## 2019-05-23 DIAGNOSIS — E785 Hyperlipidemia, unspecified: Secondary | ICD-10-CM | POA: Diagnosis not present

## 2019-05-23 DIAGNOSIS — I25119 Atherosclerotic heart disease of native coronary artery with unspecified angina pectoris: Secondary | ICD-10-CM | POA: Diagnosis not present

## 2019-05-23 DIAGNOSIS — Z7901 Long term (current) use of anticoagulants: Secondary | ICD-10-CM | POA: Diagnosis not present

## 2019-05-23 DIAGNOSIS — I429 Cardiomyopathy, unspecified: Secondary | ICD-10-CM | POA: Diagnosis not present

## 2019-05-23 DIAGNOSIS — Z1331 Encounter for screening for depression: Secondary | ICD-10-CM | POA: Diagnosis not present

## 2019-05-23 DIAGNOSIS — Z0000 Encounter for general adult medical examination without abnormal findings: Secondary | ICD-10-CM | POA: Diagnosis not present

## 2019-05-23 DIAGNOSIS — I712 Thoracic aortic aneurysm, without rupture: Secondary | ICD-10-CM | POA: Diagnosis not present

## 2019-05-25 DIAGNOSIS — Z1212 Encounter for screening for malignant neoplasm of rectum: Secondary | ICD-10-CM | POA: Diagnosis not present

## 2019-07-03 DIAGNOSIS — H18523 Epithelial (juvenile) corneal dystrophy, bilateral: Secondary | ICD-10-CM | POA: Diagnosis not present

## 2019-07-13 DIAGNOSIS — H18523 Epithelial (juvenile) corneal dystrophy, bilateral: Secondary | ICD-10-CM | POA: Diagnosis not present

## 2019-08-02 DIAGNOSIS — H5022 Vertical strabismus, left eye: Secondary | ICD-10-CM | POA: Diagnosis not present

## 2019-08-02 DIAGNOSIS — H5032 Intermittent alternating esotropia: Secondary | ICD-10-CM | POA: Diagnosis not present

## 2019-09-19 DIAGNOSIS — H5032 Intermittent alternating esotropia: Secondary | ICD-10-CM | POA: Diagnosis not present

## 2019-09-19 DIAGNOSIS — H5212 Myopia, left eye: Secondary | ICD-10-CM | POA: Diagnosis not present

## 2019-09-19 DIAGNOSIS — H5022 Vertical strabismus, left eye: Secondary | ICD-10-CM | POA: Diagnosis not present

## 2019-09-26 ENCOUNTER — Ambulatory Visit: Payer: Medicare HMO | Admitting: Dermatology

## 2019-09-26 ENCOUNTER — Other Ambulatory Visit: Payer: Self-pay

## 2019-09-26 ENCOUNTER — Encounter: Payer: Self-pay | Admitting: Dermatology

## 2019-09-26 DIAGNOSIS — L603 Nail dystrophy: Secondary | ICD-10-CM | POA: Diagnosis not present

## 2019-09-26 DIAGNOSIS — L821 Other seborrheic keratosis: Secondary | ICD-10-CM | POA: Diagnosis not present

## 2019-09-26 DIAGNOSIS — Z1283 Encounter for screening for malignant neoplasm of skin: Secondary | ICD-10-CM

## 2019-09-26 DIAGNOSIS — L719 Rosacea, unspecified: Secondary | ICD-10-CM | POA: Diagnosis not present

## 2019-09-26 DIAGNOSIS — D1801 Hemangioma of skin and subcutaneous tissue: Secondary | ICD-10-CM | POA: Diagnosis not present

## 2019-09-26 MED ORDER — METRONIDAZOLE 0.75 % EX CREA: TOPICAL_CREAM | Freq: Every day | CUTANEOUS | 1 refills | Status: DC

## 2019-09-26 NOTE — Patient Instructions (Addendum)
First visit in years for Whitney Medina date of birth 07-Jun-1932. Several issues discussed today. General skin check of the back and the sun exposed areas did not show any sign of atypical moles or skin cancer.Two types of clinically benign growths include rough tan keratoses (mostly on the collarbone and the back) and smooth red bumps called angiomas. These do not require watching or removal. A more recent onset enlarging flesh-colored bump just next to the right side of the nose will be removed on scheduled follow-up visit. Secondly she asked about new onset of a red patchy rash on her central face which certainly fits rosacea. It is possible that the occasional Covid mask exacerbates this problem. A prescription given for topical metronidazole which Whitney Medina can apply nightly for 1 to 2 months. If the lotion helps to clear this up, she may choose to taper the use. Finally we discussed the ridging and splitting of the distal fingernails which represents onychoschizias. She can look for a nonprescription nail conditioner called Dermanail. If she cannot find it at a local store she may check on Whitney Medina [place] We also discussed the so-called marionette lines on the lower face which may respond to filler therapy but which I choose not to do his part of my practice. She is given the name of cosmetic dermatologist [facility] in [place] or board-certified plastic surgeon Dr. Darrin Luis in [place].

## 2019-10-22 ENCOUNTER — Encounter: Payer: Self-pay | Admitting: Dermatology

## 2019-10-22 NOTE — Progress Notes (Signed)
   New Patient   Subjective  Whitney Medina is a 86 y.o. female who presents for the following: Establish Care (re-est care) and Skin Problem (mass on the right side on nose--noticed for about 6 months--increased in sized--tender to touch).  General skin check Location:  Duration:  Quality: [name] Associated Signs/Symptoms: Modifying Factors:  Severity:  Timing: Also would like to discuss rash on central face Context:    The following portions of the chart were reviewed this encounter and updated as appropriate: Tobacco  Allergies  Meds  Problems  Med Hx  Surg Hx  Fam Hx      Objective  Well appearing patient in no apparent distress; mood and affect are within normal limits.  A focused examination was performed including Head, neck, upper chest, back, arms, legs.. Relevant physical exam findings are noted in the Assessment and Plan.   Assessment & Plan  Encounter for screening for malignant neoplasm of skin Mid Back  Annual skin examination  Seborrheic keratosis (2) Neck - Anterior  Leave if stable  Rosacea (2) Left Malar Cheek; Right Malar Cheek  Rx topical generic MetroCream daily for 6 weeks First visit in years for Whitney Medina date of birth July 18, 1932. Several issues discussed today. General skin check of the back and the sun exposed areas did not show any sign of atypical moles or skin cancer.Two types of clinically benign growths include rough tan keratoses (mostly on the collarbone and the back) and smooth red bumps called angiomas. These do not require watching or removal. A more recent onset enlarging flesh-colored bump just next to the right side of the nose will be removed on scheduled follow-up visit. Secondly she asked about new onset of a red patchy rash on her central face which certainly fits rosacea. It is possible that the occasional Covid mask exacerbates this problem. A prescription given for topical metronidazole  which Whitney Medina can apply nightly for 1 to 2 months. If the lotion helps to clear this up, she may choose to taper the use. Finally we discussed the ridging and splitting of the distal fingernails which represents onychoschizias. She can look for a nonprescription nail conditioner called Dermanail. If she cannot find it at a local store she may check on Whitney Medina [place] We also discussed the so-called marionette lines on the lower face which may respond to filler therapy but which I choose not to do his part of my practice. She is given the name of cosmetic dermatologist [facility] in [place] or board-certified plastic surgeon Dr. Darrin Luis in [place].

## 2019-11-20 DIAGNOSIS — M859 Disorder of bone density and structure, unspecified: Secondary | ICD-10-CM | POA: Diagnosis not present

## 2019-11-20 DIAGNOSIS — Z7901 Long term (current) use of anticoagulants: Secondary | ICD-10-CM | POA: Diagnosis not present

## 2019-11-20 DIAGNOSIS — D692 Other nonthrombocytopenic purpura: Secondary | ICD-10-CM | POA: Diagnosis not present

## 2019-11-20 DIAGNOSIS — M1990 Unspecified osteoarthritis, unspecified site: Secondary | ICD-10-CM | POA: Diagnosis not present

## 2019-11-20 DIAGNOSIS — E785 Hyperlipidemia, unspecified: Secondary | ICD-10-CM | POA: Diagnosis not present

## 2019-11-20 DIAGNOSIS — I712 Thoracic aortic aneurysm, without rupture: Secondary | ICD-10-CM | POA: Diagnosis not present

## 2019-11-20 DIAGNOSIS — E039 Hypothyroidism, unspecified: Secondary | ICD-10-CM | POA: Diagnosis not present

## 2019-11-20 DIAGNOSIS — I119 Hypertensive heart disease without heart failure: Secondary | ICD-10-CM | POA: Diagnosis not present

## 2019-11-20 DIAGNOSIS — F39 Unspecified mood [affective] disorder: Secondary | ICD-10-CM | POA: Diagnosis not present

## 2019-11-20 DIAGNOSIS — I2699 Other pulmonary embolism without acute cor pulmonale: Secondary | ICD-10-CM | POA: Diagnosis not present

## 2019-11-20 DIAGNOSIS — I25119 Atherosclerotic heart disease of native coronary artery with unspecified angina pectoris: Secondary | ICD-10-CM | POA: Diagnosis not present

## 2019-11-23 NOTE — Progress Notes (Signed)
HPI: FU atrial fibrillation.Admitted December 2015 with a syncopal episode.She was found to be in AFib with RVR. EF was noted to be 40-45% (possibly related to tachycardia). It was questioned whether or not her syncope may be related to post-termination pauses. She was placed on beta blocker. Long term anticoagulation with Xarelto was recommended. Her Chadsvasc score is 5. She subsequently underwent a 30 day event monitor which showed no arrhythmias. Lexiscan nuclear study 1/16 showed no ischemia and her ejection fraction had improved to 70%. CTA September 2018showed ectatic ascending thoracic aorta measuring 3.7 cm. Since last seen,she denies dyspnea, chest pain or syncope.  After exercising she occasionally notices her heart rate elevated.  Current Outpatient Medications  Medication Sig Dispense Refill   acetaminophen (TYLENOL) 500 MG tablet Take 1,000 mg by mouth every 6 (six) hours as needed for moderate pain or fever (fever).      Apple Cider Vinegar 600 MG CAPS Take 600 mg by mouth daily.     atorvastatin (LIPITOR) 20 MG tablet Take 20 mg by mouth every evening.     B Complex Vitamins (B COMPLEX-B12 PO) Take 1 tablet by mouth daily.     Bilberry 1000 MG CAPS Take 1,000 mg by mouth daily.     BLACK ELDERBERRY,BERRY-FLOWER, PO Take 1,250 mg by mouth daily.     Calcium Carb-Cholecalciferol (CALCIUM 1000 + D PO) Take 1 tablet by mouth daily.      cholecalciferol (VITAMIN D) 1000 units tablet Take 1 capsule by mouth daily.     Cinnamon 500 MG capsule Take 500 mg by mouth daily.     Flaxseed, Linseed, (FLAX SEED OIL) 1000 MG CAPS Take 1,000 mg by mouth daily.     levothyroxine (SYNTHROID, LEVOTHROID) 100 MCG tablet Take 100 mcg by mouth daily before breakfast.     metoprolol succinate (TOPROL-XL) 25 MG 24 hr tablet Take 25 mg by mouth daily. 1/2 tablet daily      metroNIDAZOLE (METROCREAM) 0.75 % cream Apply topically at bedtime. 45 g 1   Multiple Vitamins-Minerals  (MULTIVITAMIN WITH MINERALS) tablet Take 1 tablet by mouth daily.     rivaroxaban (XARELTO) 20 MG TABS tablet Take 1 tablet (20 mg total) by mouth daily with supper. 30 tablet 5   traZODone (DESYREL) 50 MG tablet Take 50 mg by mouth at bedtime.     vitamin C (ASCORBIC ACID) 500 MG tablet Take 500 mg by mouth daily.      No current facility-administered medications for this visit.     Past Medical History:  Diagnosis Date   Ascending aortic aneurysm (HCC)    3.8 x 4 cm on CTA 12/2013   CAD (coronary artery disease)    a. Chest CTA 01/16/14:  IMPRESSION: 1. Negative for pulmonary embolus or acute vascular abnormality. 2. Atherosclerosis and coronary artery disease. 3. Unchanged ascending aortic ectasia measuring 38 mm x 40 mm.  Recommend annual imaging followup by CTA or MRA   Cardiomyopathy [facility])    a.  Echocardiogram 12/3013:  Left ventricle: Systolic function was mildly to moderately reduced. The estimated ejection fraction was in the range of 40% to 45%. Regional wall motion abnormalities cannot be excluded.  The study is not technically sufficient to allow evaluation of LV diastolic function.   DVT (deep venous thrombosis) (HCC)    History of anemia    Hx of cardiovascular stress test    Lexiscan Myoview (01/2014):  No ischemia or scar, EF 70%; Normal Study/Low Risk  Hypercholesteremia    Hypothyroidism    Left knee DJD    PAF (paroxysmal atrial fibrillation) (HCC)    PE (pulmonary embolism)    Pulmonary embolism (HCC)    Raynaud disease     Past Surgical History:  Procedure Laterality Date   ABDOMINAL HYSTERECTOMY  1973   FROZEN SHOULDER     KNEE ARTHROSCOPY Left    SHOULDER ADHESION RELEASE Left 2000   TONSILLECTOMY  1961   TOTAL KNEE ARTHROPLASTY Left 12/25/2012   Procedure: TOTAL KNEE ARTHROPLASTY- left;  Surgeon: Lorn Junes, MD;  Location: [facility];  Service: Orthopedics;  Laterality: Left;    Social History   Socioeconomic History   Marital  status: Widowed    Spouse name: Not on file   Number of children: Not on file   Years of education: Not on file   Highest education level: Not on file  Occupational History   Not on file  Tobacco Use   Smoking status: Never Smoker   Smokeless tobacco: Never Used  Substance and Sexual Activity   Alcohol use: No    Comment: Rare glass of wine   Drug use: No   Sexual activity: Not on file  Other Topics Concern   Not on file  Social History Narrative   Not on file   Social Determinants of Health   Financial Resource Strain:    Difficulty of Paying Living Expenses: Not on file  Food Insecurity:    Worried About Foley in the Last Year: Not on file   Ran Out of Food in the Last Year: Not on file  Transportation Needs:    Lack of Transportation (Medical): Not on file   Lack of Transportation (Non-Medical): Not on file  Physical Activity:    Days of Exercise per Week: Not on file   Minutes of Exercise per Session: Not on file  Stress:    Feeling of Stress : Not on file  Social Connections:    Frequency of Communication with Friends and Family: Not on file   Frequency of Social Gatherings with Friends and Family: Not on file   Attends Religious Services: Not on file   Active Member of Clubs or Organizations: Not on file   Attends Archivist Meetings: Not on file   Marital Status: Not on file  Intimate Partner Violence:    Fear of Current or Ex-Partner: Not on file   Emotionally Abused: Not on file   Physically Abused: Not on file   Sexually Abused: Not on file    Family History  Problem Relation Age of Onset   Cancer - Other Mother    Diabetes Father    Melanoma Sister    Heart disease Sister 72       CABG   Heart attack Sister    Stroke Neg Hx    Hypertension Neg Hx     ROS: no fevers or chills, productive cough, hemoptysis, dysphasia, odynophagia, melena, hematochezia, dysuria, hematuria, rash, seizure  activity, orthopnea, PND, pedal edema, claudication. Remaining systems are negative.  Physical Exam: Well-developed well-nourished in no acute distress.  Skin is warm and dry.  HEENT is normal.  Neck is supple.  Chest is clear to auscultation with normal expansion.  Cardiovascular exam is regular rate and rhythm.  Abdominal exam nontender or distended. No masses palpated. Extremities show no edema. neuro grossly intact  ECG-normal sinus rhythm at a rate of 77, nonspecific ST changes.  Personally reviewed  A/P  1 paroxysmal atrial fibrillation-patient remains in sinus rhythm.  Continue Toprol at present dose.  Continue Xarelto.  Note she occasionally feels elevated heart rate after exercising.  Her daughter has an apple watch and have asked her to record a rhythm strip for my review.  2 history of pulmonary embolus-continue Xarelto.  3 Hyperlipidemia-continue statin.  4 history of thoracic aortic aneurysm-most recent CTA showed maximum dimension of 3.7 cm.  Kirk Ruths, MD

## 2019-11-30 ENCOUNTER — Ambulatory Visit: Payer: Medicare HMO | Admitting: Cardiology

## 2019-11-30 ENCOUNTER — Encounter: Payer: Self-pay | Admitting: Cardiology

## 2019-11-30 VITALS — BP 124/78 | HR 77 | Ht 70.0 in | Wt 142.0 lb

## 2019-11-30 DIAGNOSIS — I480 Paroxysmal atrial fibrillation: Secondary | ICD-10-CM

## 2019-11-30 DIAGNOSIS — E7800 Pure hypercholesterolemia, unspecified: Secondary | ICD-10-CM

## 2019-11-30 NOTE — Patient Instructions (Signed)

## 2019-12-05 ENCOUNTER — Ambulatory Visit: Payer: Medicare HMO | Admitting: Dermatology

## 2019-12-25 DIAGNOSIS — H18593 Other hereditary corneal dystrophies, bilateral: Secondary | ICD-10-CM | POA: Diagnosis not present

## 2019-12-25 DIAGNOSIS — H04123 Dry eye syndrome of bilateral lacrimal glands: Secondary | ICD-10-CM | POA: Diagnosis not present

## 2019-12-25 DIAGNOSIS — Z961 Presence of intraocular lens: Secondary | ICD-10-CM | POA: Diagnosis not present

## 2019-12-25 DIAGNOSIS — H40013 Open angle with borderline findings, low risk, bilateral: Secondary | ICD-10-CM | POA: Diagnosis not present

## 2019-12-31 ENCOUNTER — Encounter: Payer: Self-pay | Admitting: Dermatology

## 2019-12-31 ENCOUNTER — Ambulatory Visit: Payer: Medicare HMO | Admitting: Dermatology

## 2019-12-31 ENCOUNTER — Other Ambulatory Visit: Payer: Self-pay

## 2019-12-31 DIAGNOSIS — D485 Neoplasm of uncertain behavior of skin: Secondary | ICD-10-CM | POA: Diagnosis not present

## 2019-12-31 DIAGNOSIS — L820 Inflamed seborrheic keratosis: Secondary | ICD-10-CM | POA: Diagnosis not present

## 2019-12-31 NOTE — Patient Instructions (Signed)

## 2020-01-01 ENCOUNTER — Encounter: Payer: Self-pay | Admitting: Dermatology

## 2020-01-01 NOTE — Progress Notes (Signed)
   Follow-Up Visit   Subjective  Whitney Medina is a 86 y.o. female who presents for the following: Follow-up (Patient here today for 8 week follow up to have a spot on the right side of her nose removed. ).  Growth Location: Right inner cheek Duration:  Quality:  Associated Signs/Symptoms: Modifying Factors:  Severity:  Timing: Context: Concerns patient Objective  Well appearing patient in no apparent distress; mood and affect are within normal limits. Objective  Right Malar Cheek: Waxy pink-brown crust with history of enlargement       A focused examination was performed including Head and neck.. Relevant physical exam findings are noted in the Assessment and Plan.   Assessment & Plan    Neoplasm of uncertain behavior of skin Right Malar Cheek  Skin / nail biopsy Type of biopsy: tangential   Informed consent: discussed and consent obtained   Timeout: patient name, date of birth, surgical site, and procedure verified   Procedure prep:  Patient was prepped and draped in usual sterile fashion (Non sterile) Prep type:  Chlorhexidine Anesthesia: the lesion was anesthetized in a standard fashion   Anesthetic:  1% lidocaine w/ epinephrine 1-100,000 local infiltration Instrument used: flexible razor blade   Outcome: patient tolerated procedure well   Post-procedure details: wound care instructions given    Destruction of lesion Complexity: simple   Destruction method: electrodesiccation and curettage   Informed consent: discussed and consent obtained   Timeout:  patient name, date of birth, surgical site, and procedure verified Anesthesia: the lesion was anesthetized in a standard fashion   Anesthetic:  1% lidocaine w/ epinephrine 1-100,000 local infiltration Curettage performed in three different directions: Yes   Electrodesiccation performed over the curetted area: Yes   Curettage cycles:  3 Margin per side (cm):  0.1 Final wound size (cm):   0.6 Hemostasis achieved with:  aluminum chloride Outcome: patient tolerated procedure well with no complications   Post-procedure details: wound care instructions given    Specimen 1 - Surgical pathology Differential Diagnosis: r/o sk  Check Margins: No      I, Lavonna Monarch, MD, have reviewed all documentation for this visit.  The documentation on 01/01/20 for the exam, diagnosis, procedures, and orders are all accurate and complete.

## 2020-01-17 ENCOUNTER — Other Ambulatory Visit: Payer: Self-pay | Admitting: Internal Medicine

## 2020-01-17 DIAGNOSIS — Z1231 Encounter for screening mammogram for malignant neoplasm of breast: Secondary | ICD-10-CM

## 2020-03-04 ENCOUNTER — Other Ambulatory Visit: Payer: Self-pay

## 2020-03-04 ENCOUNTER — Ambulatory Visit
Admission: RE | Admit: 2020-03-04 | Discharge: 2020-03-04 | Disposition: A | Discharge status: Home / Self Care | Payer: Medicare HMO | Source: Ambulatory Visit | Attending: Internal Medicine | Admitting: Internal Medicine

## 2020-03-04 DIAGNOSIS — Z1231 Encounter for screening mammogram for malignant neoplasm of breast: Secondary | ICD-10-CM

## 2020-05-14 DIAGNOSIS — B349 Viral infection, unspecified: Secondary | ICD-10-CM | POA: Diagnosis not present

## 2020-05-14 DIAGNOSIS — I119 Hypertensive heart disease without heart failure: Secondary | ICD-10-CM | POA: Diagnosis not present

## 2020-05-14 DIAGNOSIS — R0781 Pleurodynia: Secondary | ICD-10-CM | POA: Diagnosis not present

## 2020-05-14 DIAGNOSIS — Z7901 Long term (current) use of anticoagulants: Secondary | ICD-10-CM | POA: Diagnosis not present

## 2020-05-14 DIAGNOSIS — Z1152 Encounter for screening for COVID-19: Secondary | ICD-10-CM | POA: Diagnosis not present

## 2020-05-14 DIAGNOSIS — I480 Paroxysmal atrial fibrillation: Secondary | ICD-10-CM | POA: Diagnosis not present

## 2020-05-14 DIAGNOSIS — R509 Fever, unspecified: Secondary | ICD-10-CM | POA: Diagnosis not present

## 2020-05-14 DIAGNOSIS — I2699 Other pulmonary embolism without acute cor pulmonale: Secondary | ICD-10-CM | POA: Diagnosis not present

## 2020-05-14 DIAGNOSIS — Z20828 Contact with and (suspected) exposure to other viral communicable diseases: Secondary | ICD-10-CM | POA: Diagnosis not present

## 2020-05-15 ENCOUNTER — Other Ambulatory Visit: Payer: Self-pay

## 2020-05-15 ENCOUNTER — Emergency Department (HOSPITAL_BASED_OUTPATIENT_CLINIC_OR_DEPARTMENT_OTHER): Payer: Medicare HMO

## 2020-05-15 ENCOUNTER — Emergency Department (HOSPITAL_BASED_OUTPATIENT_CLINIC_OR_DEPARTMENT_OTHER)
Admission: EM | Admit: 2020-05-15 | Discharge: 2020-05-15 | Disposition: A | Discharge status: Home / Self Care | Payer: Medicare HMO | Attending: Emergency Medicine | Admitting: Emergency Medicine

## 2020-05-15 ENCOUNTER — Encounter (HOSPITAL_BASED_OUTPATIENT_CLINIC_OR_DEPARTMENT_OTHER): Payer: Self-pay | Admitting: *Deleted

## 2020-05-15 DIAGNOSIS — K828 Other specified diseases of gallbladder: Secondary | ICD-10-CM

## 2020-05-15 DIAGNOSIS — K8020 Calculus of gallbladder without cholecystitis without obstruction: Secondary | ICD-10-CM | POA: Diagnosis not present

## 2020-05-15 DIAGNOSIS — E039 Hypothyroidism, unspecified: Secondary | ICD-10-CM | POA: Insufficient documentation

## 2020-05-15 DIAGNOSIS — Z79899 Other long term (current) drug therapy: Secondary | ICD-10-CM | POA: Diagnosis not present

## 2020-05-15 DIAGNOSIS — I4891 Unspecified atrial fibrillation: Secondary | ICD-10-CM

## 2020-05-15 DIAGNOSIS — R1011 Right upper quadrant pain: Secondary | ICD-10-CM | POA: Diagnosis not present

## 2020-05-15 DIAGNOSIS — Z20822 Contact with and (suspected) exposure to covid-19: Secondary | ICD-10-CM | POA: Diagnosis not present

## 2020-05-15 DIAGNOSIS — R0789 Other chest pain: Secondary | ICD-10-CM | POA: Diagnosis not present

## 2020-05-15 DIAGNOSIS — I7776 Dissection of artery of upper extremity: Secondary | ICD-10-CM | POA: Diagnosis not present

## 2020-05-15 DIAGNOSIS — Z96652 Presence of left artificial knee joint: Secondary | ICD-10-CM | POA: Diagnosis not present

## 2020-05-15 DIAGNOSIS — R000 Tachycardia, unspecified: Secondary | ICD-10-CM | POA: Insufficient documentation

## 2020-05-15 DIAGNOSIS — I7779 Dissection of other artery: Secondary | ICD-10-CM

## 2020-05-15 DIAGNOSIS — I2510 Atherosclerotic heart disease of native coronary artery without angina pectoris: Secondary | ICD-10-CM | POA: Diagnosis not present

## 2020-05-15 DIAGNOSIS — R079 Chest pain, unspecified: Secondary | ICD-10-CM | POA: Diagnosis not present

## 2020-05-15 DIAGNOSIS — I774 Celiac artery compression syndrome: Secondary | ICD-10-CM | POA: Diagnosis not present

## 2020-05-15 LAB — BASIC METABOLIC PANEL
Anion gap: 10 (ref 5–15)
BUN: 12 mg/dL (ref 8–23)
CO2: 28 mmol/L (ref 22–32)
Calcium: 9.8 mg/dL (ref 8.9–10.3)
Chloride: 100 mmol/L (ref 98–111)
Creatinine, Ser: 0.86 mg/dL (ref 0.44–1.00)
GFR, Estimated: >60 mL/min (ref >60)
Glucose, Bld: 124 mg/dL — ABNORMAL HIGH (ref 70–99)
Potassium: 4 mmol/L (ref 3.5–5.1)
Sodium: 138 mmol/L (ref 135–145)

## 2020-05-15 LAB — TROPONIN I (HIGH SENSITIVITY)
Troponin I (High Sensitivity): 8 ng/L (ref <18)
Troponin I (High Sensitivity): 8 ng/L (ref <18)

## 2020-05-15 LAB — CBC WITH DIFFERENTIAL/PLATELET
Abs Immature Granulocytes: 0.02 10*3/uL (ref 0.00–0.07)
Basophils Absolute: 0 10*3/uL (ref 0.0–0.1)
Basophils Relative: 0 %
Eosinophils Absolute: 0 10*3/uL (ref 0.0–0.5)
Eosinophils Relative: 0 %
HCT: 49.2 % — ABNORMAL HIGH (ref 36.0–46.0)
Hemoglobin: 15.7 g/dL — ABNORMAL HIGH (ref 12.0–15.0)
Immature Granulocytes: 0 %
Lymphocytes Relative: 13 %
Lymphs Abs: 1.2 10*3/uL (ref 0.7–4.0)
MCH: 30.7 pg (ref 26.0–34.0)
MCHC: 31.9 g/dL (ref 30.0–36.0)
MCV: 96.3 fL (ref 80.0–100.0)
Monocytes Absolute: 0.8 10*3/uL (ref 0.1–1.0)
Monocytes Relative: 9 %
Neutro Abs: 7.3 10*3/uL (ref 1.7–7.7)
Neutrophils Relative %: 78 %
Platelets: 152 10*3/uL (ref 150–400)
RBC: 5.11 MIL/uL (ref 3.87–5.11)
RDW: 12.6 % (ref 11.5–15.5)
WBC: 9.3 10*3/uL (ref 4.0–10.5)
nRBC: 0 % (ref 0.0–0.2)

## 2020-05-15 LAB — BRAIN NATRIURETIC PEPTIDE: B Natriuretic Peptide: 356 pg/mL — ABNORMAL HIGH (ref 0.0–100.0)

## 2020-05-15 LAB — HEPATIC FUNCTION PANEL
ALT: 9 U/L (ref 0–44)
AST: 19 U/L (ref 15–41)
Albumin: 4.5 g/dL (ref 3.5–5.0)
Alkaline Phosphatase: 56 U/L (ref 38–126)
Bilirubin, Direct: 0.3 mg/dL — ABNORMAL HIGH (ref 0.0–0.2)
Indirect Bilirubin: 0.9 mg/dL (ref 0.3–0.9)
Total Bilirubin: 1.2 mg/dL (ref 0.3–1.2)
Total Protein: 7.5 g/dL (ref 6.5–8.1)

## 2020-05-15 LAB — LIPASE, BLOOD: Lipase: <10 U/L — ABNORMAL LOW (ref 11–51)

## 2020-05-15 LAB — RESP PANEL BY RT-PCR (FLU A&B, COVID) ARPGX2
Influenza A by PCR: NEGATIVE
Influenza B by PCR: NEGATIVE
SARS Coronavirus 2 by RT PCR: NEGATIVE

## 2020-05-15 LAB — D-DIMER, QUANTITATIVE: D-Dimer, Quant: 0.36 ug/mL-FEU (ref 0.00–0.50)

## 2020-05-15 MED ORDER — OXYCODONE HCL 5 MG PO TABS
5.0000 mg | ORAL_TABLET | Freq: Once | ORAL | Status: AC
  Administered 2020-05-15: 5 mg via ORAL
  Filled 2020-05-15: qty 1

## 2020-05-15 MED ORDER — OXYCODONE-ACETAMINOPHEN 5-325 MG PO TABS: 1.0000 | ORAL_TABLET | Freq: Four times a day (QID) | ORAL | 0 refills | Status: DC | PRN

## 2020-05-15 MED ORDER — IOHEXOL 350 MG/ML SOLN
80.0000 mL | Freq: Once | INTRAVENOUS | Status: AC | PRN
  Administered 2020-05-15: 80 mL via INTRAVENOUS

## 2020-05-15 MED ORDER — METOPROLOL TARTRATE 5 MG/5ML IV SOLN
5.0000 mg | INTRAVENOUS | Status: DC | PRN
  Administered 2020-05-15: 5 mg via INTRAVENOUS
  Filled 2020-05-15: qty 5

## 2020-05-15 NOTE — ED Triage Notes (Signed)
Pain to her mid chest when she breathes that radiates to her back 2 days ago.  She was seen by her PCP yesterday with negative results.

## 2020-05-15 NOTE — ED Notes (Signed)
Consult for vascular surgery called, spoke with [facility]

## 2020-05-15 NOTE — ED Notes (Signed)
Consult made to [facility] for Cardiothoracic Surgery spoke with [place] at [facility]

## 2020-05-15 NOTE — ED Provider Notes (Signed)
[facility] EMERGENCY DEPT Provider Note   CSN: [id number] Arrival date & time: 05/15/20  1203     History Chief Complaint  Patient presents with  . Chest Pain    Whitney Medina is a 87 y.o. female.  The history is provided by the patient.  Chest Pain Pain location:  R chest Pain quality: sharp   Pain radiates to:  Upper back Pain severity:  Moderate Onset quality:  Sudden Duration:  2 days Timing:  Constant Progression:  Worsening Chronicity:  New Context: at rest   Relieved by:  Nothing Worsened by:  Deep breathing Ineffective treatments:  Rest Associated symptoms: no abdominal pain, no altered mental status, no back pain, no cough, no fever, no lower extremity edema, no palpitations, no shortness of breath and no vomiting   Risk factors: aortic disease        Past Medical History:  Diagnosis Date  . Ascending aortic aneurysm (HCC)    3.8 x 4 cm on CTA 12/2013  . CAD (coronary artery disease)    a. Chest CTA 01/16/14:  IMPRESSION: 1. Negative for pulmonary embolus or acute vascular abnormality. 2. Atherosclerosis and coronary artery disease. 3. Unchanged ascending aortic ectasia measuring 38 mm x 40 mm.  Recommend annual imaging followup by CTA or MRA  . Cardiomyopathy [facility])    a.  Echocardiogram 12/3013:  Left ventricle: Systolic function was mildly to moderately reduced. The estimated ejection fraction was in the range of 40% to 45%. Regional wall motion abnormalities cannot be excluded.  The study is not technically sufficient to allow evaluation of LV diastolic function.  . DVT (deep venous thrombosis) ([place])   . History of anemia   . Hx of cardiovascular stress test    Lexiscan Myoview (01/2014):  No ischemia or scar, EF 70%; Normal Study/Low Risk  . Hypercholesteremia   . Hypothyroidism   . Left knee DJD   . PAF (paroxysmal atrial fibrillation) ([place])   . PE (pulmonary embolism)   . Pulmonary embolism ([place])   . Raynaud disease      Patient Active Problem List   Diagnosis Date Noted  . Ataxia 02/13/2015  . Ascending aortic aneurysm ([place]) 02/06/2014  . E-coli UTI 01/18/2014  . Atrial fibrillation with RVR ([place]) 01/17/2014  . Syncope 01/16/2014  . Atrial fibrillation ([place]) 01/16/2014  . Acute respiratory failure with hypoxia ([place]) 01/16/2014  . Lung granuloma ([place]) 01/16/2014  . Pulmonary embolism ([place]) 02/14/2013  . Hypothyroidism   . Hypercholesteremia     Past Surgical History:  Procedure Laterality Date  . ABDOMINAL HYSTERECTOMY  1973  . FROZEN SHOULDER    . KNEE ARTHROSCOPY Left   . SHOULDER ADHESION RELEASE Left 2000  . TONSILLECTOMY  1961  . TOTAL KNEE ARTHROPLASTY Left 12/25/2012   Procedure: TOTAL KNEE ARTHROPLASTY- left;  Surgeon: Lorn Junes, MD;  Location: [facility];  Service: Orthopedics;  Laterality: Left;     OB History   No obstetric history on file.     Family History  Problem Relation Age of Onset  . Cancer - Other Mother   . Diabetes Father   . Melanoma Sister   . Heart disease Sister 37       CABG  . Heart attack Sister   . Stroke Neg Hx   . Hypertension Neg Hx     Social History   Tobacco Use  . Smoking status: Never Smoker  . Smokeless tobacco: Never Used  Substance Use Topics  . Alcohol  use: No    Comment: Rare glass of wine  . Drug use: No    Home Medications Prior to Admission medications   Medication Sig Start Date End Date Taking? Authorizing Provider  Apple Cider Vinegar 600 MG CAPS Take 600 mg by mouth daily.   Yes [provider]  atorvastatin (LIPITOR) 20 MG tablet Take 20 mg by mouth every evening.   Yes [provider]  B Complex Vitamins (B COMPLEX-B12 PO) Take 1 tablet by mouth daily.   Yes [provider]  Bilberry 1000 MG CAPS Take 1,000 mg by mouth daily.   Yes [provider]  Calcium Carb-Cholecalciferol (CALCIUM 1000 + D PO) Take 1 tablet by mouth daily.    Yes [provider]  cholecalciferol  (VITAMIN D) 1000 units tablet Take 1 capsule by mouth daily.   Yes [provider]  Cinnamon 500 MG capsule Take 500 mg by mouth daily.   Yes [provider]  co-enzyme Q-10 50 MG capsule Take 100 mg by mouth daily.   Yes [provider]  Flaxseed, Linseed, (FLAX SEED OIL) 1000 MG CAPS Take 1,000 mg by mouth daily.   Yes [provider]  levothyroxine (SYNTHROID, LEVOTHROID) 100 MCG tablet Take 100 mcg by mouth daily before breakfast.   Yes [provider]  metoprolol succinate (TOPROL-XL) 25 MG 24 hr tablet Take 25 mg by mouth daily. 1/2 tablet daily   Yes [provider]  omega-3 acid ethyl esters (LOVAZA) 1 g capsule Take 1 g by mouth daily.   Yes [provider]  traZODone (DESYREL) 50 MG tablet Take 50 mg by mouth at bedtime.   Yes [provider]  vitamin C (ASCORBIC ACID) 500 MG tablet Take 500 mg by mouth daily.    Yes [provider]  acetaminophen (TYLENOL) 500 MG tablet Take 1,000 mg by mouth every 6 (six) hours as needed for moderate pain or fever (fever).     [provider]  ketorolac (ACULAR) 0.5 % ophthalmic solution  07/11/19   [provider]  prednisoLONE acetate (PRED FORTE) 1 % ophthalmic suspension  07/12/19   [provider]    Allergies    Codeine  Review of Systems   Review of Systems  Constitutional: Negative for chills and fever.  HENT: Negative for ear pain and sore throat.   Eyes: Negative for pain and visual disturbance.  Respiratory: Negative for cough and shortness of breath.   Cardiovascular: Positive for chest pain. Negative for palpitations.  Gastrointestinal: Negative for abdominal pain and vomiting.  Genitourinary: Negative for dysuria and hematuria.  Musculoskeletal: Negative for arthralgias and back pain.  Skin: Negative for color change and rash.  Neurological: Negative for seizures and syncope.  All other systems reviewed and are  negative.   Physical Exam Updated Vital Signs BP 114/72 (BP Location: Left Arm)   Pulse (!) 142   Temp 98.6 F (37 C) (Oral)   Resp 16   Ht 5\' 10"  (1.778 m)   Wt 67.6 kg   SpO2 100%   BMI 21.38 kg/m   Physical Exam Vitals and nursing note reviewed.  Constitutional:      General: She is not in acute distress.    Appearance: She is well-developed.  HENT:     Head: Normocephalic and atraumatic.  Eyes:     Conjunctiva/sclera: Conjunctivae normal.  Cardiovascular:     Rate and Rhythm: Tachycardia present. Rhythm irregular.     Heart sounds: No  murmur heard.   Pulmonary:     Effort: Pulmonary effort is normal. No respiratory distress.     Breath sounds: Normal breath sounds.  Abdominal:     Palpations: Abdomen is soft.     Tenderness: There is no abdominal tenderness.  Musculoskeletal:     Cervical back: Neck supple.  Skin:    General: Skin is warm and dry.  Neurological:     Mental Status: She is alert.     ED Results / Procedures / Treatments   Labs (all labs ordered are listed, but only abnormal results are displayed) Labs Reviewed  BASIC METABOLIC PANEL - Abnormal; Notable for the following components:      Result Value   Glucose, Bld 124 (*)    All other components within normal limits  CBC WITH DIFFERENTIAL/PLATELET - Abnormal; Notable for the following components:   Hemoglobin 15.7 (*)    HCT 49.2 (*)    All other components within normal limits  BRAIN NATRIURETIC PEPTIDE - Abnormal; Notable for the following components:   B Natriuretic Peptide 356.0 (*)    All other components within normal limits  RESP PANEL BY RT-PCR (FLU A&B, COVID) ARPGX2  D-DIMER, QUANTITATIVE  TROPONIN I (HIGH SENSITIVITY)  TROPONIN I (HIGH SENSITIVITY)    EKG EKG Interpretation  Date/Time:  Thursday May 15 2020 12:13:55 EDT Ventricular Rate:  152 PR Interval:    QRS Duration: 70 QT Interval:  316 QTC Calculation: 502 R Axis:   47 Text Interpretation: Atrial  fibrillation with rapid ventricular response Low voltage QRS Nonspecific T wave abnormality Abnormal ECG normal axis Confirmed by Lorre Munroe ([id number]) on 05/15/2020 12:42:21 PM   Radiology DG Chest Port 1 View  Result Date: 05/15/2020 CLINICAL DATA:  Chest pain. EXAM: PORTABLE CHEST 1 VIEW COMPARISON:  January 16, 2014. FINDINGS: The heart size and mediastinal contours are within normal limits. Stable calcified granuloma seen in right lower lobe. No pneumothorax or pleural effusion is noted. No acute pulmonary disease is noted. The visualized skeletal structures are unremarkable. IMPRESSION: No active disease. Aortic Atherosclerosis (ICD10-I70.0). Electronically Signed   By: Marijo Conception M.D.   On: 05/15/2020 12:58   CT Angio Chest/Abd/Pel for Dissection W and/or W/WO  Result Date: 05/15/2020 CLINICAL DATA:  Pleuritic chest pain radiating to her back for the past 2 days. History of ascending aortic aneurysm. EXAM: CT ANGIOGRAPHY CHEST, ABDOMEN AND PELVIS TECHNIQUE: CT chest dated October 11, 2016. Multidetector CT imaging through the chest, abdomen and pelvis was performed using the standard protocol during bolus administration of intravenous contrast. Multiplanar reconstructed images and MIPs were obtained and reviewed to evaluate the vascular anatomy. CONTRAST:  68mL OMNIPAQUE IOHEXOL 350 MG/ML SOLN COMPARISON:  None. FINDINGS: CTA CHEST FINDINGS Cardiovascular: Unchanged mild cardiomegaly with biatrial enlargement. No pericardial effusion. No evidence of thoracic aortic aneurysm or dissection. Coronary, aortic arch, and branch vessel atherosclerotic vascular disease. Small dissection flap in the left proximal subclavian artery (series 6, image 20), new since 2018. No occlusion or flow-limiting stenosis. No central pulmonary embolism. Right lower lobe basal segmental pulmonary artery synechia (series 6, image 75), unchanged since 2018. Mediastinum/Nodes: No enlarged mediastinal, hilar, or axillary  lymph nodes. Unchanged calcified right hilar lymph nodes. Thyroid gland, trachea, and esophagus demonstrate no significant findings. Lungs/Pleura: Unchanged calcified granuloma in the right lower lobe. No suspicious pulmonary nodule. No focal consolidation, pleural effusion, or pneumothorax. Musculoskeletal: No chest wall abnormality. No acute or significant osseous findings. Review of the MIP images confirms the  above findings. CTA ABDOMEN AND PELVIS FINDINGS VASCULAR Aorta: Normal caliber aorta without aneurysm, dissection, vasculitis or significant stenosis. Atherosclerotic calcification. Celiac: Patent. Unchanged mild aneurysmal dilatation of the proximal artery measuring up to 9 mm. No dissection or significant stenosis. SMA: Patent without evidence of aneurysm, dissection, vasculitis or significant stenosis. Renals: Single left and two right renal arteries are patent without evidence of aneurysm, dissection, vasculitis, fibromuscular dysplasia or significant stenosis. IMA: Patent without evidence of aneurysm, dissection, vasculitis or significant stenosis. Inflow: Patent without evidence of aneurysm, dissection, vasculitis or significant stenosis. Veins: No obvious venous abnormality within the limitations of this arterial phase study. Review of the MIP images confirms the above findings. NON-VASCULAR Hepatobiliary: No focal liver abnormality. Distended gallbladder with layering gallstones in the fundus. No wall thickening or biliary dilatation. Pancreas: 7 mm hypodense lesion in the body (series 6, image 146), unchanged since 2017. No ductal dilatation or surrounding inflammatory changes. Spleen: Normal in size without focal abnormality. Adrenals/Urinary Tract: Adrenal glands are unremarkable. Kidneys are normal, without renal calculi, focal lesion, or hydronephrosis. Trace dependent air within the bladder likely related to recent instrumentation. Bladder is otherwise unremarkable. Stomach/Bowel: Stomach is  within normal limits. Diminutive or absent appendix. No evidence of bowel wall thickening, distention, or inflammatory changes. Left-sided colonic diverticulosis. Lymphatic: No enlarged abdominal or pelvic lymph nodes. Reproductive: Status post hysterectomy. No adnexal masses. Other: Trace free fluid in the pelvis.  No pneumoperitoneum. Musculoskeletal: No acute or significant osseous findings. Review of the MIP images confirms the above findings. IMPRESSION: VASCULAR: 1. No evidence of aortic dissection or aneurysm. 2. Small dissection flap in the left proximal subclavian artery, new since 2018. No occlusion or flow-limiting stenosis. 3. 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 CHEST: 1. No acute intrathoracic process. 2. 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 ABDOMEN AND PELVIS: 1. No acute intra-abdominal process. 2. Distended gallbladder with layering gallstones in the fundus. No CT evidence of acute cholecystitis. Correlate for right upper quadrant pain. 3. 7 mm hypodense lesion in the body of the pancreas, unchanged since 2017. Given the patient's age, no further follow-up is required. This recommendation follows ACR consensus guidelines: Management of Incidental Pancreatic Cysts: A White Paper of the ACR Incidental Findings Committee. J Am Coll Radiol 5993;57:017-793. 4. Aortic Atherosclerosis (ICD10-I70.0). Electronically Signed   By: Titus Dubin M.D.   On: 05/15/2020 14:42    Procedures Procedures   Medications Ordered in ED Medications  metoprolol tartrate (LOPRESSOR) injection 5 mg (5 mg Intravenous Given 05/15/20 1443)  oxyCODONE (Oxy IR/ROXICODONE) immediate release tablet 5 mg (has no administration in time range)  iohexol (OMNIPAQUE) 350 MG/ML injection 80 mL (80 mLs Intravenous Contrast Given 05/15/20 1347)    ED Course  I have reviewed the triage vital signs and the nursing  notes.  Pertinent labs & imaging results that were available during my care of the patient were reviewed by me and considered in my medical decision making (see chart for details).    MDM Rules/Calculators/A&P                          Meadow Abramo is 87 years old.  She has a history of atrial fibrillation and a stable thoracic aortic aneurysm.  She presented with right-sided pleuritic chest pain of several days duration.  She was also noted to have tachycardia.  She has a known history of  atrial fibrillation.  She was evaluated for evidence of pulmonary pathology such as PE, aortic dissection.  She was also evaluated for evidence of ACS.  Initial work-up was within normal limits, but the CT scan did reveal some gallstones.  The patient remained in pain, and at this point, decided to pursue sources of pain in her abdomen such as cholecystitis.  Liver enzymes, lipase, and gallbladder ultrasound will be added and are pending at the moment. Final Clinical Impression(s) / ED Diagnoses Final diagnoses:  RUQ pain    Rx / DC Orders ED Discharge Orders    None       Arnaldo Natal, MD 05/15/20 [phone]

## 2020-05-15 NOTE — ED Provider Notes (Signed)
Pt signed out by Dr. Joya Gaskins pending GB US.    IMPRESSION:  1. Hydropic gallbladder containing stones and sludge. No gallbladder  inflammation.  2. No biliary dilatation.     Pt is feeling better after meds.  HR is better after lopressor.  Pt is on Xarelto for afib.  CHA2DS2/VAS Stroke Risk Points  Current as of 11 minutes ago     4 >= 2 Points: High Risk  1 - 1.99 Points: Medium Risk  0 Points: Low Risk    Last Change: N/A      Details    This score determines the patient's risk of having a stroke if the  patient has atrial fibrillation.       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  I spoke with Dr. Orvan Seen (CTS) who recommends speaking with vascular.  No surgery from CTS.  I spoke with Dr. Stanford Breed (Vascular).  As long as pt has good pulses in her left arm with no pain to the left arm and if her bp is controlled, she can go home.  Pt given instructions to return immediately if she develops left arm pain. BP is well controlled.  Pt is very anxious to go home.   Pt is to f/u with Dr. Stanford Breed as an outpatient.  For her gallbladder, pt has no RUQ pain and has never had pain there.  She is told to avoid fatty and greasy foods.  If it hurts, then she would need to f/u with surgery.  Return if worse.     Isla Pence, MD 05/15/20 [facility]

## 2020-05-21 ENCOUNTER — Other Ambulatory Visit: Payer: Self-pay

## 2020-05-21 ENCOUNTER — Emergency Department (HOSPITAL_BASED_OUTPATIENT_CLINIC_OR_DEPARTMENT_OTHER): Payer: Medicare HMO | Admitting: Radiology

## 2020-05-21 ENCOUNTER — Emergency Department (HOSPITAL_BASED_OUTPATIENT_CLINIC_OR_DEPARTMENT_OTHER): Payer: Medicare HMO

## 2020-05-21 ENCOUNTER — Encounter (HOSPITAL_BASED_OUTPATIENT_CLINIC_OR_DEPARTMENT_OTHER): Payer: Self-pay

## 2020-05-21 ENCOUNTER — Emergency Department (HOSPITAL_BASED_OUTPATIENT_CLINIC_OR_DEPARTMENT_OTHER)
Admission: EM | Admit: 2020-05-21 | Discharge: 2020-05-21 | Disposition: A | Discharge status: Home / Self Care | Payer: Medicare HMO | Attending: Emergency Medicine | Admitting: Emergency Medicine

## 2020-05-21 DIAGNOSIS — Z79899 Other long term (current) drug therapy: Secondary | ICD-10-CM | POA: Diagnosis not present

## 2020-05-21 DIAGNOSIS — E039 Hypothyroidism, unspecified: Secondary | ICD-10-CM | POA: Insufficient documentation

## 2020-05-21 DIAGNOSIS — Z7901 Long term (current) use of anticoagulants: Secondary | ICD-10-CM | POA: Diagnosis not present

## 2020-05-21 DIAGNOSIS — I2699 Other pulmonary embolism without acute cor pulmonale: Secondary | ICD-10-CM | POA: Diagnosis not present

## 2020-05-21 DIAGNOSIS — I119 Hypertensive heart disease without heart failure: Secondary | ICD-10-CM | POA: Diagnosis not present

## 2020-05-21 DIAGNOSIS — Z96652 Presence of left artificial knee joint: Secondary | ICD-10-CM | POA: Insufficient documentation

## 2020-05-21 DIAGNOSIS — R0602 Shortness of breath: Secondary | ICD-10-CM | POA: Diagnosis not present

## 2020-05-21 DIAGNOSIS — I2510 Atherosclerotic heart disease of native coronary artery without angina pectoris: Secondary | ICD-10-CM | POA: Diagnosis not present

## 2020-05-21 DIAGNOSIS — I25119 Atherosclerotic heart disease of native coronary artery with unspecified angina pectoris: Secondary | ICD-10-CM | POA: Diagnosis not present

## 2020-05-21 DIAGNOSIS — I4891 Unspecified atrial fibrillation: Secondary | ICD-10-CM | POA: Diagnosis not present

## 2020-05-21 DIAGNOSIS — I712 Thoracic aortic aneurysm, without rupture: Secondary | ICD-10-CM | POA: Diagnosis not present

## 2020-05-21 DIAGNOSIS — I7776 Dissection of artery of upper extremity: Secondary | ICD-10-CM | POA: Diagnosis not present

## 2020-05-21 DIAGNOSIS — I480 Paroxysmal atrial fibrillation: Secondary | ICD-10-CM | POA: Insufficient documentation

## 2020-05-21 LAB — BASIC METABOLIC PANEL
Anion gap: 12 (ref 5–15)
BUN: 11 mg/dL (ref 8–23)
CO2: 28 mmol/L (ref 22–32)
Calcium: 9.6 mg/dL (ref 8.9–10.3)
Chloride: 103 mmol/L (ref 98–111)
Creatinine, Ser: 0.88 mg/dL (ref 0.44–1.00)
GFR, Estimated: >60 mL/min (ref >60)
Glucose, Bld: 97 mg/dL (ref 70–99)
Potassium: 4.4 mmol/L (ref 3.5–5.1)
Sodium: 143 mmol/L (ref 135–145)

## 2020-05-21 LAB — CBC
HCT: 45.5 % (ref 36.0–46.0)
Hemoglobin: 15.1 g/dL — ABNORMAL HIGH (ref 12.0–15.0)
MCH: 31.1 pg (ref 26.0–34.0)
MCHC: 33.2 g/dL (ref 30.0–36.0)
MCV: 93.6 fL (ref 80.0–100.0)
Platelets: 248 10*3/uL (ref 150–400)
RBC: 4.86 MIL/uL (ref 3.87–5.11)
RDW: 12.8 % (ref 11.5–15.5)
WBC: 7 10*3/uL (ref 4.0–10.5)
nRBC: 0 % (ref 0.0–0.2)

## 2020-05-21 LAB — TROPONIN I (HIGH SENSITIVITY)
Troponin I (High Sensitivity): 49 ng/L — ABNORMAL HIGH (ref <18)
Troponin I (High Sensitivity): 46 ng/L — ABNORMAL HIGH (ref <18)

## 2020-05-21 LAB — BRAIN NATRIURETIC PEPTIDE: B Natriuretic Peptide: 306.3 pg/mL — ABNORMAL HIGH (ref 0.0–100.0)

## 2020-05-21 MED ORDER — DILTIAZEM LOAD VIA INFUSION
15.0000 mg | Freq: Once | INTRAVENOUS | Status: AC
  Administered 2020-05-21: 15 mg via INTRAVENOUS
  Filled 2020-05-21: qty 15

## 2020-05-21 MED ORDER — DILTIAZEM HCL 30 MG PO TABS
30.0000 mg | ORAL_TABLET | Freq: Once | ORAL | Status: AC
  Administered 2020-05-21: 30 mg via ORAL
  Filled 2020-05-21: qty 1

## 2020-05-21 MED ORDER — DILTIAZEM HCL-DEXTROSE 125-5 MG/125ML-% IV SOLN (PREMIX)
5.0000 mg/h | INTRAVENOUS | Status: DC
  Administered 2020-05-21: 5 mg/h via INTRAVENOUS
  Filled 2020-05-21: qty 125

## 2020-05-21 MED ORDER — DILTIAZEM HCL 30 MG PO TABS: 30.0000 mg | ORAL_TABLET | Freq: Two times a day (BID) | ORAL | 0 refills | Status: DC

## 2020-05-21 NOTE — ED Notes (Addendum)
Pt converted to NSR, EDP notified and EKG done

## 2020-05-21 NOTE — ED Provider Notes (Signed)
[facility] EMERGENCY DEPT Provider Note   CSN: [id number] Arrival date & time: 05/21/20  1805     History Chief Complaint  Patient presents with  . Atrial Fibrillation    Whitney Medina is a 87 y.o. female.  HPI Patient has history of atrial fibrillation.  She takes Xarelto nightly.  Patient reports that she has been having some shortness of breath with exertion and followed up with her primary care doctor today.  When in the office it was noted that her heart rate was up to the 160s.  She was referred to the emergency department for treatment.  Patient reports has been taking metoprolol 12.5 mg.  She reports she not taking the total 25 mg because it makes her feel funny when she does.  She has not had any chest pain.  She reports she does feel kind of lightheaded and sometimes feels that she could pass out but has not.  No recent fever or chills.    Past Medical History:  Diagnosis Date  . Ascending aortic aneurysm (HCC)    3.8 x 4 cm on CTA 12/2013  . CAD (coronary artery disease)    a. Chest CTA 01/16/14:  IMPRESSION: 1. Negative for pulmonary embolus or acute vascular abnormality. 2. Atherosclerosis and coronary artery disease. 3. Unchanged ascending aortic ectasia measuring 38 mm x 40 mm.  Recommend annual imaging followup by CTA or MRA  . Cardiomyopathy [facility])    a.  Echocardiogram 12/3013:  Left ventricle: Systolic function was mildly to moderately reduced. The estimated ejection fraction was in the range of 40% to 45%. Regional wall motion abnormalities cannot be excluded.  The study is not technically sufficient to allow evaluation of LV diastolic function.  . DVT (deep venous thrombosis) ([place])   . History of anemia   . Hx of cardiovascular stress test    Lexiscan Myoview (01/2014):  No ischemia or scar, EF 70%; Normal Study/Low Risk  . Hypercholesteremia   . Hypothyroidism   . Left knee DJD   . PAF (paroxysmal atrial fibrillation) ([place])   . PE  (pulmonary embolism)   . Pulmonary embolism ([place])   . Raynaud disease     Patient Active Problem List   Diagnosis Date Noted  . Ataxia 02/13/2015  . Ascending aortic aneurysm ([place]) 02/06/2014  . E-coli UTI 01/18/2014  . Atrial fibrillation with RVR ([place]) 01/17/2014  . Syncope 01/16/2014  . Atrial fibrillation ([place]) 01/16/2014  . Acute respiratory failure with hypoxia ([place]) 01/16/2014  . Lung granuloma ([place]) 01/16/2014  . Pulmonary embolism ([place]) 02/14/2013  . Hypothyroidism   . Hypercholesteremia     Past Surgical History:  Procedure Laterality Date  . ABDOMINAL HYSTERECTOMY  1973  . FROZEN SHOULDER    . KNEE ARTHROSCOPY Left   . SHOULDER ADHESION RELEASE Left 2000  . TONSILLECTOMY  1961  . TOTAL KNEE ARTHROPLASTY Left 12/25/2012   Procedure: TOTAL KNEE ARTHROPLASTY- left;  Surgeon: Lorn Junes, MD;  Location: [facility];  Service: Orthopedics;  Laterality: Left;     OB History   No obstetric history on file.     Family History  Problem Relation Age of Onset  . Cancer - Other Mother   . Diabetes Father   . Melanoma Sister   . Heart disease Sister 66       CABG  . Heart attack Sister   . Stroke Neg Hx   . Hypertension Neg Hx     Social History   Tobacco  Use  . Smoking status: Never Smoker  . Smokeless tobacco: Never Used  Substance Use Topics  . Alcohol use: No    Comment: Rare glass of wine  . Drug use: No    Home Medications Prior to Admission medications   Medication Sig Start Date End Date Taking? Authorizing Provider  diltiazem (CARDIZEM) 30 MG tablet Take 1 tablet (30 mg total) by mouth 2 (two) times daily. 05/21/20  Yes Charlesetta Shanks, MD  acetaminophen (TYLENOL) 500 MG tablet Take 1,000 mg by mouth every 6 (six) hours as needed for moderate pain or fever (fever).     [provider]  Apple Cider Vinegar 600 MG CAPS Take 600 mg by mouth daily.    [provider]  atorvastatin (LIPITOR) 20 MG tablet Take 20 mg by mouth every  evening.    [provider]  B Complex Vitamins (B COMPLEX-B12 PO) Take 1 tablet by mouth daily.    [provider]  Bilberry 1000 MG CAPS Take 1,000 mg by mouth daily.    [provider]  Calcium Carb-Cholecalciferol (CALCIUM 1000 + D PO) Take 1 tablet by mouth daily.     [provider]  cholecalciferol (VITAMIN D) 1000 units tablet Take 1 capsule by mouth daily.    [provider]  Cinnamon 500 MG capsule Take 500 mg by mouth daily.    [provider]  co-enzyme Q-10 50 MG capsule Take 100 mg by mouth daily.    [provider]  Flaxseed, Linseed, (FLAX SEED OIL) 1000 MG CAPS Take 1,000 mg by mouth daily.    [provider]  ketorolac (ACULAR) 0.5 % ophthalmic solution  07/11/19   [provider]  levothyroxine (SYNTHROID, LEVOTHROID) 100 MCG tablet Take 100 mcg by mouth daily before breakfast.    [provider]  metoprolol succinate (TOPROL-XL) 25 MG 24 hr tablet Take 25 mg by mouth daily. 1/2 tablet daily    [provider]  omega-3 acid ethyl esters (LOVAZA) 1 g capsule Take 1 g by mouth daily.    [provider]  oxyCODONE-acetaminophen (PERCOCET/ROXICET) 5-325 MG tablet Take 1 tablet by mouth every 6 (six) hours as needed for severe pain. 05/15/20   Isla Pence, MD  prednisoLONE acetate (PRED FORTE) 1 % ophthalmic suspension  07/12/19   [provider]  traZODone (DESYREL) 50 MG tablet Take 50 mg by mouth at bedtime.    [provider]  vitamin C (ASCORBIC ACID) 500 MG tablet Take 500 mg by mouth daily.     [provider]    Allergies    Codeine  Review of Systems   Review of Systems 10 systems reviewed and negative except as per HPI Physical Exam Updated Vital Signs BP 112/67 (BP Location: Right Arm)   Pulse 77   Temp 98.3 F (36.8 C) (Oral)   Resp (!) 22   Ht 5\' 10"  (1.778 m)   Wt 67.1 kg   SpO2 98%   BMI 21.24 kg/m   Physical  Exam Constitutional:      Appearance: She is well-developed.     Comments: Nontoxic.  No respiratory distress at rest  HENT:     Head: Normocephalic and atraumatic.  Eyes:     Extraocular Movements: Extraocular movements intact.  Cardiovascular:     Rate and Rhythm: Tachycardia present. Rhythm irregular.     Heart sounds: Normal heart sounds.  Pulmonary:     Effort: Pulmonary effort is normal.  Breath sounds: Normal breath sounds.  Abdominal:     General: Bowel sounds are normal. There is no distension.     Palpations: Abdomen is soft.     Tenderness: There is no abdominal tenderness.  Musculoskeletal:        General: No swelling or tenderness. Normal range of motion.     Cervical back: Neck supple.  Skin:    General: Skin is warm and dry.  Neurological:     Mental Status: She is alert and oriented to person, place, and time.     GCS: GCS eye subscore is 4. GCS verbal subscore is 5. GCS motor subscore is 6.     Coordination: Coordination normal.  Psychiatric:        Mood and Affect: Mood normal.     ED Results / Procedures / Treatments   Labs (all labs ordered are listed, but only abnormal results are displayed) Labs Reviewed  CBC - Abnormal; Notable for the following components:      Result Value   Hemoglobin 15.1 (*)    All other components within normal limits  BRAIN NATRIURETIC PEPTIDE - Abnormal; Notable for the following components:   B Natriuretic Peptide 306.3 (*)    All other components within normal limits  TROPONIN I (HIGH SENSITIVITY) - Abnormal; Notable for the following components:   Troponin I (High Sensitivity) 49 (*)    All other components within normal limits  TROPONIN I (HIGH SENSITIVITY) - Abnormal; Notable for the following components:   Troponin I (High Sensitivity) 46 (*)    All other components within normal limits  BASIC METABOLIC PANEL    EKG EKG Interpretation  Date/Time:  Wednesday May 21 2020 19:07:57 EDT Ventricular Rate:   78 PR Interval:  149 QRS Duration: 89 QT Interval:  380 QTC Calculation: 433 R Axis:   19 Text Interpretation: Sinus rhythm Low voltage, precordial leads Compared to prior EKG now sinus rhythm.  No acute ischemic changes. Confirmed by Charlesetta Shanks [id number]) on 05/21/2020 7:28:53 PM   Radiology DG Chest Port 1 View  Result Date: 05/21/2020 CLINICAL DATA:  Shortness of breath EXAM: PORTABLE CHEST 1 VIEW COMPARISON:  05/15/2020 FINDINGS: The heart size and mediastinal contours are within normal limits. Both lungs are clear. The visualized skeletal structures are unremarkable. Stable calcified granuloma in the right lower lung. Aortic atherosclerosis. IMPRESSION: No active disease. Electronically Signed   By: Donavan Foil M.D.   On: 05/21/2020 19:16    Procedures Procedures  CRITICAL CARE Performed by: Charlesetta Shanks   Total critical care time: 30  minutes  Critical care time was exclusive of separately billable procedures and treating other patients.  Critical care was necessary to treat or prevent imminent or life-threatening deterioration.  Critical care was time spent personally by me on the following activities: development of treatment plan with patient and/or surrogate as well as nursing, discussions with consultants, evaluation of patient's response to treatment, examination of patient, obtaining history from patient or surrogate, ordering and performing treatments and interventions, ordering and review of laboratory studies, ordering and review of radiographic studies, pulse oximetry and re-evaluation of patient's condition. Medications Ordered in ED Medications  diltiazem (CARDIZEM) 1 mg/mL load via infusion 15 mg (15 mg Intravenous Bolus from Bag 05/21/20 1848)  diltiazem (CARDIZEM) tablet 30 mg (30 mg Oral Given 05/21/20 2025)    ED Course  I have reviewed the triage vital signs and the nursing notes.  Pertinent labs & imaging results that were available during  my care of the  patient were reviewed by me and considered in my medical decision making (see chart for details).    MDM Rules/Calculators/A&P                          Patient presents from her PCPs office with atrial fibrillation with rapid ventricular response.  Rates on the monitor ranging from 130s to 160s.  Patient is mildly symptomatic.  She reports exertional dyspnea and lightheadedness with no syncope.  No chest pain.  Patient was given a Cardizem bolus and drip.  She is already anticoagulated on Xarelto with which she is compliant.  Heart rate responded and patient ultimately converted back to sinus rhythm.  It appears she has paroxysmal atrial fibrillation.  We will plan to continue with rate control medications and close follow-up with cardiology. Final Clinical Impression(s) / ED Diagnoses Final diagnoses:  Paroxysmal atrial fibrillation with rapid ventricular response ([place])    Rx / DC Orders ED Discharge Orders         Ordered    diltiazem (CARDIZEM) 30 MG tablet  2 times daily        05/21/20 2146           Charlesetta Shanks, MD 05/21/20 2149

## 2020-05-21 NOTE — ED Triage Notes (Signed)
Pt was seen on 4/28 for CP and ShOB. Pt followed up with her PCP today relating to that visit and he noted pt was in A-fib with RVR. Pt reports her HR was 160. Pt has associated ShOB, denies CP.

## 2020-05-21 NOTE — Discharge Instructions (Signed)
1.  Continue taking your low-dose of metoprolol.  Try adding Cardizem 30 mg in the morning and evening.  Monitor your heart rate and your blood pressure.  If your heart rate and blood pressure are too low, discontinue the medication.  If on the other hand heart rate is remaining 70s or greater and blood pressure is greater than 130s over 70s, you can take an additional dose of Cardizem every 6 hours if needed.  Cardizem also comes in a long-acting form.  You will need to work with your doctor to determine if you tolerate metoprolol or Cardizem better or needed combination of them both.  It may take some experimentation to come up with the right combination for you. 2.  Return to the emergency department if your heart rate becomes elevated, you have chest pain, you feel he will pass out, you are short of breath or other concerning symptoms.

## 2020-05-22 ENCOUNTER — Telehealth: Payer: Self-pay | Admitting: Cardiology

## 2020-05-22 DIAGNOSIS — I480 Paroxysmal atrial fibrillation: Secondary | ICD-10-CM

## 2020-05-22 NOTE — Progress Notes (Signed)
Primary Care Physician: Prince Solian, MD Primary Cardiologist: Dr Stanford Breed  Primary Electrophysiologist: none Referring Physician: [facility]   Whitney Medina is a 87 y.o. female with a history of CAD, prior PE, HLD, systolic dysfunction, and atrial fibrillation who presents for consultation in the [facility]. She was admitted December 2015 with a syncopal episode and found to be in AFib with RVR. EF was noted to be 40-45% (possibly related to tachycardia).She was placed on beta blocker. Long term anticoagulation with Xarelto was recommended. She subsequently underwent a 30 day event monitor which showed no arrhythmias. Patient is on Xarelto for a CHADS2VASC score of 5. She has been seen twice recently at the ED on 05/15/20 and 05/21/20. She was sent to ED from PCP for rapid rates and converted to SR with diltiazem. She was prescribed diltiazem at discharge. She reports that she feels "better now than I have in a long time."   Today, she denies symptoms of palpitations, chest pain, shortness of breath, orthopnea, PND, lower extremity edema, dizziness, presyncope, syncope, snoring, daytime somnolence, bleeding, or neurologic sequela. The patient is tolerating medications without difficulties and is otherwise without complaint today.    Atrial Fibrillation Risk Factors:  she does not have symptoms or diagnosis of sleep apnea. she does not have a history of rheumatic fever.   she has a BMI of Body mass index is 21.21 kg/m.Marland Kitchen Filed Weights   05/23/20 0846  Weight: 67 kg    Family History  Problem Relation Age of Onset  . Cancer - Other Mother   . Diabetes Father   . Melanoma Sister   . Heart disease Sister 52       CABG  . Heart attack Sister   . Stroke Neg Hx   . Hypertension Neg Hx      Atrial Fibrillation Management history:  Previous antiarrhythmic drugs: none Previous cardioversions: none Previous ablations: none CHADS2VASC  score: 5 Anticoagulation history: Xarelto    Past Medical History:  Diagnosis Date  . Ascending aortic aneurysm (HCC)    3.8 x 4 cm on CTA 12/2013  . CAD (coronary artery disease)    a. Chest CTA 01/16/14:  IMPRESSION: 1. Negative for pulmonary embolus or acute vascular abnormality. 2. Atherosclerosis and coronary artery disease. 3. Unchanged ascending aortic ectasia measuring 38 mm x 40 mm.  Recommend annual imaging followup by CTA or MRA  . Cardiomyopathy [facility])    a.  Echocardiogram 12/3013:  Left ventricle: Systolic function was mildly to moderately reduced. The estimated ejection fraction was in the range of 40% to 45%. Regional wall motion abnormalities cannot be excluded.  The study is not technically sufficient to allow evaluation of LV diastolic function.  . DVT (deep venous thrombosis) ([place])   . History of anemia   . Hx of cardiovascular stress test    Lexiscan Myoview (01/2014):  No ischemia or scar, EF 70%; Normal Study/Low Risk  . Hypercholesteremia   . Hypothyroidism   . Left knee DJD   . PAF (paroxysmal atrial fibrillation) ([place])   . PE (pulmonary embolism)   . Pulmonary embolism ([place])   . Raynaud disease    Past Surgical History:  Procedure Laterality Date  . ABDOMINAL HYSTERECTOMY  1973  . FROZEN SHOULDER    . KNEE ARTHROSCOPY Left   . SHOULDER ADHESION RELEASE Left 2000  . TONSILLECTOMY  1961  . TOTAL KNEE ARTHROPLASTY Left 12/25/2012   Procedure: TOTAL KNEE ARTHROPLASTY- left;  Surgeon: Lorn Junes, MD;  Location: [place];  Service: Orthopedics;  Laterality: Left;    Current Outpatient Medications  Medication Sig Dispense Refill  . acetaminophen (TYLENOL) 500 MG tablet Take 1,000 mg by mouth every 6 (six) hours as needed for moderate pain or fever (fever).     . Apple Cider Vinegar 600 MG CAPS Take 600 mg by mouth daily.    Marland Kitchen atorvastatin (LIPITOR) 20 MG tablet Take 20 mg by mouth every evening.    . B Complex Vitamins (B COMPLEX-B12 PO) Take 1 tablet by  mouth daily.    . Bilberry 1000 MG CAPS Take 1,000 mg by mouth daily.    . Calcium Carb-Cholecalciferol (CALCIUM 1000 + D PO) Take 1 tablet by mouth daily.     . cholecalciferol (VITAMIN D) 1000 units tablet Take 1 capsule by mouth daily.    . Cinnamon 500 MG capsule Take 500 mg by mouth daily.    Marland Kitchen co-enzyme Q-10 50 MG capsule Take 100 mg by mouth daily.    Marland Kitchen diltiazem (CARDIZEM) 30 MG tablet Take 1 tablet (30 mg total) by mouth 2 (two) times daily. 60 tablet 0  . Flaxseed, Linseed, (FLAX SEED OIL) 1000 MG CAPS Take 1,000 mg by mouth daily.    Marland Kitchen levothyroxine (SYNTHROID, LEVOTHROID) 100 MCG tablet Take 100 mcg by mouth daily before breakfast.    . omega-3 acid ethyl esters (LOVAZA) 1 g capsule Take 1 g by mouth daily.    . traZODone (DESYREL) 50 MG tablet Take 50 mg by mouth at bedtime.    . vitamin C (ASCORBIC ACID) 500 MG tablet Take 500 mg by mouth daily.     . rivaroxaban (XARELTO) 20 MG TABS tablet Take 1 tablet (20 mg total) by mouth daily with supper. 30 tablet    No current facility-administered medications for this encounter.    Allergies  Allergen Reactions  . Codeine Nausea And Vomiting    Percocet OK    Social History   Socioeconomic History  . Marital status: Widowed    Spouse name: Not on file  . Number of children: Not on file  . Years of education: Not on file  . Highest education level: Not on file  Occupational History  . Not on file  Tobacco Use  . Smoking status: Never Smoker  . Smokeless tobacco: Never Used  Substance and Sexual Activity  . Alcohol use: No    Comment: Rare glass of wine  . Drug use: No  . Sexual activity: Not on file  Other Topics Concern  . Not on file  Social History Narrative  . Not on file   Social Determinants of Health   Financial Resource Strain: Not on file  Food Insecurity: Not on file  Transportation Needs: Not on file  Physical Activity: Not on file  Stress: Not on file  Social Connections: Not on file  Intimate  Partner Violence: Not on file     ROS- All systems are reviewed and negative except as per the HPI above.  Physical Exam: Vitals:   05/23/20 0846  BP: 134/64  Pulse: 75  Weight: 67 kg  Height: 5\' 10"  (1.778 m)    GEN- The patient is a well appearing elderly female, alert and oriented x 3 today.   Head- normocephalic, atraumatic Eyes-  Sclera clear, conjunctiva pink Ears- hearing intact Oropharynx- clear Neck- supple  Lungs- Clear to ausculation bilaterally, normal work of breathing Heart- Regular rate and rhythm, no murmurs,  rubs or gallops  GI- soft, NT, ND, + BS Extremities- no clubbing, cyanosis, or edema MS- no significant deformity or atrophy Skin- no rash or lesion Psych- euthymic mood, full affect Neuro- strength and sensation are intact  Wt Readings from Last 3 Encounters:  05/23/20 67 kg  05/21/20 67.1 kg  05/15/20 67.6 kg    EKG today demonstrates  SR Vent. rate 75 BPM PR interval 144 ms QRS duration 80 ms QT/QTcB 398/444 ms  Echo 01/16/14 demonstrated  Left ventricle: Systolic function was mildly to moderately  reduced. The estimated ejection fraction was in the range of 40%  to 45%. Regional wall motion abnormalities cannot be excluded.  The study is not technically sufficient to allow evaluation of LV  diastolic function.    Epic records are reviewed at length today  CHA2DS2-VASc Score = 5  The patient's score is based upon: CHF History: Yes HTN History: No Diabetes History: No Stroke History: No Vascular Disease History: Yes Age Score: 2 Gender Score: 1      ASSESSMENT AND PLAN: 1. Paroxysmal Atrial Fibrillation (ICD10:  I48.0) The patient's CHA2DS2-VASc score is 5, indicating a 7.2% annual risk of stroke.   General education about afib provided and questions answered.  Will check echocardiogram Will stop metoprolol and continue diltiazem 30 mg BID. Offered extended release diltiazem. Patient would like to continue present  therapy given how good she feels currently.  Could consider AAD (dofetilide, amio, Multaq) if needed.  Continue Xarelto 20 mg daily  2. Secondary Hypercoagulable State (ICD10:  D68.69) The patient is at significant risk for stroke/thromboembolism based upon her CHA2DS2-VASc Score of 5.  Continue Rivaroxaban (Xarelto).   3. CAD Coronary calcifications noted on CT. No anginal symptoms.  4. Cardiomyopathy Noted in 2015, suspected tachycardia mediated given recovery on stress testing.  Check echo as above.   Follow up in the AF clinic in 4-6 weeks.    [facility] [address] [place], [place] [address] [phone] 05/23/2020 9:13 AM

## 2020-05-22 NOTE — Telephone Encounter (Addendum)
Patient says she was advised that her PCP would be getting her appointment with Dr. Stanford Breed for today or tomorrow per ED visit for A-Fib. Advised that Dr. Stanford Breed is out of the office but we could get her an appointment in our [facility]. Agreeable to A-fib Clinic. Verbalized understanding of plan.

## 2020-05-22 NOTE — Telephone Encounter (Signed)
Patient c/o Palpitations:  High priority if patient c/o lightheadedness, shortness of breath, or chest pain  1) How long have you had palpitations/irregular HR/ Afib? Are you having the symptoms now? Last Thursday  2) Are you currently experiencing lightheadedness, SOB or CP?  No   3) Do you have a history of afib (atrial fibrillation) or irregular heart rhythm? Yes   4) Have you checked your BP or HR? (document readings if available): 123/67; 80  5) Are you experiencing any other symptoms?  Nervousness, hard to catch breath

## 2020-05-23 ENCOUNTER — Other Ambulatory Visit: Payer: Self-pay

## 2020-05-23 ENCOUNTER — Encounter (HOSPITAL_COMMUNITY): Payer: Self-pay | Admitting: Physician Assistant

## 2020-05-23 ENCOUNTER — Ambulatory Visit (HOSPITAL_COMMUNITY)
Admission: RE | Admit: 2020-05-23 | Discharge: 2020-05-23 | Disposition: A | Discharge status: Home / Self Care | Payer: Medicare HMO | Source: Ambulatory Visit | Attending: Physician Assistant | Admitting: Physician Assistant

## 2020-05-23 VITALS — BP 134/64 | HR 75 | Ht 70.0 in | Wt 147.8 lb

## 2020-05-23 DIAGNOSIS — I2584 Coronary atherosclerosis due to calcified coronary lesion: Secondary | ICD-10-CM | POA: Insufficient documentation

## 2020-05-23 DIAGNOSIS — Z79899 Other long term (current) drug therapy: Secondary | ICD-10-CM | POA: Diagnosis not present

## 2020-05-23 DIAGNOSIS — I480 Paroxysmal atrial fibrillation: Secondary | ICD-10-CM

## 2020-05-23 DIAGNOSIS — E785 Hyperlipidemia, unspecified: Secondary | ICD-10-CM | POA: Insufficient documentation

## 2020-05-23 DIAGNOSIS — Z7901 Long term (current) use of anticoagulants: Secondary | ICD-10-CM | POA: Insufficient documentation

## 2020-05-23 DIAGNOSIS — Z79890 Hormone replacement therapy (postmenopausal): Secondary | ICD-10-CM | POA: Insufficient documentation

## 2020-05-23 DIAGNOSIS — I2510 Atherosclerotic heart disease of native coronary artery without angina pectoris: Secondary | ICD-10-CM | POA: Diagnosis not present

## 2020-05-23 DIAGNOSIS — Z86711 Personal history of pulmonary embolism: Secondary | ICD-10-CM | POA: Diagnosis not present

## 2020-05-23 DIAGNOSIS — D6869 Other thrombophilia: Secondary | ICD-10-CM

## 2020-05-23 DIAGNOSIS — I429 Cardiomyopathy, unspecified: Secondary | ICD-10-CM | POA: Insufficient documentation

## 2020-05-23 DIAGNOSIS — Z8249 Family history of ischemic heart disease and other diseases of the circulatory system: Secondary | ICD-10-CM | POA: Diagnosis not present

## 2020-05-23 DIAGNOSIS — Z96652 Presence of left artificial knee joint: Secondary | ICD-10-CM | POA: Diagnosis not present

## 2020-05-23 MED ORDER — RIVAROXABAN 20 MG PO TABS: 20.0000 mg | ORAL_TABLET | Freq: Every day | ORAL | Status: DC

## 2020-05-23 NOTE — Patient Instructions (Signed)
Stop metoprolol

## 2020-05-27 ENCOUNTER — Telehealth: Payer: Self-pay | Admitting: *Deleted

## 2020-05-27 NOTE — Telephone Encounter (Signed)
Will review all meds and symptoms next week Whitney Medina

## 2020-05-27 NOTE — Telephone Encounter (Signed)
Spoke with pt, she has been taking the diltiazem 30 mg twice daily and reports after taking the medication she feels funny. She had chills this morning and spent 2 hours in bed and then felt fine after that. She takes the second dose at bedtime and does not notice anything because she goes to bed. She is wondering if she is on the correct medications. Follow up scheduled next week and Will forward for dr Stanford Breed review

## 2020-06-05 ENCOUNTER — Other Ambulatory Visit: Payer: Self-pay

## 2020-06-05 ENCOUNTER — Telehealth: Payer: Self-pay | Admitting: Cardiology

## 2020-06-05 ENCOUNTER — Encounter: Payer: Self-pay | Admitting: Cardiology

## 2020-06-05 ENCOUNTER — Ambulatory Visit: Payer: Medicare HMO | Admitting: Cardiology

## 2020-06-05 VITALS — BP 128/60 | HR 70 | Ht 70.0 in | Wt 147.0 lb

## 2020-06-05 DIAGNOSIS — I7779 Dissection of other artery: Secondary | ICD-10-CM

## 2020-06-05 DIAGNOSIS — I712 Thoracic aortic aneurysm, without rupture: Secondary | ICD-10-CM | POA: Diagnosis not present

## 2020-06-05 DIAGNOSIS — I480 Paroxysmal atrial fibrillation: Secondary | ICD-10-CM

## 2020-06-05 DIAGNOSIS — I7121 Aneurysm of the ascending aorta, without rupture: Secondary | ICD-10-CM

## 2020-06-05 DIAGNOSIS — I2510 Atherosclerotic heart disease of native coronary artery without angina pectoris: Secondary | ICD-10-CM

## 2020-06-05 DIAGNOSIS — E7800 Pure hypercholesterolemia, unspecified: Secondary | ICD-10-CM | POA: Diagnosis not present

## 2020-06-05 MED ORDER — AMIODARONE HCL 200 MG PO TABS: ORAL_TABLET | ORAL | 0 refills | Status: DC

## 2020-06-05 MED ORDER — AMIODARONE HCL 200 MG PO TABS: 200.0000 mg | ORAL_TABLET | Freq: Every day | ORAL | 3 refills | Status: DC

## 2020-06-05 NOTE — Patient Instructions (Signed)
Medication Instructions:   START AMIODARONE 200 MG ONE TABLET TWICE DAILY X 2 WEEKS THEN DECREASE TO ONE TABLET ONCE DAILY  *If you need a refill on your cardiac medications before your next appointment, please call your pharmacy*  Follow-Up: At [facility], you and your health needs are our priority.  As part of our continuing mission to provide you with exceptional heart care, we have created designated Provider Care Teams.  These Care Teams include your primary Cardiologist (physician) and Advanced Practice Providers (APPs -  Physician Assistants and Nurse Practitioners) who all work together to provide you with the care you need, when you need it.  We recommend signing up for the patient portal called "MyChart".  Sign up information is provided on this After Visit Summary.  MyChart is used to connect with patients for Virtual Visits (Telemedicine).  Patients are able to view lab/test results, encounter notes, upcoming appointments, etc.  Non-urgent messages can be sent to your provider as well.   To learn more about what you can do with MyChart, go to [email or web address].    Your next appointment:   3 month(s)  The format for your next appointment:   In Person  Provider:   Kirk Ruths, MD

## 2020-06-05 NOTE — Progress Notes (Signed)
HPI: FU atrial fibrillation.Admitted December 2015 with a syncopal episode.She was found to be in AFib with RVR. EF was noted to be 40-45% (possibly related to tachycardia). It was questioned whether or not her syncope may be related to post-termination pauses. She was placed on beta blocker. Long term anticoagulation with Xarelto was recommended. Her Chadsvasc score is 5. She subsequently underwent a 30 day event monitor which showed no arrhythmias. Lexiscan nuclear study 1/16 showed no ischemia and her ejection fraction had improved to 70%. CTA September 2018showed ectatic ascending thoracic aorta measuring 3.7 cm.  CTA April 2022 showed small dissection flap in the left proximal subclavian artery and she was asked to follow-up with vascular surgery.  Patient seen in the emergency room April and May 2022 with recurrent atrial fibrillation.  She converted to sinus rhythm with Cardizem.  Since last seen,she notes fatigue and dyspnea on exertion.  She denies orthopnea, PND, pedal edema, chest pain or syncope.  Current Outpatient Medications  Medication Sig Dispense Refill  . acetaminophen (TYLENOL) 500 MG tablet Take 1,000 mg by mouth every 6 (six) hours as needed for moderate pain or fever (fever).     . Apple Cider Vinegar 600 MG CAPS Take 600 mg by mouth daily.    Marland Kitchen atorvastatin (LIPITOR) 20 MG tablet Take 20 mg by mouth every evening.    . B Complex Vitamins (B COMPLEX-B12 PO) Take 1 tablet by mouth daily.    . Bilberry 1000 MG CAPS Take 1,000 mg by mouth daily.    . Calcium Citrate-Vitamin D (CALCIUM + D PO) Take 600 mg by mouth daily.    . cholecalciferol (VITAMIN D) 1000 units tablet Take 1 capsule by mouth daily.    . Cinnamon 500 MG capsule Take 500 mg by mouth daily.    Marland Kitchen co-enzyme Q-10 50 MG capsule Take 100 mg by mouth daily.    Marland Kitchen diltiazem (CARDIZEM) 30 MG tablet Take 1 tablet (30 mg total) by mouth 2 (two) times daily. 60 tablet 0  . Flaxseed, Linseed, (FLAX SEED OIL)  1000 MG CAPS Take 1,000 mg by mouth daily.    Marland Kitchen levothyroxine (SYNTHROID, LEVOTHROID) 100 MCG tablet Take 100 mcg by mouth daily before breakfast.    . omega-3 acid ethyl esters (LOVAZA) 1 g capsule Take 1 g by mouth daily.    . rivaroxaban (XARELTO) 20 MG TABS tablet Take 1 tablet (20 mg total) by mouth daily with supper. 30 tablet   . traZODone (DESYREL) 50 MG tablet Take 50 mg by mouth at bedtime.    . vitamin C (ASCORBIC ACID) 500 MG tablet Take 500 mg by mouth daily.      No current facility-administered medications for this visit.     Past Medical History:  Diagnosis Date  . Ascending aortic aneurysm (HCC)    3.8 x 4 cm on CTA 12/2013  . CAD (coronary artery disease)    a. Chest CTA 01/16/14:  IMPRESSION: 1. Negative for pulmonary embolus or acute vascular abnormality. 2. Atherosclerosis and coronary artery disease. 3. Unchanged ascending aortic ectasia measuring 38 mm x 40 mm.  Recommend annual imaging followup by CTA or MRA  . Cardiomyopathy [facility])    a.  Echocardiogram 12/3013:  Left ventricle: Systolic function was mildly to moderately reduced. The estimated ejection fraction was in the range of 40% to 45%. Regional wall motion abnormalities cannot be excluded.  The study is not technically sufficient to allow evaluation of LV diastolic function.  Marland Kitchen  DVT (deep venous thrombosis) ([place])   . History of anemia   . Hx of cardiovascular stress test    Lexiscan Myoview (01/2014):  No ischemia or scar, EF 70%; Normal Study/Low Risk  . Hypercholesteremia   . Hypothyroidism   . Left knee DJD   . PAF (paroxysmal atrial fibrillation) ([place])   . PE (pulmonary embolism)   . Pulmonary embolism ([place])   . Raynaud disease     Past Surgical History:  Procedure Laterality Date  . ABDOMINAL HYSTERECTOMY  1973  . FROZEN SHOULDER    . KNEE ARTHROSCOPY Left   . SHOULDER ADHESION RELEASE Left 2000  . TONSILLECTOMY  1961  . TOTAL KNEE ARTHROPLASTY Left 12/25/2012   Procedure: TOTAL KNEE  ARTHROPLASTY- left;  Surgeon: Lorn Junes, MD;  Location: [facility];  Service: Orthopedics;  Laterality: Left;    Social History   Socioeconomic History  . Marital status: Widowed    Spouse name: Not on file  . Number of children: Not on file  . Years of education: Not on file  . Highest education level: Not on file  Occupational History  . Not on file  Tobacco Use  . Smoking status: Never Smoker  . Smokeless tobacco: Never Used  Substance and Sexual Activity  . Alcohol use: No    Comment: Rare glass of wine  . Drug use: No  . Sexual activity: Not on file  Other Topics Concern  . Not on file  Social History Narrative  . Not on file   Social Determinants of Health   Financial Resource Strain: Not on file  Food Insecurity: Not on file  Transportation Needs: Not on file  Physical Activity: Not on file  Stress: Not on file  Social Connections: Not on file  Intimate Partner Violence: Not on file    Family History  Problem Relation Age of Onset  . Cancer - Other Mother   . Diabetes Father   . Melanoma Sister   . Heart disease Sister 39       CABG  . Heart attack Sister   . Stroke Neg Hx   . Hypertension Neg Hx     ROS: no fevers or chills, productive cough, hemoptysis, dysphasia, odynophagia, melena, hematochezia, dysuria, hematuria, rash, seizure activity, orthopnea, PND, pedal edema, claudication. Remaining systems are negative.  Physical Exam: Well-developed well-nourished in no acute distress.  Skin is warm and dry.  HEENT is normal.  Neck is supple.  Chest is clear to auscultation with normal expansion.  Cardiovascular exam is regular rate and rhythm.  Abdominal exam nontender or distended. No masses palpated. Extremities show no edema. neuro grossly intact   A/P  1 paroxysmal atrial fibrillation-patient remains in sinus rhythm today.  However she is having more frequent bouts.  Not clear to me that all of her symptoms of dyspnea and fatigue are related  to atrial fibrillation as she apparently was having the symptoms when in sinus rhythm as well.  However given the frequency of her episodes I would like to maintain sinus rhythm to see if she improves overall.  We will begin amiodarone 200 mg twice daily for 1 week then decrease to 200 mg daily.  Continue present dose of diltiazem and Xarelto.  2 coronary artery disease-based on coronary calcifications noted on CT scan; patient is not having chest pain.  Continue statin.  3 history of cardiomyopathy-felt tachycardia mediated.  We will repeat echocardiogram.  4 history of pulmonary embolus-continue Xarelto.  5 hyperlipidemia-continue  statin.  6 history of thoracic aortic aneurysm/dissection and subclavian-follow-up vascular surgery.  Kirk Ruths, MD

## 2020-06-05 NOTE — Addendum Note (Signed)
Addended by: Cristopher Estimable on: 06/05/2020 09:39 AM   Modules accepted: Orders

## 2020-06-05 NOTE — Telephone Encounter (Signed)
Pt c/o medication issue:  1. Name of Medication: amiodarone (PACERONE) 200 MG tablet  diltiazem (CARDIZEM) 30 MG tablet     2. How are you currently taking this medication (dosage and times per day)? Not currently taking   3. Are you having a reaction (difficulty breathing--STAT)?    4. What is your medication issue? PT is calling to confirm she is yp continuing takin both medications

## 2020-06-05 NOTE — Telephone Encounter (Signed)
Whitney Medina was calling to get clarification on Dr Stanford Breed referral she wanted to know if his order is for ascending or descending triple A. Please advise

## 2020-06-05 NOTE — Telephone Encounter (Signed)
Message sent to [facility] for clarification.

## 2020-06-05 NOTE — Telephone Encounter (Signed)
Per Dr Stanford Breed  Appointment note from 06/05/20  We will begin amiodarone 200 mg twice daily for 1 week then decrease to 200 mg daily.  Continue present dose of diltiazem and Xarelto.  spoke to the patient . She wanted clarification if she should continue taking the diltiazem 30 mg  Twice a day .  she states she did  Not remember if Dr Stanford Breed gave informed her or not.  RN informed patient per note she will continue with the Diltiazem 30 mg along with Amiodarone  she voiced understanding

## 2020-06-06 NOTE — Telephone Encounter (Signed)
Spoke with Whitney Medina, referral placed for dissection of the subclavian artery. New referral placed.

## 2020-06-10 DIAGNOSIS — E785 Hyperlipidemia, unspecified: Secondary | ICD-10-CM | POA: Diagnosis not present

## 2020-06-10 DIAGNOSIS — M859 Disorder of bone density and structure, unspecified: Secondary | ICD-10-CM | POA: Diagnosis not present

## 2020-06-10 DIAGNOSIS — E039 Hypothyroidism, unspecified: Secondary | ICD-10-CM | POA: Diagnosis not present

## 2020-06-11 ENCOUNTER — Other Ambulatory Visit: Payer: Self-pay | Admitting: *Deleted

## 2020-06-11 MED ORDER — DILTIAZEM HCL 30 MG PO TABS: 30.0000 mg | ORAL_TABLET | Freq: Two times a day (BID) | ORAL | 3 refills | Status: DC

## 2020-06-17 DIAGNOSIS — I7776 Dissection of artery of upper extremity: Secondary | ICD-10-CM | POA: Diagnosis not present

## 2020-06-17 DIAGNOSIS — I712 Thoracic aortic aneurysm, without rupture: Secondary | ICD-10-CM | POA: Diagnosis not present

## 2020-06-17 DIAGNOSIS — I119 Hypertensive heart disease without heart failure: Secondary | ICD-10-CM | POA: Diagnosis not present

## 2020-06-17 DIAGNOSIS — E039 Hypothyroidism, unspecified: Secondary | ICD-10-CM | POA: Diagnosis not present

## 2020-06-17 DIAGNOSIS — Z0000 Encounter for general adult medical examination without abnormal findings: Secondary | ICD-10-CM | POA: Diagnosis not present

## 2020-06-17 DIAGNOSIS — I25119 Atherosclerotic heart disease of native coronary artery with unspecified angina pectoris: Secondary | ICD-10-CM | POA: Diagnosis not present

## 2020-06-17 DIAGNOSIS — Z7901 Long term (current) use of anticoagulants: Secondary | ICD-10-CM | POA: Diagnosis not present

## 2020-06-17 DIAGNOSIS — F39 Unspecified mood [affective] disorder: Secondary | ICD-10-CM | POA: Diagnosis not present

## 2020-06-17 DIAGNOSIS — R82998 Other abnormal findings in urine: Secondary | ICD-10-CM | POA: Diagnosis not present

## 2020-06-17 DIAGNOSIS — I2699 Other pulmonary embolism without acute cor pulmonale: Secondary | ICD-10-CM | POA: Diagnosis not present

## 2020-06-17 DIAGNOSIS — I4891 Unspecified atrial fibrillation: Secondary | ICD-10-CM | POA: Diagnosis not present

## 2020-06-23 NOTE — Progress Notes (Signed)
[facility] [place]  ASSESSMENT / PLAN: 87 y.o. female with incidentally discovered left proximal subclavian artery dissection causing no clinical symptoms. Mild aortic atherosclerosis.  Recommend the following which can slow the progression of atherosclerosis and reduce the risk of major adverse cardiac / limb events:  Complete cessation from all tobacco products. Blood glucose control with goal A1c < 7%. Blood pressure control with goal blood pressure < 140/90 mmHg. Lipid reduction therapy with goal LDL-C <100 mg/dL (<70 if symptomatic from PAD).  Continue Xarelto for Afib. Would start ASA 81mg  PO QD if she ever discontinues xarelto.  Atorvastatin 40-80mg  PO QD (or other "high intensity" statin therapy).  Counseled about symptoms that should prompt return to care (e.g. ischemic rest pain, ulceration of the left hand, vertebrobasiliar symptoms, etc.) Follow up with me as needed.   CHIEF COMPLAINT: imaging findings  HISTORY OF PRESENT ILLNESS: Whitney Medina is a 87 y.o. female referred to clinic for evaluation of incidentally discovered left subclavian artery dissection during ER evaluation 05/15/2020.  She has had difficulty with-year-old fibrillation, and has been in and out of rapid ventricular response over the spring.  A CT angiogram was performed of her chest 05/15/2020 which demonstrated incidental left subclavian artery dissection.  The patient reports no symptoms in her left arm.  She endorses no claudication type symptoms with heavy use.  She has no pain at rest with her left arm.  She has no vertebrobasilar symptoms when using her left arm..  Past Medical History:  Diagnosis Date  . Ascending aortic aneurysm (HCC)    3.8 x 4 cm on CTA 12/2013  . CAD (coronary artery disease)    a. Chest CTA 01/16/14:  IMPRESSION: 1. Negative for pulmonary embolus or acute vascular abnormality. 2. Atherosclerosis and coronary artery disease. 3. Unchanged ascending  aortic ectasia measuring 38 mm x 40 mm.  Recommend annual imaging followup by CTA or MRA  . Cardiomyopathy [facility])    a.  Echocardiogram 12/3013:  Left ventricle: Systolic function was mildly to moderately reduced. The estimated ejection fraction was in the range of 40% to 45%. Regional wall motion abnormalities cannot be excluded.  The study is not technically sufficient to allow evaluation of LV diastolic function.  . DVT (deep venous thrombosis) ([place])   . History of anemia   . Hx of cardiovascular stress test    Lexiscan Myoview (01/2014):  No ischemia or scar, EF 70%; Normal Study/Low Risk  . Hypercholesteremia   . Hypothyroidism   . Left knee DJD   . PAF (paroxysmal atrial fibrillation) ([place])   . PE (pulmonary embolism)   . Pulmonary embolism ([place])   . Raynaud disease     Past Surgical History:  Procedure Laterality Date  . ABDOMINAL HYSTERECTOMY  1973  . FROZEN SHOULDER    . KNEE ARTHROSCOPY Left   . SHOULDER ADHESION RELEASE Left 2000  . TONSILLECTOMY  1961  . TOTAL KNEE ARTHROPLASTY Left 12/25/2012   Procedure: TOTAL KNEE ARTHROPLASTY- left;  Surgeon: Lorn Junes, MD;  Location: [place];  Service: Orthopedics;  Laterality: Left;    Family History  Problem Relation Age of Onset  . Cancer - Other Mother   . Diabetes Father   . Melanoma Sister   . Heart disease Sister 80       CABG  . Heart attack Sister   . Stroke Neg Hx   . Hypertension Neg Hx     Social History   Socioeconomic History  .  Marital status: Widowed    Spouse name: Not on file  . Number of children: Not on file  . Years of education: Not on file  . Highest education level: Not on file  Occupational History  . Not on file  Tobacco Use  . Smoking status: Never Smoker  . Smokeless tobacco: Never Used  Substance and Sexual Activity  . Alcohol use: No    Comment: Rare glass of wine  . Drug use: No  . Sexual activity: Not on file  Other Topics Concern  . Not on file  Social History Narrative  .  Not on file   Social Determinants of Health   Financial Resource Strain: Not on file  Food Insecurity: Not on file  Transportation Needs: Not on file  Physical Activity: Not on file  Stress: Not on file  Social Connections: Not on file  Intimate Partner Violence: Not on file    Allergies  Allergen Reactions  . Codeine Nausea And Vomiting    Percocet OK    Current Outpatient Medications  Medication Sig Dispense Refill  . acetaminophen (TYLENOL) 500 MG tablet Take 1,000 mg by mouth every 6 (six) hours as needed for moderate pain or fever (fever).     Marland Kitchen amiodarone (PACERONE) 200 MG tablet Take 1 tablet (200 mg total) by mouth daily. 90 tablet 3  . amiodarone (PACERONE) 200 MG tablet 1 tablet twice daily x 2 weeks 28 tablet 0  . Apple Cider Vinegar 600 MG CAPS Take 600 mg by mouth daily.    Marland Kitchen atorvastatin (LIPITOR) 20 MG tablet Take 20 mg by mouth every evening.    . B Complex Vitamins (B COMPLEX-B12 PO) Take 1 tablet by mouth daily.    . Bilberry 1000 MG CAPS Take 1,000 mg by mouth daily.    . Calcium Citrate-Vitamin D (CALCIUM + D PO) Take 600 mg by mouth daily.    . cholecalciferol (VITAMIN D) 1000 units tablet Take 1 capsule by mouth daily.    . Cinnamon 500 MG capsule Take 500 mg by mouth daily.    Marland Kitchen co-enzyme Q-10 50 MG capsule Take 100 mg by mouth daily.    Marland Kitchen diltiazem (CARDIZEM) 30 MG tablet Take 1 tablet (30 mg total) by mouth 2 (two) times daily. 180 tablet 3  . Flaxseed, Linseed, (FLAX SEED OIL) 1000 MG CAPS Take 1,000 mg by mouth daily.    Marland Kitchen levothyroxine (SYNTHROID, LEVOTHROID) 100 MCG tablet Take 100 mcg by mouth daily before breakfast.    . omega-3 acid ethyl esters (LOVAZA) 1 g capsule Take 1 g by mouth daily.    . rivaroxaban (XARELTO) 20 MG TABS tablet Take 1 tablet (20 mg total) by mouth daily with supper. 30 tablet   . traZODone (DESYREL) 50 MG tablet Take 50 mg by mouth at bedtime.    . vitamin C (ASCORBIC ACID) 500 MG tablet Take 500 mg by mouth daily.       No current facility-administered medications for this visit.    REVIEW OF SYSTEMS:  [X]  denotes positive finding, [ ]  denotes negative finding Cardiac  Comments:  Chest pain or chest pressure:    Shortness of breath upon exertion: x   Short of breath when lying flat: x   Irregular heart rhythm: x       Vascular    Pain in calf, thigh, or hip brought on by ambulation:    Pain in feet at night that wakes you up from your sleep:  Blood clot in your veins:    Leg swelling:         Pulmonary    Oxygen at home:    Productive cough:     Wheezing:         Neurologic    Sudden weakness in arms or legs:     Sudden numbness in arms or legs:     Sudden onset of difficulty speaking or slurred speech:    Temporary loss of vision in one eye:     Problems with dizziness:         Gastrointestinal    Blood in stool:     Vomited blood:         Genitourinary    Burning when urinating:     Blood in urine:        Psychiatric    Major depression:         Hematologic    Bleeding problems:    Problems with blood clotting too easily:        Skin    Rashes or ulcers:        Constitutional    Fever or chills:      PHYSICAL EXAM  Vitals:   06/24/20 0857  BP: 120/77  Pulse: 86  Resp: 16  Temp: 97.7 F (36.5 C)  SpO2: 98%  Weight: 144 lb 14.4 oz (65.7 kg)  Height: 5\' 10"  (1.778 m)    Constitutional: Elderly, but well appearing. no distress. Appears well nourished.  Neurologic: CN intact. no focal findings. no sensory loss. Psychiatric: Mood and affect symmetric and appropriate. Eyes: No icterus. No conjunctival pallor. Ears, nose, throat: mucous membranes moist. Midline trachea.  Cardiac: regular rate and rhythm.  Respiratory: unlabored. Abdominal: soft, non-tender, non-distended.  Peripheral vascular:  2+ radial pulses bilaterally  2+ popliteal pulses bilaterally Extremity: No edema. No cyanosis. No pallor.  Skin: No gangrene. No ulceration.  Lymphatic: No  Stemmer's sign. No palpable lymphadenopathy.  PERTINENT LABORATORY AND RADIOLOGIC DATA  Most recent CBC CBC Latest Ref Rng & Units 05/21/2020 05/15/2020 11/22/2018  WBC 4.0 - 10.5 K/uL 7.0 9.3 4.8  Hemoglobin 12.0 - 15.0 g/dL 15.1(H) 15.7(H) 14.5  Hematocrit 36.0 - 46.0 % 45.5 49.2(H) 42.4  Platelets 150 - 400 K/uL 248 152 162     Most recent CMP CMP Latest Ref Rng & Units 05/21/2020 05/15/2020 11/22/2018  Glucose 70 - 99 mg/dL 97 124(H) 111(H)  BUN 8 - 23 mg/dL 11 12 14   Creatinine 0.44 - 1.00 mg/dL 0.88 0.86 0.83  Sodium 135 - 145 mmol/L 143 138 142  Potassium 3.5 - 5.1 mmol/L 4.4 4.0 4.6  Chloride 98 - 111 mmol/L 103 100 102  CO2 22 - 32 mmol/L 28 28 28   Calcium 8.9 - 10.3 mg/dL 9.6 9.8 9.3  Total Protein 6.5 - 8.1 g/dL - 7.5 -  Total Bilirubin 0.3 - 1.2 mg/dL - 1.2 -  Alkaline Phos 38 - 126 U/L - 56 -  AST 15 - 41 U/L - 19 -  ALT 0 - 44 U/L - 9 -    Renal function CrCl cannot be calculated (Patient's most recent lab result is older than the maximum 21 days allowed.).  No results found for: HGBA1C  No results found for: LDLCALC, LDLC, HIRISKLDL, POCLDL, LDLDIRECT, REALLDLC, TOTLDLC   Vascular Imaging: CTA 05/15/20  CLINICAL DATA:  Pleuritic chest pain radiating to her back for the past 2 days. History of ascending aortic aneurysm.  EXAM: CT ANGIOGRAPHY CHEST, ABDOMEN  AND PELVIS  TECHNIQUE: CT chest dated October 11, 2016.  Multidetector CT imaging through the chest, abdomen and pelvis was performed using the standard protocol during bolus administration of intravenous contrast. Multiplanar reconstructed images and MIPs were obtained and reviewed to evaluate the vascular anatomy.  CONTRAST:  74mL OMNIPAQUE IOHEXOL 350 MG/ML SOLN  COMPARISON:  None.  FINDINGS: CTA CHEST FINDINGS  Cardiovascular: Unchanged mild cardiomegaly with biatrial enlargement. No pericardial effusion. No evidence of thoracic aortic aneurysm or dissection. Coronary, aortic arch, and  branch vessel atherosclerotic vascular disease. Small dissection flap in the left proximal subclavian artery (series 6, image 20), new since 2018. No occlusion or flow-limiting stenosis. No central pulmonary embolism. Right lower lobe basal segmental pulmonary artery synechia (series 6, image 75), unchanged since 2018.  Mediastinum/Nodes: No enlarged mediastinal, hilar, or axillary lymph nodes. Unchanged calcified right hilar lymph nodes. Thyroid gland, trachea, and esophagus demonstrate no significant findings.  Lungs/Pleura: Unchanged calcified granuloma in the right lower lobe. No suspicious pulmonary nodule. No focal consolidation, pleural effusion, or pneumothorax.  Musculoskeletal: No chest wall abnormality. No acute or significant osseous findings.  Review of the MIP images confirms the above findings.  CTA ABDOMEN AND PELVIS FINDINGS  VASCULAR  Aorta: Normal caliber aorta without aneurysm, dissection, vasculitis or significant stenosis. Atherosclerotic calcification.  Celiac: Patent. Unchanged mild aneurysmal dilatation of the proximal artery measuring up to 9 mm. No dissection or significant stenosis.  SMA: Patent without evidence of aneurysm, dissection, vasculitis or significant stenosis.  Renals: Single left and two right renal arteries are patent without evidence of aneurysm, dissection, vasculitis, fibromuscular dysplasia or significant stenosis.  IMA: Patent without evidence of aneurysm, dissection, vasculitis or significant stenosis.  Inflow: Patent without evidence of aneurysm, dissection, vasculitis or significant stenosis.  Veins: No obvious venous abnormality within the limitations of this arterial phase study.  Review of the MIP images confirms the above findings.  NON-VASCULAR  Hepatobiliary: No focal liver abnormality. Distended gallbladder with layering gallstones in the fundus. No wall thickening or biliary  dilatation.  Pancreas: 7 mm hypodense lesion in the body (series 6, image 146), unchanged since 2017. No ductal dilatation or surrounding inflammatory changes.  Spleen: Normal in size without focal abnormality.  Adrenals/Urinary Tract: Adrenal glands are unremarkable. Kidneys are normal, without renal calculi, focal lesion, or hydronephrosis. Trace dependent air within the bladder likely related to recent instrumentation. Bladder is otherwise unremarkable.  Stomach/Bowel: Stomach is within normal limits. Diminutive or absent appendix. No evidence of bowel wall thickening, distention, or inflammatory changes. Left-sided colonic diverticulosis.  Lymphatic: No enlarged abdominal or pelvic lymph nodes.  Reproductive: Status post hysterectomy. No adnexal masses.  Other: Trace free fluid in the pelvis.  No pneumoperitoneum.  Musculoskeletal: No acute or significant osseous findings.  Review of the MIP images confirms the above findings.  IMPRESSION: VASCULAR:  1. No evidence of aortic dissection or aneurysm. 2. Small dissection flap in the left proximal subclavian artery, new since 2018. No occlusion or flow-limiting stenosis. 3. 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  CHEST:  1. No acute intrathoracic process. 2. 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  ABDOMEN AND PELVIS:  1. No acute intra-abdominal process. 2. Distended gallbladder with layering gallstones in the fundus. No CT evidence of acute cholecystitis. Correlate for right upper quadrant pain. 3. 7 mm hypodense lesion in the body of the pancreas, unchanged since 2017. Given the patient's age, no further follow-up  is required. This recommendation follows ACR consensus guidelines: Management of Incidental Pancreatic Cysts: A White Paper of the ACR Incidental Findings Committee. J Am Coll Radiol  3968;86:[id number]. 4. Aortic Atherosclerosis (ICD10-I70.0).   Electronically Signed   By: Titus Dubin M.D.   On: 05/15/2020 14:42  Yevonne Aline. Stanford Breed, MD [facility] Phone Number: [phone] 06/23/2020 5:32 PM

## 2020-06-24 ENCOUNTER — Other Ambulatory Visit: Payer: Self-pay

## 2020-06-24 ENCOUNTER — Encounter: Payer: Self-pay | Admitting: Vascular Surgery

## 2020-06-24 ENCOUNTER — Ambulatory Visit: Payer: Medicare HMO | Admitting: Vascular Surgery

## 2020-06-24 VITALS — BP 120/77 | HR 86 | Temp 97.7°F | Resp 16 | Ht 70.0 in | Wt 144.9 lb

## 2020-06-24 DIAGNOSIS — I7779 Dissection of other artery: Secondary | ICD-10-CM

## 2020-07-01 ENCOUNTER — Other Ambulatory Visit: Payer: Self-pay

## 2020-07-01 ENCOUNTER — Ambulatory Visit (HOSPITAL_COMMUNITY)
Admission: RE | Admit: 2020-07-01 | Discharge: 2020-07-01 | Disposition: A | Discharge status: Home / Self Care | Payer: Medicare HMO | Source: Ambulatory Visit | Attending: Physician Assistant | Admitting: Physician Assistant

## 2020-07-01 DIAGNOSIS — I480 Paroxysmal atrial fibrillation: Secondary | ICD-10-CM | POA: Diagnosis not present

## 2020-07-01 DIAGNOSIS — I071 Rheumatic tricuspid insufficiency: Secondary | ICD-10-CM | POA: Diagnosis not present

## 2020-07-01 DIAGNOSIS — I700 Atherosclerosis of aorta: Secondary | ICD-10-CM | POA: Insufficient documentation

## 2020-07-01 LAB — ECHOCARDIOGRAM COMPLETE
AR max vel: 2.42 cm2
AV Area VTI: 2.54 cm2
AV Area mean vel: 2.43 cm2
AV Mean grad: 2 mmHg
AV Peak grad: 3.2 mmHg
Ao pk vel: 0.9 m/s
Area-P 1/2: 2.99 cm2
S' Lateral: 2.5 cm

## 2020-07-04 ENCOUNTER — Ambulatory Visit (HOSPITAL_COMMUNITY)
Admission: RE | Admit: 2020-07-04 | Discharge: 2020-07-04 | Disposition: A | Discharge status: Home / Self Care | Payer: Medicare HMO | Source: Ambulatory Visit | Attending: Physician Assistant | Admitting: Physician Assistant

## 2020-07-04 ENCOUNTER — Other Ambulatory Visit: Payer: Self-pay

## 2020-07-04 ENCOUNTER — Encounter (HOSPITAL_COMMUNITY): Payer: Self-pay | Admitting: Physician Assistant

## 2020-07-04 VITALS — BP 132/78 | HR 75 | Ht 70.0 in | Wt 145.4 lb

## 2020-07-04 DIAGNOSIS — Z7901 Long term (current) use of anticoagulants: Secondary | ICD-10-CM | POA: Insufficient documentation

## 2020-07-04 DIAGNOSIS — I480 Paroxysmal atrial fibrillation: Secondary | ICD-10-CM | POA: Insufficient documentation

## 2020-07-04 DIAGNOSIS — E7800 Pure hypercholesterolemia, unspecified: Secondary | ICD-10-CM | POA: Diagnosis not present

## 2020-07-04 DIAGNOSIS — Z86711 Personal history of pulmonary embolism: Secondary | ICD-10-CM | POA: Diagnosis not present

## 2020-07-04 DIAGNOSIS — I491 Atrial premature depolarization: Secondary | ICD-10-CM | POA: Insufficient documentation

## 2020-07-04 DIAGNOSIS — I429 Cardiomyopathy, unspecified: Secondary | ICD-10-CM | POA: Insufficient documentation

## 2020-07-04 DIAGNOSIS — E785 Hyperlipidemia, unspecified: Secondary | ICD-10-CM | POA: Insufficient documentation

## 2020-07-04 DIAGNOSIS — I2510 Atherosclerotic heart disease of native coronary artery without angina pectoris: Secondary | ICD-10-CM | POA: Insufficient documentation

## 2020-07-04 DIAGNOSIS — D6869 Other thrombophilia: Secondary | ICD-10-CM | POA: Insufficient documentation

## 2020-07-04 DIAGNOSIS — Z79899 Other long term (current) drug therapy: Secondary | ICD-10-CM | POA: Diagnosis not present

## 2020-07-04 DIAGNOSIS — I700 Atherosclerosis of aorta: Secondary | ICD-10-CM | POA: Insufficient documentation

## 2020-07-04 NOTE — Progress Notes (Signed)
Primary Care Physician: Prince Solian, MD Primary Cardiologist: Dr Stanford Breed  Primary Electrophysiologist: none Referring Physician: [facility]   Whitney Medina is a 87 y.o. female with a history of CAD, prior PE, HLD, systolic dysfunction, and atrial fibrillation who presents for follow up in the [facility]. She was admitted December 2015 with a syncopal episode and found to be in AFib with RVR.  EF was noted to be 40-45% (possibly related to tachycardia). She was placed on beta blocker.  Long term anticoagulation with Xarelto was recommended. She subsequently underwent a 30 day event monitor which showed no arrhythmias. Patient is on Xarelto for a CHADS2VASC score of 5. She has been seen twice at the ED on 05/15/20 and 05/21/20. She was sent to ED from PCP for rapid rates and converted to SR with diltiazem. She was prescribed diltiazem at discharge.   On follow up today, patient reports that she has done well since her last visit. She was prescribed amiodarone but never started the medication due to concern about side effects. She denies interim heart racing or palpitations. She denies any bleeding issues on anticoagulation.   Today, she denies symptoms of palpitations, chest pain, shortness of breath, orthopnea, PND, lower extremity edema, dizziness, presyncope, syncope, snoring, daytime somnolence, bleeding, or neurologic sequela. The patient is tolerating medications without difficulties and is otherwise without complaint today.    Atrial Fibrillation Risk Factors:  she does not have symptoms or diagnosis of sleep apnea. she does not have a history of rheumatic fever.   she has a BMI of Body mass index is 20.86 kg/m.Marland Kitchen Filed Weights   07/04/20 0856  Weight: 66 kg     Family History  Problem Relation Age of Onset   Cancer - Other Mother    Diabetes Father    Melanoma Sister    Heart disease Sister 36       CABG   Heart attack Sister     Stroke Neg Hx    Hypertension Neg Hx      Atrial Fibrillation Management history:  Previous antiarrhythmic drugs: amiodarone  Previous cardioversions: none Previous ablations: none CHADS2VASC score: 5 Anticoagulation history: Xarelto    Past Medical History:  Diagnosis Date   Ascending aortic aneurysm (HCC)    3.8 x 4 cm on CTA 12/2013   CAD (coronary artery disease)    a. Chest CTA 01/16/14:  IMPRESSION: 1. Negative for pulmonary embolus or acute vascular abnormality. 2. Atherosclerosis and coronary artery disease. 3. Unchanged ascending aortic ectasia measuring 38 mm x 40 mm.  Recommend annual imaging followup by CTA or MRA   Cardiomyopathy [facility])    a.  Echocardiogram 12/3013:  Left ventricle: Systolic function was mildly to moderately reduced. The estimated ejection fraction was in the range of 40% to 45%. Regional wall motion abnormalities cannot be excluded.  The study is not technically sufficient to allow evaluation of LV diastolic function.   DVT (deep venous thrombosis) (HCC)    History of anemia    Hx of cardiovascular stress test    Lexiscan Myoview (01/2014):  No ischemia or scar, EF 70%; Normal Study/Low Risk   Hypercholesteremia    Hypothyroidism    Left knee DJD    PAF (paroxysmal atrial fibrillation) (HCC)    PE (pulmonary embolism)    Pulmonary embolism (HCC)    Raynaud disease    Past Surgical History:  Procedure Laterality Date   ABDOMINAL HYSTERECTOMY  1973  FROZEN SHOULDER     KNEE ARTHROSCOPY Left    SHOULDER ADHESION RELEASE Left 2000   TONSILLECTOMY  1961   TOTAL KNEE ARTHROPLASTY Left 12/25/2012   Procedure: TOTAL KNEE ARTHROPLASTY- left;  Surgeon: Lorn Junes, MD;  Location: [facility];  Service: Orthopedics;  Laterality: Left;    Current Outpatient Medications  Medication Sig Dispense Refill   acetaminophen (TYLENOL) 500 MG tablet Take 1,000 mg by mouth every 6 (six) hours as needed for moderate pain or fever (fever).      Apple Cider  Vinegar 600 MG CAPS Take 600 mg by mouth daily.     atorvastatin (LIPITOR) 20 MG tablet Take 20 mg by mouth every evening.     B Complex Vitamins (B COMPLEX-B12 PO) Take 1 tablet by mouth daily.     Bilberry 1000 MG CAPS Take 1,000 mg by mouth daily.     Calcium Citrate-Vitamin D (CALCIUM + D PO) Take 600 mg by mouth daily.     cholecalciferol (VITAMIN D) 1000 units tablet Take 1 capsule by mouth daily.     Cinnamon 500 MG capsule Take 500 mg by mouth daily.     co-enzyme Q-10 50 MG capsule Take 100 mg by mouth daily.     diltiazem (CARDIZEM) 30 MG tablet Take 1 tablet (30 mg total) by mouth 2 (two) times daily. 180 tablet 3   Flaxseed, Linseed, (FLAX SEED OIL) 1000 MG CAPS Take 1,000 mg by mouth daily.     levothyroxine (SYNTHROID) 88 MCG tablet Take 88 mcg by mouth daily before breakfast.     omega-3 acid ethyl esters (LOVAZA) 1 g capsule Take 1 g by mouth daily.     rivaroxaban (XARELTO) 20 MG TABS tablet Take 1 tablet (20 mg total) by mouth daily with supper. 30 tablet    traZODone (DESYREL) 50 MG tablet Take 50 mg by mouth at bedtime.     vitamin C (ASCORBIC ACID) 500 MG tablet Take 500 mg by mouth daily.      No current facility-administered medications for this encounter.    Allergies  Allergen Reactions   Codeine Nausea And Vomiting    Percocet OK    Social History   Socioeconomic History   Marital status: Widowed    Spouse name: Not on file   Number of children: Not on file   Years of education: Not on file   Highest education level: Not on file  Occupational History   Not on file  Tobacco Use   Smoking status: Never   Smokeless tobacco: Never  Vaping Use   Vaping Use: Never used  Substance and Sexual Activity   Alcohol use: No    Comment: Rare glass of wine   Drug use: No   Sexual activity: Not on file  Other Topics Concern   Not on file  Social History Narrative   Not on file   Social Determinants of Health   Financial Resource Strain: Not on file  Food  Insecurity: Not on file  Transportation Needs: Not on file  Physical Activity: Not on file  Stress: Not on file  Social Connections: Not on file  Intimate Partner Violence: Not on file     ROS- All systems are reviewed and negative except as per the HPI above.  Physical Exam: Vitals:   07/04/20 0856  BP: 132/78  Pulse: 75  Weight: 66 kg  Height: 5\' 10"  (1.778 m)     GEN- The patient is a well appearing  elderly female, alert and oriented x 3 today.   HEENT-head normocephalic, atraumatic, sclera clear, conjunctiva pink, hearing intact, trachea midline. Lungs- Clear to ausculation bilaterally, normal work of breathing Heart- Regular rate and rhythm, no murmurs, rubs or gallops  GI- soft, NT, ND, + BS Extremities- no clubbing, cyanosis, or edema MS- no significant deformity or atrophy Skin- no rash or lesion Psych- euthymic mood, full affect Neuro- strength and sensation are intact   Wt Readings from Last 3 Encounters:  07/04/20 66 kg  06/24/20 65.7 kg  06/05/20 66.7 kg    EKG today demonstrates  SR, PAC Vent. rate 75 BPM PR interval 144 ms QRS duration 72 ms QT/QTcB 376/419 ms  Echo 07/01/20 demonstrated   1. Left ventricular ejection fraction, by estimation, is 60 to 65%. The  left ventricle has normal function. The left ventricle has no regional  wall motion abnormalities. Left ventricular diastolic parameters are  indeterminate.   2. Right ventricular systolic function is normal. The right ventricular  size is normal.   3. The mitral valve is normal in structure. Trivial mitral valve  regurgitation.   4. The aortic valve is tricuspid. There is moderate calcification of the  aortic valve. Aortic valve regurgitation is not visualized.    Epic records are reviewed at length today  CHA2DS2-VASc Score = 5  The patient's score is based upon: CHF History: Yes HTN History: No Diabetes History: No Stroke History: No Vascular Disease History: Yes Age Score:  2 Gender Score: 1      ASSESSMENT AND PLAN: 1. Paroxysmal Atrial Fibrillation (ICD10:  I48.0) The patient's CHA2DS2-VASc score is 5, indicating a 7.2% annual risk of stroke.   We discussed therapeutic options today including AAD (amio, dofetilide). She is concerned about the side effect profile of amiodarone and does not feel her afib symptoms warrant dofetilide at this time. Would have a low threshold to initiate AAD given her rapid rates in afib if she is agreeable.  Continue diltiazem 30 mg BID Continue Xarelto 20 mg daily. CrCl ~50 mL/min. Monitor closely for dose reduction.  2. Secondary Hypercoagulable State (ICD10:  D68.69) The patient is at significant risk for stroke/thromboembolism based upon her CHA2DS2-VASc Score of 5.  Continue Rivaroxaban (Xarelto).   3. CAD Coronary calcifications noted on CT. No anginal symptoms.   4. Cardiomyopathy Noted in 2015, suspected tachycardia mediated given recovery on stress testing.  EF 60-65% on echo 07/01/20 No signs or symptoms of fluid overload.   Follow up with Dr Stanford Breed as scheduled. AF clinic in 6 months.    [facility] [address] [place], [place] [address] [phone] 07/04/2020 1:15 PM

## 2020-08-27 DIAGNOSIS — E039 Hypothyroidism, unspecified: Secondary | ICD-10-CM | POA: Diagnosis not present

## 2020-08-30 NOTE — Progress Notes (Deleted)
HPI:  FU atrial fibrillation. Admitted December 2015 with a syncopal episode. She was found to be in AFib with RVR.  EF was noted to be 40-45% (possibly related to tachycardia).  It was questioned whether or not her syncope may be related to post-termination pauses.  She was placed on beta blocker.  Long term anticoagulation with Xarelto was recommended.  Her Chadsvasc score is 5. She subsequently underwent a 30 day event monitor which showed no arrhythmias. Lexiscan nuclear study 1/16 showed no ischemia and her ejection fraction had improved to 70%. CTA September 2018 showed ectatic ascending thoracic aorta measuring 3.7 cm.  CTA April 2022 showed small dissection flap in the left proximal subclavian artery and she was asked to follow-up with vascular surgery.  Patient seen in the emergency room April and May 2022 with recurrent atrial fibrillation.  She converted to sinus rhythm with Cardizem.  At last office visit patient was having more frequent episodes of atrial fibrillation.  Amiodarone was recommended but she declined due to risk of side effects. Echocardiogram June 2022 showed normal LV function.  Since last seen,   Current Outpatient Medications  Medication Sig Dispense Refill   acetaminophen (TYLENOL) 500 MG tablet Take 1,000 mg by mouth every 6 (six) hours as needed for moderate pain or fever (fever).      Apple Cider Vinegar 600 MG CAPS Take 600 mg by mouth daily.     atorvastatin (LIPITOR) 20 MG tablet Take 20 mg by mouth every evening.     B Complex Vitamins (B COMPLEX-B12 PO) Take 1 tablet by mouth daily.     Bilberry 1000 MG CAPS Take 1,000 mg by mouth daily.     Calcium Citrate-Vitamin D (CALCIUM + D PO) Take 600 mg by mouth daily.     cholecalciferol (VITAMIN D) 1000 units tablet Take 1 capsule by mouth daily.     Cinnamon 500 MG capsule Take 500 mg by mouth daily.     co-enzyme Q-10 50 MG capsule Take 100 mg by mouth daily.     diltiazem (CARDIZEM) 30 MG tablet Take 1  tablet (30 mg total) by mouth 2 (two) times daily. 180 tablet 3   Flaxseed, Linseed, (FLAX SEED OIL) 1000 MG CAPS Take 1,000 mg by mouth daily.     levothyroxine (SYNTHROID) 88 MCG tablet Take 88 mcg by mouth daily before breakfast.     omega-3 acid ethyl esters (LOVAZA) 1 g capsule Take 1 g by mouth daily.     rivaroxaban (XARELTO) 20 MG TABS tablet Take 1 tablet (20 mg total) by mouth daily with supper. 30 tablet    traZODone (DESYREL) 50 MG tablet Take 50 mg by mouth at bedtime.     vitamin C (ASCORBIC ACID) 500 MG tablet Take 500 mg by mouth daily.      No current facility-administered medications for this visit.     Past Medical History:  Diagnosis Date   Ascending aortic aneurysm (HCC)    3.8 x 4 cm on CTA 12/2013   CAD (coronary artery disease)    a. Chest CTA 01/16/14:  IMPRESSION: 1. Negative for pulmonary embolus or acute vascular abnormality. 2. Atherosclerosis and coronary artery disease. 3. Unchanged ascending aortic ectasia measuring 38 mm x 40 mm.  Recommend annual imaging followup by CTA or MRA   Cardiomyopathy [facility])    a.  Echocardiogram 12/3013:  Left ventricle: Systolic function was mildly to moderately reduced. The estimated ejection fraction was in the  range of 40% to 45%. Regional wall motion abnormalities cannot be excluded.  The study is not technically sufficient to allow evaluation of LV diastolic function.   DVT (deep venous thrombosis) (HCC)    History of anemia    Hx of cardiovascular stress test    Lexiscan Myoview (01/2014):  No ischemia or scar, EF 70%; Normal Study/Low Risk   Hypercholesteremia    Hypothyroidism    Left knee DJD    PAF (paroxysmal atrial fibrillation) (HCC)    PE (pulmonary embolism)    Pulmonary embolism (HCC)    Raynaud disease     Past Surgical History:  Procedure Laterality Date   ABDOMINAL HYSTERECTOMY  1973   FROZEN SHOULDER     KNEE ARTHROSCOPY Left    SHOULDER ADHESION RELEASE Left 2000   TONSILLECTOMY  1961   TOTAL KNEE  ARTHROPLASTY Left 12/25/2012   Procedure: TOTAL KNEE ARTHROPLASTY- left;  Surgeon: Lorn Junes, MD;  Location: [facility];  Service: Orthopedics;  Laterality: Left;    Social History   Socioeconomic History   Marital status: Widowed    Spouse name: Not on file   Number of children: Not on file   Years of education: Not on file   Highest education level: Not on file  Occupational History   Not on file  Tobacco Use   Smoking status: Never   Smokeless tobacco: Never  Vaping Use   Vaping Use: Never used  Substance and Sexual Activity   Alcohol use: No    Comment: Rare glass of wine   Drug use: No   Sexual activity: Not on file  Other Topics Concern   Not on file  Social History Narrative   Not on file   Social Determinants of Health   Financial Resource Strain: Not on file  Food Insecurity: Not on file  Transportation Needs: Not on file  Physical Activity: Not on file  Stress: Not on file  Social Connections: Not on file  Intimate Partner Violence: Not on file    Family History  Problem Relation Age of Onset   Cancer - Other Mother    Diabetes Father    Melanoma Sister    Heart disease Sister 41       CABG   Heart attack Sister    Stroke Neg Hx    Hypertension Neg Hx     ROS: no fevers or chills, productive cough, hemoptysis, dysphasia, odynophagia, melena, hematochezia, dysuria, hematuria, rash, seizure activity, orthopnea, PND, pedal edema, claudication. Remaining systems are negative.  Physical Exam: Well-developed well-nourished in no acute distress.  Skin is warm and dry.  HEENT is normal.  Neck is supple.  Chest is clear to auscultation with normal expansion.  Cardiovascular exam is regular rate and rhythm.  Abdominal exam nontender or distended. No masses palpated. Extremities show no edema. neuro grossly intact  ECG- personally reviewed  A/P  1 paroxysmal atrial fibrillation-patient remains in sinus rhythm. Continue Cardizem and Xarelto.  Check  hemoglobin, renal function.  At previous office visit she was describing increased frequency of atrial fibrillation.  We prescribed amiodarone but she did not initiate due to concern about side effects.  We will consider in the future if she has more frequent episodes and is agreeable.  2 coronary artery disease-based on previous CT demonstrating coronary calcification.  Continue statin.  3 history of cardiomyopathy-felt to be tachycardia mediated.  LV function has normalized on most recent echocardiogram.  4 hyperlipidemia-continue statin.  5 history of  thoracic aortic aneurysm/dissection of subclavian-Per vascular surgery.  6 previous pulmonary embolus-continue Xarelto.  Kirk Ruths, MD

## 2020-09-03 DIAGNOSIS — I2699 Other pulmonary embolism without acute cor pulmonale: Secondary | ICD-10-CM | POA: Diagnosis not present

## 2020-09-03 DIAGNOSIS — I25119 Atherosclerotic heart disease of native coronary artery with unspecified angina pectoris: Secondary | ICD-10-CM | POA: Diagnosis not present

## 2020-09-03 DIAGNOSIS — E039 Hypothyroidism, unspecified: Secondary | ICD-10-CM | POA: Diagnosis not present

## 2020-09-03 DIAGNOSIS — M8580 Other specified disorders of bone density and structure, unspecified site: Secondary | ICD-10-CM | POA: Diagnosis not present

## 2020-09-03 DIAGNOSIS — E785 Hyperlipidemia, unspecified: Secondary | ICD-10-CM | POA: Diagnosis not present

## 2020-09-03 DIAGNOSIS — I119 Hypertensive heart disease without heart failure: Secondary | ICD-10-CM | POA: Diagnosis not present

## 2020-09-03 DIAGNOSIS — I712 Thoracic aortic aneurysm, without rupture: Secondary | ICD-10-CM | POA: Diagnosis not present

## 2020-09-03 DIAGNOSIS — I4891 Unspecified atrial fibrillation: Secondary | ICD-10-CM | POA: Diagnosis not present

## 2020-09-03 DIAGNOSIS — Z7901 Long term (current) use of anticoagulants: Secondary | ICD-10-CM | POA: Diagnosis not present

## 2020-09-09 ENCOUNTER — Ambulatory Visit: Payer: Medicare HMO | Admitting: Cardiology

## 2020-12-15 DIAGNOSIS — M8580 Other specified disorders of bone density and structure, unspecified site: Secondary | ICD-10-CM | POA: Diagnosis not present

## 2020-12-15 DIAGNOSIS — I119 Hypertensive heart disease without heart failure: Secondary | ICD-10-CM | POA: Diagnosis not present

## 2020-12-15 DIAGNOSIS — I25119 Atherosclerotic heart disease of native coronary artery with unspecified angina pectoris: Secondary | ICD-10-CM | POA: Diagnosis not present

## 2020-12-15 DIAGNOSIS — Z7901 Long term (current) use of anticoagulants: Secondary | ICD-10-CM | POA: Diagnosis not present

## 2020-12-15 DIAGNOSIS — I712 Thoracic aortic aneurysm, without rupture, unspecified: Secondary | ICD-10-CM | POA: Diagnosis not present

## 2020-12-15 DIAGNOSIS — I4891 Unspecified atrial fibrillation: Secondary | ICD-10-CM | POA: Diagnosis not present

## 2020-12-15 DIAGNOSIS — E785 Hyperlipidemia, unspecified: Secondary | ICD-10-CM | POA: Diagnosis not present

## 2020-12-15 DIAGNOSIS — E039 Hypothyroidism, unspecified: Secondary | ICD-10-CM | POA: Diagnosis not present

## 2020-12-15 DIAGNOSIS — D692 Other nonthrombocytopenic purpura: Secondary | ICD-10-CM | POA: Diagnosis not present

## 2021-01-02 ENCOUNTER — Ambulatory Visit (HOSPITAL_COMMUNITY)
Admission: RE | Admit: 2021-01-02 | Discharge: 2021-01-02 | Disposition: A | Discharge status: Home / Self Care | Payer: Medicare HMO | Source: Ambulatory Visit | Attending: Physician Assistant | Admitting: Physician Assistant

## 2021-01-02 ENCOUNTER — Other Ambulatory Visit: Payer: Self-pay

## 2021-01-02 VITALS — BP 160/74 | HR 74 | Ht 70.0 in | Wt 143.6 lb

## 2021-01-02 DIAGNOSIS — Z86711 Personal history of pulmonary embolism: Secondary | ICD-10-CM | POA: Insufficient documentation

## 2021-01-02 DIAGNOSIS — I429 Cardiomyopathy, unspecified: Secondary | ICD-10-CM | POA: Diagnosis not present

## 2021-01-02 DIAGNOSIS — D6869 Other thrombophilia: Secondary | ICD-10-CM | POA: Insufficient documentation

## 2021-01-02 DIAGNOSIS — I2510 Atherosclerotic heart disease of native coronary artery without angina pectoris: Secondary | ICD-10-CM | POA: Diagnosis not present

## 2021-01-02 DIAGNOSIS — Z79899 Other long term (current) drug therapy: Secondary | ICD-10-CM | POA: Diagnosis not present

## 2021-01-02 DIAGNOSIS — Z7901 Long term (current) use of anticoagulants: Secondary | ICD-10-CM | POA: Diagnosis not present

## 2021-01-02 DIAGNOSIS — E785 Hyperlipidemia, unspecified: Secondary | ICD-10-CM | POA: Insufficient documentation

## 2021-01-02 DIAGNOSIS — I5189 Other ill-defined heart diseases: Secondary | ICD-10-CM | POA: Insufficient documentation

## 2021-01-02 DIAGNOSIS — I480 Paroxysmal atrial fibrillation: Secondary | ICD-10-CM | POA: Diagnosis not present

## 2021-01-02 MED ORDER — RIVAROXABAN 15 MG PO TABS
15.0000 mg | ORAL_TABLET | Freq: Every day | ORAL | 3 refills | Status: DC
Start: 2021-01-02 — End: 2021-05-12

## 2021-01-02 NOTE — Progress Notes (Signed)
Primary Care Physician: Prince Solian, MD Primary Cardiologist: Dr Stanford Breed  Primary Electrophysiologist: none Referring Physician: [facility]   Christiane Ha Ollis is a 87 y.o. female with a history of CAD, prior PE, HLD, systolic dysfunction, and atrial fibrillation who presents for follow up in the [facility]. She was admitted December 2015 with a syncopal episode and found to be in AFib with RVR.  EF was noted to be 40-45% (possibly related to tachycardia). She was placed on beta blocker.  Long term anticoagulation with Xarelto was recommended. She subsequently underwent a 30 day event monitor which showed no arrhythmias. Patient is on Xarelto for a CHADS2VASC score of 5. She has been seen twice at the ED on 05/15/20 and 05/21/20. She was sent to ED from PCP for rapid rates and converted to SR with diltiazem. She was prescribed diltiazem at discharge.   On follow up today, patient reports that she has done well from a cardiac standpoint. She has not had any symptoms of heart racing. She does report significant stress taking care of her disabled adult son. She denies any bleeding issues on anticoagulation.   Today, she denies symptoms of palpitations, chest pain, shortness of breath, orthopnea, PND, lower extremity edema, dizziness, presyncope, syncope, snoring, daytime somnolence, bleeding, or neurologic sequela. The patient is tolerating medications without difficulties and is otherwise without complaint today.    Atrial Fibrillation Risk Factors:  she does not have symptoms or diagnosis of sleep apnea. she does not have a history of rheumatic fever.   she has a BMI of Body mass index is 20.6 kg/m.Marland Kitchen Filed Weights   01/02/21 0856  Weight: 65.1 kg      Family History  Problem Relation Age of Onset   Cancer - Other Mother    Diabetes Father    Melanoma Sister    Heart disease Sister 40       CABG   Heart attack Sister    Stroke Neg Hx     Hypertension Neg Hx      Atrial Fibrillation Management history:  Previous antiarrhythmic drugs: amiodarone  Previous cardioversions: none Previous ablations: none CHADS2VASC score: 5 Anticoagulation history: Xarelto    Past Medical History:  Diagnosis Date   Ascending aortic aneurysm (HCC)    3.8 x 4 cm on CTA 12/2013   CAD (coronary artery disease)    a. Chest CTA 01/16/14:  IMPRESSION: 1. Negative for pulmonary embolus or acute vascular abnormality. 2. Atherosclerosis and coronary artery disease. 3. Unchanged ascending aortic ectasia measuring 38 mm x 40 mm.  Recommend annual imaging followup by CTA or MRA   Cardiomyopathy [facility])    a.  Echocardiogram 12/3013:  Left ventricle: Systolic function was mildly to moderately reduced. The estimated ejection fraction was in the range of 40% to 45%. Regional wall motion abnormalities cannot be excluded.  The study is not technically sufficient to allow evaluation of LV diastolic function.   DVT (deep venous thrombosis) (HCC)    History of anemia    Hx of cardiovascular stress test    Lexiscan Myoview (01/2014):  No ischemia or scar, EF 70%; Normal Study/Low Risk   Hypercholesteremia    Hypothyroidism    Left knee DJD    PAF (paroxysmal atrial fibrillation) (HCC)    PE (pulmonary embolism)    Pulmonary embolism (HCC)    Raynaud disease    Past Surgical History:  Procedure Laterality Date   ABDOMINAL HYSTERECTOMY  1973  FROZEN SHOULDER     KNEE ARTHROSCOPY Left    SHOULDER ADHESION RELEASE Left 2000   TONSILLECTOMY  1961   TOTAL KNEE ARTHROPLASTY Left 12/25/2012   Procedure: TOTAL KNEE ARTHROPLASTY- left;  Surgeon: Lorn Junes, MD;  Location: [facility];  Service: Orthopedics;  Laterality: Left;    Current Outpatient Medications  Medication Sig Dispense Refill   acetaminophen (TYLENOL) 500 MG tablet Take 1,000 mg by mouth every 6 (six) hours as needed for moderate pain or fever (fever).      Apple Cider Vinegar 600 MG CAPS Take  600 mg by mouth daily.     atorvastatin (LIPITOR) 20 MG tablet Take 20 mg by mouth every evening.     B Complex Vitamins (B COMPLEX-B12 PO) Take 1 tablet by mouth daily.     Bilberry 1000 MG CAPS Take 1,000 mg by mouth daily.     Calcium Citrate-Vitamin D (CALCIUM + D PO) Take 600 mg by mouth daily.     Carboxymeth-Glyc-Polysorb PF (REFRESH OPTIVE MEGA-3) 0.5-1-0.5 % SOLN one drop in both eyes twice a day     cholecalciferol (VITAMIN D) 1000 units tablet Take 1 capsule by mouth daily.     Cinnamon 500 MG capsule Take 500 mg by mouth daily.     co-enzyme Q-10 50 MG capsule Take 100 mg by mouth daily.     diltiazem (CARDIZEM) 30 MG tablet Take 1 tablet (30 mg total) by mouth 2 (two) times daily. 180 tablet 3   Flaxseed, Linseed, (FLAX SEED OIL) 1000 MG CAPS Take 1,000 mg by mouth daily.     levothyroxine (SYNTHROID) 88 MCG tablet Take 88 mcg by mouth daily before breakfast.     omega-3 acid ethyl esters (LOVAZA) 1 g capsule Take 1 g by mouth daily.     traZODone (DESYREL) 50 MG tablet Take 50 mg by mouth at bedtime.     vitamin C (ASCORBIC ACID) 500 MG tablet Take 500 mg by mouth daily.      rivaroxaban (XARELTO) 15 MG TABS tablet Take 1 tablet (15 mg total) by mouth daily with supper. 30 tablet 3   No current facility-administered medications for this encounter.    Allergies  Allergen Reactions   Codeine Nausea And Vomiting    Percocet OK   Crestor [Rosuvastatin] Nausea Only    Social History   Socioeconomic History   Marital status: Widowed    Spouse name: Not on file   Number of children: Not on file   Years of education: Not on file   Highest education level: Not on file  Occupational History   Not on file  Tobacco Use   Smoking status: Never   Smokeless tobacco: Never  Vaping Use   Vaping Use: Never used  Substance and Sexual Activity   Alcohol use: No    Comment: Rare glass of wine   Drug use: No   Sexual activity: Not on file  Other Topics Concern   Not on file   Social History Narrative   Not on file   Social Determinants of Health   Financial Resource Strain: Not on file  Food Insecurity: Not on file  Transportation Needs: Not on file  Physical Activity: Not on file  Stress: Not on file  Social Connections: Not on file  Intimate Partner Violence: Not on file     ROS- All systems are reviewed and negative except as per the HPI above.  Physical Exam: Vitals:   01/02/21 0856  BP: (!) 160/74  Pulse: 74  Weight: 65.1 kg  Height: 5\' 10"  (1.778 m)    GEN- The patient is a well appearing elderly female, alert and oriented x 3 today.   HEENT-head normocephalic, atraumatic, sclera clear, conjunctiva pink, hearing intact, trachea midline. Lungs- Clear to ausculation bilaterally, normal work of breathing Heart- Regular rate and rhythm, no murmurs, rubs or gallops  GI- soft, NT, ND, + BS Extremities- no clubbing, cyanosis, or edema MS- no significant deformity or atrophy Skin- no rash or lesion Psych- euthymic mood, full affect Neuro- strength and sensation are intact   Wt Readings from Last 3 Encounters:  01/02/21 65.1 kg  07/04/20 66 kg  06/24/20 65.7 kg    EKG today demonstrates  SR Vent. rate 74 BPM PR interval 152 ms QRS duration 72 ms QT/QTcB 380/421 ms  Echo 07/01/20 demonstrated   1. Left ventricular ejection fraction, by estimation, is 60 to 65%. The  left ventricle has normal function. The left ventricle has no regional  wall motion abnormalities. Left ventricular diastolic parameters are  indeterminate.   2. Right ventricular systolic function is normal. The right ventricular  size is normal.   3. The mitral valve is normal in structure. Trivial mitral valve  regurgitation.   4. The aortic valve is tricuspid. There is moderate calcification of the  aortic valve. Aortic valve regurgitation is not visualized.    Epic records are reviewed at length today  CHA2DS2-VASc Score = 5  The patient's score is based  upon: CHF History: 1 HTN History: 0 Diabetes History: 0 Stroke History: 0 Vascular Disease History: 1 Age Score: 2 Gender Score: 1      ASSESSMENT AND PLAN: 1. Paroxysmal Atrial Fibrillation (ICD10:  I48.0) The patient's CHA2DS2-VASc score is 5, indicating a 7.2% annual risk of stroke.   Patient appears to be maintaining SR.  We discussed smart device technology for home monitoring.  Would have a low threshold to initiate AAD given her rapid rates in afib.  Continue diltiazem 30 mg BID Labs reviewed from PCP in Care everywhere 11/28. CrCl now 46 mL/min. Will decrease Xarelto to 15 mg daily  2. Secondary Hypercoagulable State (ICD10:  D68.69) The patient is at significant risk for stroke/thromboembolism based upon her CHA2DS2-VASc Score of 5.  Continue Rivaroxaban (Xarelto).   3. CAD Coronary calcifications noted on CT. No anginal symptoms.  4. Cardiomyopathy Noted in 2015, suspected tachycardia mediated given recovery on stress testing.  EF 60-65% on echo 07/01/20 No signs or symptoms of fluid overload.   Follow up in the AF clinic in 6 months. Dr Stanford Breed as scheduled.    [facility] [address] [place], [place] [address] [phone] 01/02/2021 9:55 AM

## 2021-01-02 NOTE — Patient Instructions (Signed)
Decrease Xarelto to 15mg  once a day with supper

## 2021-01-12 DIAGNOSIS — H532 Diplopia: Secondary | ICD-10-CM | POA: Diagnosis not present

## 2021-01-12 DIAGNOSIS — H04123 Dry eye syndrome of bilateral lacrimal glands: Secondary | ICD-10-CM | POA: Diagnosis not present

## 2021-01-12 DIAGNOSIS — H40013 Open angle with borderline findings, low risk, bilateral: Secondary | ICD-10-CM | POA: Diagnosis not present

## 2021-01-12 DIAGNOSIS — Z961 Presence of intraocular lens: Secondary | ICD-10-CM | POA: Diagnosis not present

## 2021-01-12 DIAGNOSIS — H524 Presbyopia: Secondary | ICD-10-CM | POA: Diagnosis not present

## 2021-03-02 NOTE — Progress Notes (Signed)
HPI: FU atrial fibrillation. Admitted December 2015 with a syncopal episode. She was found to be in AFib with RVR.  EF was noted to be 40-45% (possibly related to tachycardia).  It was questioned whether or not her syncope may be related to post-termination pauses.  She was placed on beta blocker.  Long term anticoagulation with Xarelto was recommended.  Her Chadsvasc score is 5. She subsequently underwent a 30 day event monitor which showed no arrhythmias. Lexiscan nuclear study 1/16 showed no ischemia and her ejection fraction had improved to 70%. CTA September 2018 showed ectatic ascending thoracic aorta measuring 3.7 cm. CTA April 2022 showed small dissection flap in the left proximal subclavian artery and she was asked to follow-up with vascular surgery. Patient seen in the emergency room April and May 2022 with recurrent atrial fibrillation.  She converted to sinus rhythm with Cardizem.  Echo 6/22 showed normal LV function. Since last seen, she denies increased palpitations, syncope or chest pain.  She has dyspnea with more vigorous activities but not routine activities.  No orthopnea, PND or pedal edema.  Current Outpatient Medications  Medication Sig Dispense Refill   acetaminophen (TYLENOL) 500 MG tablet Take 1,000 mg by mouth every 6 (six) hours as needed for moderate pain or fever (fever).      Apple Cider Vinegar 600 MG CAPS Take 600 mg by mouth daily.     atorvastatin (LIPITOR) 20 MG tablet Take 20 mg by mouth every evening.     B Complex Vitamins (B COMPLEX-B12 PO) Take 1 tablet by mouth daily.     Bilberry 1000 MG CAPS Take 1,000 mg by mouth daily.     Calcium Citrate-Vitamin D (CALCIUM + D PO) Take 600 mg by mouth daily.     Carboxymeth-Glyc-Polysorb PF (REFRESH OPTIVE MEGA-3) 0.5-1-0.5 % SOLN one drop in both eyes twice a day     cholecalciferol (VITAMIN D) 1000 units tablet Take 1 capsule by mouth daily.     Cinnamon 500 MG capsule Take 500 mg by mouth daily.     co-enzyme  Q-10 50 MG capsule Take 100 mg by mouth daily.     diltiazem (CARDIZEM) 30 MG tablet Take 1 tablet (30 mg total) by mouth 2 (two) times daily. 180 tablet 3   Flaxseed, Linseed, (FLAX SEED OIL) 1000 MG CAPS Take 1,000 mg by mouth daily.     levothyroxine (SYNTHROID) 88 MCG tablet Take 88 mcg by mouth daily before breakfast.     omega-3 acid ethyl esters (LOVAZA) 1 g capsule Take 1 g by mouth daily.     rivaroxaban (XARELTO) 15 MG TABS tablet Take 1 tablet (15 mg total) by mouth daily with supper. 30 tablet 3   traZODone (DESYREL) 50 MG tablet Take 50 mg by mouth at bedtime.     vitamin C (ASCORBIC ACID) 500 MG tablet Take 500 mg by mouth daily.      No current facility-administered medications for this visit.     Past Medical History:  Diagnosis Date   Ascending aortic aneurysm    3.8 x 4 cm on CTA 12/2013   CAD (coronary artery disease)    a. Chest CTA 01/16/14:  IMPRESSION: 1. Negative for pulmonary embolus or acute vascular abnormality. 2. Atherosclerosis and coronary artery disease. 3. Unchanged ascending aortic ectasia measuring 38 mm x 40 mm.  Recommend annual imaging followup by CTA or MRA   Cardiomyopathy [facility])    a.  Echocardiogram 12/3013:  Left ventricle:  Systolic function was mildly to moderately reduced. The estimated ejection fraction was in the range of 40% to 45%. Regional wall motion abnormalities cannot be excluded.  The study is not technically sufficient to allow evaluation of LV diastolic function.   DVT (deep venous thrombosis) (HCC)    History of anemia    Hx of cardiovascular stress test    Lexiscan Myoview (01/2014):  No ischemia or scar, EF 70%; Normal Study/Low Risk   Hypercholesteremia    Hypothyroidism    Left knee DJD    PAF (paroxysmal atrial fibrillation) (HCC)    PE (pulmonary embolism)    Pulmonary embolism (HCC)    Raynaud disease     Past Surgical History:  Procedure Laterality Date   ABDOMINAL HYSTERECTOMY  1973   FROZEN SHOULDER     KNEE  ARTHROSCOPY Left    SHOULDER ADHESION RELEASE Left 2000   TONSILLECTOMY  1961   TOTAL KNEE ARTHROPLASTY Left 12/25/2012   Procedure: TOTAL KNEE ARTHROPLASTY- left;  Surgeon: Lorn Junes, MD;  Location: [facility];  Service: Orthopedics;  Laterality: Left;    Social History   Socioeconomic History   Marital status: Widowed    Spouse name: Not on file   Number of children: Not on file   Years of education: Not on file   Highest education level: Not on file  Occupational History   Not on file  Tobacco Use   Smoking status: Never   Smokeless tobacco: Never  Vaping Use   Vaping Use: Never used  Substance and Sexual Activity   Alcohol use: No    Comment: Rare glass of wine   Drug use: No   Sexual activity: Not on file  Other Topics Concern   Not on file  Social History Narrative   Not on file   Social Determinants of Health   Financial Resource Strain: Not on file  Food Insecurity: Not on file  Transportation Needs: Not on file  Physical Activity: Not on file  Stress: Not on file  Social Connections: Not on file  Intimate Partner Violence: Not on file    Family History  Problem Relation Age of Onset   Cancer - Other Mother    Diabetes Father    Melanoma Sister    Heart disease Sister 57       CABG   Heart attack Sister    Stroke Neg Hx    Hypertension Neg Hx     ROS: no fevers or chills, productive cough, hemoptysis, dysphasia, odynophagia, melena, hematochezia, dysuria, hematuria, rash, seizure activity, orthopnea, PND, pedal edema, claudication. Remaining systems are negative.  Physical Exam: Well-developed well-nourished in no acute distress.  Skin is warm and dry.  HEENT is normal.  Neck is supple.  Chest is clear to auscultation with normal expansion.  Cardiovascular exam is regular rate and rhythm.  Abdominal exam nontender or distended. No masses palpated. Extremities show no edema. neuro grossly intact  A/P  1 paroxysmal atrial fibrillation-Pt  previously prescribed amiodarone but did not take due to concerns for side effects. Continue present dose of diltiazem and Xarelto.  Check hemoglobin and renal function.   2 coronary artery disease-based on coronary calcifications noted on CT scan.  Continue statin.   3 history of cardiomyopathy-felt tachycardia mediated.  LV function improved on FU echo.    4 history of pulmonary embolus-continue Xarelto.   5 hyperlipidemia-continue statin.  Check lipids and liver.   6 history of thoracic aortic aneurysm/dissection and subclavian-evaluated by  vascular surgery; medical therapy recommended.   Kirk Ruths, MD

## 2021-03-12 ENCOUNTER — Ambulatory Visit: Payer: Medicare HMO | Admitting: Cardiology

## 2021-03-12 ENCOUNTER — Other Ambulatory Visit: Payer: Self-pay

## 2021-03-12 ENCOUNTER — Encounter: Payer: Self-pay | Admitting: Cardiology

## 2021-03-12 VITALS — BP 138/74 | HR 64 | Ht 70.0 in | Wt 142.4 lb

## 2021-03-12 DIAGNOSIS — I7779 Dissection of other artery: Secondary | ICD-10-CM | POA: Diagnosis not present

## 2021-03-12 DIAGNOSIS — D6869 Other thrombophilia: Secondary | ICD-10-CM

## 2021-03-12 DIAGNOSIS — I2510 Atherosclerotic heart disease of native coronary artery without angina pectoris: Secondary | ICD-10-CM | POA: Diagnosis not present

## 2021-03-12 DIAGNOSIS — E7800 Pure hypercholesterolemia, unspecified: Secondary | ICD-10-CM

## 2021-03-12 DIAGNOSIS — I480 Paroxysmal atrial fibrillation: Secondary | ICD-10-CM | POA: Diagnosis not present

## 2021-03-12 NOTE — Patient Instructions (Signed)

## 2021-03-13 LAB — COMPREHENSIVE METABOLIC PANEL
ALT: 14 IU/L (ref 0–32)
AST: 25 IU/L (ref 0–40)
Albumin/Globulin Ratio: 2.1 (ref 1.2–2.2)
Albumin: 4.4 g/dL (ref 3.6–4.6)
Alkaline Phosphatase: 84 IU/L (ref 44–121)
BUN/Creatinine Ratio: 18 (ref 12–28)
BUN: 15 mg/dL (ref 8–27)
Bilirubin Total: 0.6 mg/dL (ref 0.0–1.2)
CO2: 28 mmol/L (ref 20–29)
Calcium: 9.5 mg/dL (ref 8.7–10.3)
Chloride: 102 mmol/L (ref 96–106)
Creatinine, Ser: 0.85 mg/dL (ref 0.57–1.00)
Globulin, Total: 2.1 g/dL (ref 1.5–4.5)
Glucose: 106 mg/dL — ABNORMAL HIGH (ref 70–99)
Potassium: 4.9 mmol/L (ref 3.5–5.2)
Sodium: 140 mmol/L (ref 134–144)
Total Protein: 6.5 g/dL (ref 6.0–8.5)
eGFR: 67 mL/min/{1.73_m2} (ref >59)

## 2021-03-13 LAB — CBC
Hematocrit: 41.5 % (ref 34.0–46.6)
Hemoglobin: 14 g/dL (ref 11.1–15.9)
MCH: 30.8 pg (ref 26.6–33.0)
MCHC: 33.7 g/dL (ref 31.5–35.7)
MCV: 91 fL (ref 79–97)
Platelets: 154 10*3/uL (ref 150–450)
RBC: 4.54 x10E6/uL (ref 3.77–5.28)
RDW: 12.7 % (ref 11.7–15.4)
WBC: 4.6 10*3/uL (ref 3.4–10.8)

## 2021-03-13 LAB — LIPID PANEL
Chol/HDL Ratio: 2 ratio (ref 0.0–4.4)
Cholesterol, Total: 141 mg/dL (ref 100–199)
HDL: 70 mg/dL (ref >39)
LDL Chol Calc (NIH): 57 mg/dL (ref 0–99)
Triglycerides: 69 mg/dL (ref 0–149)
VLDL Cholesterol Cal: 14 mg/dL (ref 5–40)

## 2021-04-05 ENCOUNTER — Other Ambulatory Visit: Payer: Self-pay | Admitting: Cardiology

## 2021-05-12 ENCOUNTER — Other Ambulatory Visit (HOSPITAL_COMMUNITY): Payer: Self-pay | Admitting: Physician Assistant

## 2021-06-01 ENCOUNTER — Other Ambulatory Visit: Payer: Self-pay | Admitting: Internal Medicine

## 2021-06-01 DIAGNOSIS — Z1231 Encounter for screening mammogram for malignant neoplasm of breast: Secondary | ICD-10-CM

## 2021-06-12 ENCOUNTER — Ambulatory Visit
Admission: RE | Admit: 2021-06-12 | Discharge: 2021-06-12 | Disposition: A | Discharge status: Home / Self Care | Payer: Medicare HMO | Source: Ambulatory Visit | Attending: Internal Medicine | Admitting: Internal Medicine

## 2021-06-12 DIAGNOSIS — Z1231 Encounter for screening mammogram for malignant neoplasm of breast: Secondary | ICD-10-CM

## 2021-06-13 ENCOUNTER — Emergency Department (HOSPITAL_BASED_OUTPATIENT_CLINIC_OR_DEPARTMENT_OTHER): Payer: Medicare HMO

## 2021-06-13 ENCOUNTER — Emergency Department (HOSPITAL_BASED_OUTPATIENT_CLINIC_OR_DEPARTMENT_OTHER)
Admission: EM | Admit: 2021-06-13 | Discharge: 2021-06-13 | Disposition: A | Discharge status: Home / Self Care | Payer: Medicare HMO | Attending: Emergency Medicine | Admitting: Emergency Medicine

## 2021-06-13 ENCOUNTER — Encounter (HOSPITAL_BASED_OUTPATIENT_CLINIC_OR_DEPARTMENT_OTHER): Payer: Self-pay | Admitting: Emergency Medicine

## 2021-06-13 ENCOUNTER — Other Ambulatory Visit: Payer: Self-pay

## 2021-06-13 ENCOUNTER — Emergency Department (HOSPITAL_BASED_OUTPATIENT_CLINIC_OR_DEPARTMENT_OTHER): Payer: Medicare HMO | Admitting: Radiology

## 2021-06-13 DIAGNOSIS — M545 Low back pain, unspecified: Secondary | ICD-10-CM | POA: Insufficient documentation

## 2021-06-13 DIAGNOSIS — Z7901 Long term (current) use of anticoagulants: Secondary | ICD-10-CM | POA: Diagnosis not present

## 2021-06-13 DIAGNOSIS — I4891 Unspecified atrial fibrillation: Secondary | ICD-10-CM | POA: Insufficient documentation

## 2021-06-13 DIAGNOSIS — R9431 Abnormal electrocardiogram [ECG] [EKG]: Secondary | ICD-10-CM | POA: Diagnosis not present

## 2021-06-13 DIAGNOSIS — M546 Pain in thoracic spine: Secondary | ICD-10-CM | POA: Insufficient documentation

## 2021-06-13 DIAGNOSIS — M542 Cervicalgia: Secondary | ICD-10-CM | POA: Diagnosis not present

## 2021-06-13 DIAGNOSIS — R7309 Other abnormal glucose: Secondary | ICD-10-CM | POA: Insufficient documentation

## 2021-06-13 DIAGNOSIS — R519 Headache, unspecified: Secondary | ICD-10-CM | POA: Insufficient documentation

## 2021-06-13 DIAGNOSIS — R0789 Other chest pain: Secondary | ICD-10-CM | POA: Diagnosis not present

## 2021-06-13 DIAGNOSIS — R079 Chest pain, unspecified: Secondary | ICD-10-CM | POA: Diagnosis not present

## 2021-06-13 LAB — CBG MONITORING, ED: Glucose-Capillary: 70 mg/dL (ref 70–99)

## 2021-06-13 MED ORDER — ACETAMINOPHEN 325 MG PO TABS
650.0000 mg | ORAL_TABLET | Freq: Once | ORAL | Status: AC
  Administered 2021-06-13: 650 mg via ORAL
  Filled 2021-06-13: qty 2

## 2021-06-13 NOTE — ED Notes (Signed)
Pt takes xarelto
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
## 2021-06-13 NOTE — Discharge Instructions (Signed)
If you develop new or worsening headache, vision changes, vomiting, neck pain, chest pain, shortness of breath, abdominal pain, or any other new/concerning symptoms then return to the ER for evaluation.
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
## 2021-06-17 DIAGNOSIS — I420 Dilated cardiomyopathy: Secondary | ICD-10-CM | POA: Diagnosis not present

## 2021-06-17 DIAGNOSIS — E785 Hyperlipidemia, unspecified: Secondary | ICD-10-CM | POA: Diagnosis not present

## 2021-07-03 ENCOUNTER — Ambulatory Visit (HOSPITAL_COMMUNITY): Payer: Medicare HMO | Admitting: Physician Assistant

## 2021-07-20 DIAGNOSIS — E039 Hypothyroidism, unspecified: Secondary | ICD-10-CM | POA: Diagnosis not present

## 2021-07-20 DIAGNOSIS — E785 Hyperlipidemia, unspecified: Secondary | ICD-10-CM | POA: Diagnosis not present

## 2021-07-20 DIAGNOSIS — I119 Hypertensive heart disease without heart failure: Secondary | ICD-10-CM | POA: Diagnosis not present

## 2021-07-28 DIAGNOSIS — F39 Unspecified mood [affective] disorder: Secondary | ICD-10-CM | POA: Diagnosis not present

## 2021-07-28 DIAGNOSIS — Z7901 Long term (current) use of anticoagulants: Secondary | ICD-10-CM | POA: Diagnosis not present

## 2021-07-28 DIAGNOSIS — R82998 Other abnormal findings in urine: Secondary | ICD-10-CM | POA: Diagnosis not present

## 2021-07-28 DIAGNOSIS — I25119 Atherosclerotic heart disease of native coronary artery with unspecified angina pectoris: Secondary | ICD-10-CM | POA: Diagnosis not present

## 2021-07-28 DIAGNOSIS — E039 Hypothyroidism, unspecified: Secondary | ICD-10-CM | POA: Diagnosis not present

## 2021-07-28 DIAGNOSIS — I119 Hypertensive heart disease without heart failure: Secondary | ICD-10-CM | POA: Diagnosis not present

## 2021-07-28 DIAGNOSIS — D692 Other nonthrombocytopenic purpura: Secondary | ICD-10-CM | POA: Diagnosis not present

## 2021-07-28 DIAGNOSIS — Z0000 Encounter for general adult medical examination without abnormal findings: Secondary | ICD-10-CM | POA: Diagnosis not present

## 2021-07-28 DIAGNOSIS — Z23 Encounter for immunization: Secondary | ICD-10-CM | POA: Diagnosis not present

## 2021-07-28 DIAGNOSIS — E785 Hyperlipidemia, unspecified: Secondary | ICD-10-CM | POA: Diagnosis not present

## 2021-07-28 DIAGNOSIS — I4891 Unspecified atrial fibrillation: Secondary | ICD-10-CM | POA: Diagnosis not present

## 2021-07-28 DIAGNOSIS — M8580 Other specified disorders of bone density and structure, unspecified site: Secondary | ICD-10-CM | POA: Diagnosis not present

## 2021-07-30 ENCOUNTER — Ambulatory Visit (HOSPITAL_COMMUNITY): Payer: Medicare HMO | Admitting: Physician Assistant

## 2021-08-13 DIAGNOSIS — H9193 Unspecified hearing loss, bilateral: Secondary | ICD-10-CM | POA: Diagnosis not present

## 2021-08-13 DIAGNOSIS — H6123 Impacted cerumen, bilateral: Secondary | ICD-10-CM | POA: Diagnosis not present

## 2021-09-03 ENCOUNTER — Ambulatory Visit (HOSPITAL_COMMUNITY)
Admission: RE | Admit: 2021-09-03 | Discharge: 2021-09-03 | Disposition: A | Discharge status: Home / Self Care | Payer: Medicare HMO | Source: Ambulatory Visit | Attending: Physician Assistant | Admitting: Physician Assistant

## 2021-09-03 ENCOUNTER — Other Ambulatory Visit (HOSPITAL_COMMUNITY): Payer: Self-pay | Admitting: Physician Assistant

## 2021-09-03 ENCOUNTER — Telehealth: Payer: Self-pay | Admitting: Cardiology

## 2021-09-03 ENCOUNTER — Encounter (HOSPITAL_COMMUNITY): Payer: Self-pay | Admitting: Physician Assistant

## 2021-09-03 VITALS — BP 128/82 | HR 74 | Ht 70.0 in | Wt 145.0 lb

## 2021-09-03 DIAGNOSIS — Z86711 Personal history of pulmonary embolism: Secondary | ICD-10-CM | POA: Diagnosis not present

## 2021-09-03 DIAGNOSIS — I2510 Atherosclerotic heart disease of native coronary artery without angina pectoris: Secondary | ICD-10-CM | POA: Diagnosis not present

## 2021-09-03 DIAGNOSIS — I480 Paroxysmal atrial fibrillation: Secondary | ICD-10-CM | POA: Diagnosis not present

## 2021-09-03 DIAGNOSIS — D6869 Other thrombophilia: Secondary | ICD-10-CM | POA: Diagnosis not present

## 2021-09-03 DIAGNOSIS — R0609 Other forms of dyspnea: Secondary | ICD-10-CM | POA: Diagnosis not present

## 2021-09-03 DIAGNOSIS — I429 Cardiomyopathy, unspecified: Secondary | ICD-10-CM | POA: Diagnosis not present

## 2021-09-03 DIAGNOSIS — Z7901 Long term (current) use of anticoagulants: Secondary | ICD-10-CM | POA: Insufficient documentation

## 2021-09-03 DIAGNOSIS — I4891 Unspecified atrial fibrillation: Secondary | ICD-10-CM

## 2021-09-03 DIAGNOSIS — E785 Hyperlipidemia, unspecified: Secondary | ICD-10-CM | POA: Insufficient documentation

## 2021-09-03 LAB — TSH: TSH: 0.682 u[IU]/mL (ref 0.350–4.500)

## 2021-09-03 LAB — CBC
HCT: 45 % (ref 36.0–46.0)
Hemoglobin: 14.4 g/dL (ref 12.0–15.0)
MCH: 31 pg (ref 26.0–34.0)
MCHC: 32 g/dL (ref 30.0–36.0)
MCV: 96.8 fL (ref 80.0–100.0)
Platelets: 192 10*3/uL (ref 150–400)
RBC: 4.65 MIL/uL (ref 3.87–5.11)
RDW: 13.7 % (ref 11.5–15.5)
WBC: 7.1 10*3/uL (ref 4.0–10.5)
nRBC: 0 % (ref 0.0–0.2)

## 2021-09-03 LAB — BRAIN NATRIURETIC PEPTIDE: B Natriuretic Peptide: 253.2 pg/mL — ABNORMAL HIGH (ref 0.0–100.0)

## 2021-09-03 NOTE — Telephone Encounter (Signed)
Pt c/o Shortness Of Breath: STAT if SOB developed within the last 24 hours or pt is noticeably SOB on the phone  1. Are you currently SOB (can you hear that pt is SOB on the phone)? No  2. How long have you been experiencing SOB?  Since Monday 3. Are you SOB when sitting or when up moving around? Moving around  4. Are you currently experiencing any other symptoms? No

## 2021-09-03 NOTE — Telephone Encounter (Signed)
Patient stated she is in afib. She has an appointment to day with C. Fenton, [place]. She gets lightheaded and sob when standing or ambulating. Stated her BP, P, and O2 sat were normal yesterday. She didn't want to check them now. Discussed fall precautions with her. Her daughter will take her to appointment today.

## 2021-09-03 NOTE — Progress Notes (Signed)
Primary Care Physician: Prince Solian, MD Primary Cardiologist: Dr Stanford Breed  Primary Electrophysiologist: none Referring Physician: [facility]   Whitney Medina is a 88 y.o. female with a history of CAD, prior PE, HLD, systolic dysfunction, and atrial fibrillation who presents for follow up in the [facility]. She was admitted December 2015 with a syncopal episode and found to be in AFib with RVR.  EF was noted to be 40-45% (possibly related to tachycardia). She was placed on beta blocker.  Long term anticoagulation with Xarelto was recommended. She subsequently underwent a 30 day event monitor which showed no arrhythmias. Patient is on Xarelto for a CHADS2VASC score of 5. She has been seen twice at the ED on 05/15/20 and 05/21/20. She was sent to ED from PCP for rapid rates and converted to SR with diltiazem. She was prescribed diltiazem at discharge.   On follow up today, patient reports that on Monday 08/31/21 she noticed more dyspnea on exertion. She also had some intermittent dizziness. These symptoms were similar to some of her previous afib episodes. She is in SR today on ECG but is still experiencing the dyspnea. She denies any weight can, edema, or orthopnea. No chest pain. She does admit to occasional rectal bleeding with longstanding history of hemorrhoids.    Today, she denies symptoms of palpitations, chest pain, orthopnea, PND, lower extremity edema, presyncope, syncope, snoring, daytime somnolence, bleeding, or neurologic sequela. The patient is tolerating medications without difficulties and is otherwise without complaint today.    Atrial Fibrillation Risk Factors:  she does not have symptoms or diagnosis of sleep apnea. she does not have a history of rheumatic fever.   she has a BMI of Body mass index is 20.81 kg/m.Marland Kitchen Filed Weights   09/03/21 1345  Weight: 65.8 kg    Family History  Problem Relation Age of Onset   Cancer - Other  Mother    Diabetes Father    Melanoma Sister    Heart disease Sister 49       CABG   Heart attack Sister    Stroke Neg Hx    Hypertension Neg Hx      Atrial Fibrillation Management history:  Previous antiarrhythmic drugs: amiodarone  Previous cardioversions: none Previous ablations: none CHADS2VASC score: 5 Anticoagulation history: Xarelto    Past Medical History:  Diagnosis Date   Ascending aortic aneurysm (HCC)    3.8 x 4 cm on CTA 12/2013   CAD (coronary artery disease)    a. Chest CTA 01/16/14:  IMPRESSION: 1. Negative for pulmonary embolus or acute vascular abnormality. 2. Atherosclerosis and coronary artery disease. 3. Unchanged ascending aortic ectasia measuring 38 mm x 40 mm.  Recommend annual imaging followup by CTA or MRA   Cardiomyopathy [facility])    a.  Echocardiogram 12/3013:  Left ventricle: Systolic function was mildly to moderately reduced. The estimated ejection fraction was in the range of 40% to 45%. Regional wall motion abnormalities cannot be excluded.  The study is not technically sufficient to allow evaluation of LV diastolic function.   DVT (deep venous thrombosis) (HCC)    History of anemia    Hx of cardiovascular stress test    Lexiscan Myoview (01/2014):  No ischemia or scar, EF 70%; Normal Study/Low Risk   Hypercholesteremia    Hypothyroidism    Left knee DJD    PAF (paroxysmal atrial fibrillation) (HCC)    PE (pulmonary embolism)    Pulmonary embolism ([place])  Raynaud disease    Past Surgical History:  Procedure Laterality Date   ABDOMINAL HYSTERECTOMY  1973   FROZEN SHOULDER     KNEE ARTHROSCOPY Left    SHOULDER ADHESION RELEASE Left 2000   TONSILLECTOMY  1961   TOTAL KNEE ARTHROPLASTY Left 12/25/2012   Procedure: TOTAL KNEE ARTHROPLASTY- left;  Surgeon: Lorn Junes, MD;  Location: [facility];  Service: Orthopedics;  Laterality: Left;    Current Outpatient Medications  Medication Sig Dispense Refill   acetaminophen (TYLENOL) 500 MG tablet  Take 1,000 mg by mouth every 6 (six) hours as needed for moderate pain or fever (fever).      Apple Cider Vinegar 600 MG CAPS Take 600 mg by mouth daily.     atorvastatin (LIPITOR) 20 MG tablet Take 20 mg by mouth every evening.     B Complex Vitamins (B COMPLEX-B12 PO) Take 1 tablet by mouth daily.     Bilberry 1000 MG CAPS Take 1,000 mg by mouth daily.     Calcium Citrate-Vitamin D (CALCIUM + D PO) Take 600 mg by mouth daily.     Carboxymeth-Glyc-Polysorb PF (REFRESH OPTIVE MEGA-3) 0.5-1-0.5 % SOLN one drop in both eyes twice a day     cholecalciferol (VITAMIN D) 1000 units tablet Take 1 capsule by mouth daily.     Cinnamon 500 MG capsule Take 500 mg by mouth daily.     co-enzyme Q-10 50 MG capsule Take 100 mg by mouth daily.     diltiazem (CARDIZEM) 30 MG tablet TAKE 1 TABLET TWICE DAILY 180 tablet 3   Flaxseed, Linseed, (FLAX SEED OIL) 1000 MG CAPS Take 1,000 mg by mouth daily.     levothyroxine (SYNTHROID) 88 MCG tablet Take 88 mcg by mouth daily before breakfast.     omega-3 acid ethyl esters (LOVAZA) 1 g capsule Take 1 g by mouth daily.     traZODone (DESYREL) 50 MG tablet Take 50 mg by mouth at bedtime.     vitamin C (ASCORBIC ACID) 500 MG tablet Take 500 mg by mouth daily.      XARELTO 15 MG TABS tablet TAKE 1 TABLET (15 MG TOTAL) BY MOUTH DAILY WITH SUPPER 30 tablet 11   No current facility-administered medications for this encounter.    Allergies  Allergen Reactions   Codeine Nausea And Vomiting    Percocet OK   Crestor [Rosuvastatin] Nausea Only    Social History   Socioeconomic History   Marital status: Widowed    Spouse name: Not on file   Number of children: Not on file   Years of education: Not on file   Highest education level: Not on file  Occupational History   Not on file  Tobacco Use   Smoking status: Never   Smokeless tobacco: Never   Tobacco comments:    Never smoke 09/03/21  Vaping Use   Vaping Use: Never used  Substance and Sexual Activity    Alcohol use: No    Comment: Rare glass of wine   Drug use: No   Sexual activity: Not on file  Other Topics Concern   Not on file  Social History Narrative   Not on file   Social Determinants of Health   Financial Resource Strain: Not on file  Food Insecurity: Not on file  Transportation Needs: Not on file  Physical Activity: Not on file  Stress: Not on file  Social Connections: Not on file  Intimate Partner Violence: Not on file  ROS- All systems are reviewed and negative except as per the HPI above.  Physical Exam: Vitals:   09/03/21 1345  BP: 128/82  Pulse: 74  Weight: 65.8 kg  Height: '5\' 10"'$  (1.778 m)     GEN- The patient is a well appearing elderly female, alert and oriented x 3 today.   HEENT-head normocephalic, atraumatic, sclera clear, conjunctiva pink, hearing intact, trachea midline. Lungs- Clear to ausculation bilaterally, normal work of breathing Heart- Regular rate and rhythm, no murmurs, rubs or gallops  GI- soft, NT, ND, + BS Extremities- no clubbing, cyanosis, or edema MS- no significant deformity or atrophy Skin- no rash or lesion Psych- euthymic mood, full affect Neuro- strength and sensation are intact   Wt Readings from Last 3 Encounters:  09/03/21 65.8 kg  03/12/21 64.6 kg  01/02/21 65.1 kg    EKG today demonstrates  SR Vent. rate 74 BPM PR interval 154 ms QRS duration 66 ms QT/QTcB 356/395 ms  Echo 07/01/20 demonstrated   1. Left ventricular ejection fraction, by estimation, is 60 to 65%. The  left ventricle has normal function. The left ventricle has no regional  wall motion abnormalities. Left ventricular diastolic parameters are  indeterminate.   2. Right ventricular systolic function is normal. The right ventricular  size is normal.   3. The mitral valve is normal in structure. Trivial mitral valve  regurgitation.   4. The aortic valve is tricuspid. There is moderate calcification of the  aortic valve. Aortic valve  regurgitation is not visualized.    Epic records are reviewed at length today  CHA2DS2-VASc Score = 5  The patient's score is based upon: CHF History: 1 HTN History: 0 Diabetes History: 0 Stroke History: 0 Vascular Disease History: 1 Age Score: 2 Gender Score: 1       ASSESSMENT AND PLAN: 1. Paroxysmal Atrial Fibrillation (ICD10:  I48.0) The patient's CHA2DS2-VASc score is 5, indicating a 7.2% annual risk of stroke.   Patient in [place] today.   Will have her wear a 2 week Zio monitor to evaluate arrhythmia burden. It is possible she has have paroxysms of symptomatic afib.  If needed, would consider amiodarone or dofetilide for rhythm control.  Continue diltiazem 30 mg BID Continue Xarelto 15 mg daily  2. Secondary Hypercoagulable State (ICD10:  D68.69) The patient is at significant risk for stroke/thromboembolism based upon her CHA2DS2-VASc Score of 5.  Continue Rivaroxaban (Xarelto).   3. CAD Coronary calcifications noted on CT. No changes on ECG, no CP.  4. Cardiomyopathy Noted in 2015, suspected tachycardia mediated given recovery on stress testing.  EF 60-65% on echo 07/01/20 Does not appear acutely fluid overloaded, will check BNP to rule out.   5. Dyspnea on exertion Unclear etiology, Zio monitor as above. With h/o hemorrhoidal bleeding, will check CBC Check TSH/BNP as well.    Follow up in the AF clinic in 3 weeks.    [facility] [address] [place], [place] [address] [phone] 09/03/2021 2:33 PM

## 2021-09-24 DIAGNOSIS — I4891 Unspecified atrial fibrillation: Secondary | ICD-10-CM | POA: Diagnosis not present

## 2021-09-25 NOTE — Addendum Note (Signed)
Encounter addended by: Juluis Mire, RN on: 09/25/2021 8:54 AM  Actions taken: Imaging Exam ended

## 2021-09-28 ENCOUNTER — Ambulatory Visit (HOSPITAL_COMMUNITY)
Admission: RE | Admit: 2021-09-28 | Discharge: 2021-09-28 | Disposition: A | Discharge status: Home / Self Care | Payer: Medicare HMO | Source: Ambulatory Visit | Attending: Physician Assistant | Admitting: Physician Assistant

## 2021-09-28 VITALS — BP 126/66 | HR 77 | Ht 70.0 in | Wt 146.8 lb

## 2021-09-28 DIAGNOSIS — I429 Cardiomyopathy, unspecified: Secondary | ICD-10-CM | POA: Diagnosis not present

## 2021-09-28 DIAGNOSIS — I2510 Atherosclerotic heart disease of native coronary artery without angina pectoris: Secondary | ICD-10-CM | POA: Insufficient documentation

## 2021-09-28 DIAGNOSIS — Z79899 Other long term (current) drug therapy: Secondary | ICD-10-CM | POA: Diagnosis not present

## 2021-09-28 DIAGNOSIS — Z7902 Long term (current) use of antithrombotics/antiplatelets: Secondary | ICD-10-CM | POA: Diagnosis not present

## 2021-09-28 DIAGNOSIS — I5020 Unspecified systolic (congestive) heart failure: Secondary | ICD-10-CM | POA: Insufficient documentation

## 2021-09-28 DIAGNOSIS — E785 Hyperlipidemia, unspecified: Secondary | ICD-10-CM | POA: Diagnosis not present

## 2021-09-28 DIAGNOSIS — Z79890 Hormone replacement therapy (postmenopausal): Secondary | ICD-10-CM | POA: Insufficient documentation

## 2021-09-28 DIAGNOSIS — D6869 Other thrombophilia: Secondary | ICD-10-CM | POA: Insufficient documentation

## 2021-09-28 DIAGNOSIS — I480 Paroxysmal atrial fibrillation: Secondary | ICD-10-CM | POA: Diagnosis not present

## 2021-09-28 DIAGNOSIS — Z86711 Personal history of pulmonary embolism: Secondary | ICD-10-CM | POA: Diagnosis not present

## 2021-09-28 DIAGNOSIS — R0600 Dyspnea, unspecified: Secondary | ICD-10-CM | POA: Insufficient documentation

## 2021-09-28 DIAGNOSIS — E039 Hypothyroidism, unspecified: Secondary | ICD-10-CM | POA: Diagnosis not present

## 2021-09-28 DIAGNOSIS — Z8616 Personal history of COVID-19: Secondary | ICD-10-CM | POA: Diagnosis not present

## 2021-09-28 DIAGNOSIS — Z7901 Long term (current) use of anticoagulants: Secondary | ICD-10-CM | POA: Insufficient documentation

## 2021-09-28 NOTE — Progress Notes (Signed)
Primary Care Physician: Prince Solian, MD Primary Cardiologist: Dr Stanford Breed  Primary Electrophysiologist: none Referring Physician: [facility]   Whitney Medina is a 88 y.o. female with a history of CAD, SVT, prior PE, HLD, systolic dysfunction, and atrial fibrillation who presents for follow up in the [facility]. She was admitted December 2015 with a syncopal episode and found to be in AFib with RVR.  EF was noted to be 40-45% (possibly related to tachycardia). She was placed on beta blocker.  Long term anticoagulation with Xarelto was recommended. She subsequently underwent a 30 day event monitor which showed no arrhythmias. Patient is on Xarelto for a CHADS2VASC score of 5. She has been seen twice at the ED on 05/15/20 and 05/21/20. She was sent to ED from PCP for rapid rates and converted to SR with diltiazem. She was prescribed diltiazem at discharge.   On follow up today, patient reports that she is "a little better" but still has dyspnea. Zio monitor showed no afib episodes, multiple runs of brief SVT, longest 13 seconds. On further questioning, patient reports that her dyspnea started 1-2 weeks after having COVID.   Today, she denies symptoms of palpitations, chest pain, orthopnea, PND, lower extremity edema, presyncope, syncope, snoring, daytime somnolence, bleeding, or neurologic sequela. The patient is tolerating medications without difficulties and is otherwise without complaint today.    Atrial Fibrillation Risk Factors:  she does not have symptoms or diagnosis of sleep apnea. she does not have a history of rheumatic fever.   she has a BMI of Body mass index is 21.06 kg/m.Marland Kitchen Filed Weights   09/28/21 1422  Weight: 66.6 kg    Family History  Problem Relation Age of Onset   Cancer - Other Mother    Diabetes Father    Melanoma Sister    Heart disease Sister 43       CABG   Heart attack Sister    Stroke Neg Hx    Hypertension Neg  Hx      Atrial Fibrillation Management history:  Previous antiarrhythmic drugs: amiodarone  Previous cardioversions: none Previous ablations: none CHADS2VASC score: 5 Anticoagulation history: Xarelto    Past Medical History:  Diagnosis Date   Ascending aortic aneurysm (HCC)    3.8 x 4 cm on CTA 12/2013   CAD (coronary artery disease)    a. Chest CTA 01/16/14:  IMPRESSION: 1. Negative for pulmonary embolus or acute vascular abnormality. 2. Atherosclerosis and coronary artery disease. 3. Unchanged ascending aortic ectasia measuring 38 mm x 40 mm.  Recommend annual imaging followup by CTA or MRA   Cardiomyopathy [facility])    a.  Echocardiogram 12/3013:  Left ventricle: Systolic function was mildly to moderately reduced. The estimated ejection fraction was in the range of 40% to 45%. Regional wall motion abnormalities cannot be excluded.  The study is not technically sufficient to allow evaluation of LV diastolic function.   DVT (deep venous thrombosis) (HCC)    History of anemia    Hx of cardiovascular stress test    Lexiscan Myoview (01/2014):  No ischemia or scar, EF 70%; Normal Study/Low Risk   Hypercholesteremia    Hypothyroidism    Left knee DJD    PAF (paroxysmal atrial fibrillation) (HCC)    PE (pulmonary embolism)    Pulmonary embolism (HCC)    Raynaud disease    Past Surgical History:  Procedure Laterality Date   ABDOMINAL HYSTERECTOMY  1973   FROZEN SHOULDER  KNEE ARTHROSCOPY Left    SHOULDER ADHESION RELEASE Left 2000   TONSILLECTOMY  1961   TOTAL KNEE ARTHROPLASTY Left 12/25/2012   Procedure: TOTAL KNEE ARTHROPLASTY- left;  Surgeon: Lorn Junes, MD;  Location: [place];  Service: Orthopedics;  Laterality: Left;    Current Outpatient Medications  Medication Sig Dispense Refill   acetaminophen (TYLENOL) 500 MG tablet Take 1,000 mg by mouth every 6 (six) hours as needed for moderate pain or fever (fever).      Apple Cider Vinegar 600 MG CAPS Take 600 mg by mouth  daily.     atorvastatin (LIPITOR) 20 MG tablet Take 20 mg by mouth every evening.     B Complex Vitamins (B COMPLEX-B12 PO) Take 1 tablet by mouth daily.     Calcium Citrate-Vitamin D (CALCIUM + D PO) Take 600 mg by mouth daily.     Carboxymeth-Glyc-Polysorb PF (REFRESH OPTIVE MEGA-3) 0.5-1-0.5 % SOLN one drop in both eyes twice a day     cholecalciferol (VITAMIN D) 1000 units tablet Take 1 capsule by mouth daily.     Cinnamon 500 MG capsule Take 500 mg by mouth daily.     co-enzyme Q-10 50 MG capsule Take 100 mg by mouth daily.     diltiazem (CARDIZEM) 30 MG tablet TAKE 1 TABLET TWICE DAILY 180 tablet 3   Flaxseed, Linseed, (FLAX SEED OIL) 1000 MG CAPS Take 1,000 mg by mouth daily.     levothyroxine (SYNTHROID) 88 MCG tablet Take 88 mcg by mouth daily before breakfast.     omega-3 acid ethyl esters (LOVAZA) 1 g capsule Take 1 g by mouth daily.     traZODone (DESYREL) 50 MG tablet Take 50 mg by mouth at bedtime.     vitamin C (ASCORBIC ACID) 500 MG tablet Take 500 mg by mouth daily.      XARELTO 15 MG TABS tablet TAKE 1 TABLET (15 MG TOTAL) BY MOUTH DAILY WITH SUPPER 30 tablet 11   No current facility-administered medications for this encounter.    Allergies  Allergen Reactions   Codeine Nausea And Vomiting    Percocet OK   Crestor [Rosuvastatin] Nausea Only    Social History   Socioeconomic History   Marital status: Widowed    Spouse name: Not on file   Number of children: Not on file   Years of education: Not on file   Highest education level: Not on file  Occupational History   Not on file  Tobacco Use   Smoking status: Never   Smokeless tobacco: Never   Tobacco comments:    Never smoke 09/03/21  Vaping Use   Vaping Use: Never used  Substance and Sexual Activity   Alcohol use: No    Comment: Rare glass of wine   Drug use: No   Sexual activity: Not on file  Other Topics Concern   Not on file  Social History Narrative   Not on file   Social Determinants of Health    Financial Resource Strain: Not on file  Food Insecurity: Not on file  Transportation Needs: Not on file  Physical Activity: Not on file  Stress: Not on file  Social Connections: Not on file  Intimate Partner Violence: Not on file     ROS- All systems are reviewed and negative except as per the HPI above.  Physical Exam: Vitals:   09/28/21 1422  Weight: 66.6 kg  Height: '5\' 10"'$  (1.778 m)    GEN- The patient is a well  appearing elderly female, alert and oriented x 3 today.   HEENT-head normocephalic, atraumatic, sclera clear, conjunctiva pink, hearing intact, trachea midline. Lungs- Clear to ausculation bilaterally, normal work of breathing Heart- Regular rate and rhythm, no murmurs, rubs or gallops  GI- soft, NT, ND, + BS Extremities- no clubbing, cyanosis, or edema MS- no significant deformity or atrophy Skin- no rash or lesion Psych- euthymic mood, full affect Neuro- strength and sensation are intact   Wt Readings from Last 3 Encounters:  09/28/21 66.6 kg  09/03/21 65.8 kg  03/12/21 64.6 kg    EKG today demonstrates  SR Vent. rate 77 BPM PR interval 150 ms QRS duration 70 ms QT/QTcB 356/402 ms  Echo 07/01/20 demonstrated   1. Left ventricular ejection fraction, by estimation, is 60 to 65%. The  left ventricle has normal function. The left ventricle has no regional  wall motion abnormalities. Left ventricular diastolic parameters are  indeterminate.   2. Right ventricular systolic function is normal. The right ventricular  size is normal.   3. The mitral valve is normal in structure. Trivial mitral valve  regurgitation.   4. The aortic valve is tricuspid. There is moderate calcification of the  aortic valve. Aortic valve regurgitation is not visualized.    Epic records are reviewed at length today  CHA2DS2-VASc Score = 5  The patient's score is based upon: CHF History: 1 HTN History: 0 Diabetes History: 0 Stroke History: 0 Vascular Disease History:  1 Age Score: 2 Gender Score: 1       ASSESSMENT AND PLAN: 1. Paroxysmal Atrial Fibrillation (ICD10:  I48.0) The patient's CHA2DS2-VASc score is 5, indicating a 7.2% annual risk of stroke.   Zio monitor showed no afib episodes. Did have multiple brief runs of SVT. Continue diltiazem 30 mg BID Continue Xarelto 15 mg daily If needed, would consider amiodarone or dofetilide for rhythm control.   2. Secondary Hypercoagulable State (ICD10:  D68.69) The patient is at significant risk for stroke/thromboembolism based upon her CHA2DS2-VASc Score of 5.  Continue Rivaroxaban (Xarelto).   3. CAD Coronary calcifications noted on CT. Offered stress testing to evaluate for ischemia, patient declined today.  4. Cardiomyopathy Noted in 2015, suspected tachycardia mediated given recovery on stress testing.  EF 60-65% on echo 07/01/20 Fluid status appears stable. BNP has been chronically elevated for many years.   5. Dyspnea on exertion Unclear etiology, does not appear related to afib.  Offered stress testing, patient declined, would like to speak to primary cardiology team first.  ? Long COVID   Follow up with Dr Stanford Breed or APP. AF clinic in 6 months.    [facility] [address] [place], [place] [address] [phone] 09/28/2021 2:28 PM

## 2021-10-29 ENCOUNTER — Ambulatory Visit: Payer: Medicare HMO | Attending: Nurse Practitioner | Admitting: Nurse Practitioner

## 2021-10-29 ENCOUNTER — Encounter: Payer: Self-pay | Admitting: Nurse Practitioner

## 2021-10-29 VITALS — BP 122/58 | HR 75 | Ht 70.0 in | Wt 143.0 lb

## 2021-10-29 DIAGNOSIS — I2510 Atherosclerotic heart disease of native coronary artery without angina pectoris: Secondary | ICD-10-CM

## 2021-10-29 DIAGNOSIS — I7779 Dissection of other artery: Secondary | ICD-10-CM | POA: Diagnosis not present

## 2021-10-29 DIAGNOSIS — E782 Mixed hyperlipidemia: Secondary | ICD-10-CM | POA: Diagnosis not present

## 2021-10-29 DIAGNOSIS — R5383 Other fatigue: Secondary | ICD-10-CM

## 2021-10-29 DIAGNOSIS — Z86711 Personal history of pulmonary embolism: Secondary | ICD-10-CM

## 2021-10-29 DIAGNOSIS — E785 Hyperlipidemia, unspecified: Secondary | ICD-10-CM

## 2021-10-29 DIAGNOSIS — Z8679 Personal history of other diseases of the circulatory system: Secondary | ICD-10-CM

## 2021-10-29 DIAGNOSIS — I480 Paroxysmal atrial fibrillation: Secondary | ICD-10-CM

## 2021-10-29 DIAGNOSIS — I7121 Aneurysm of the ascending aorta, without rupture: Secondary | ICD-10-CM | POA: Diagnosis not present

## 2021-10-29 MED ORDER — FUROSEMIDE 20 MG PO TABS: ORAL_TABLET | ORAL | 3 refills | Status: DC

## 2021-10-29 NOTE — Progress Notes (Signed)
Office Visit    Patient Name: Whitney Medina Date of Encounter: 10/29/2021  Primary Care Provider:  Prince Solian, MD Primary Cardiologist:  Kirk Ruths, MD  Chief Complaint    88 year old female with a history of paroxysmal atrial fibrillation, CAD (coronary calcification noted on CT), cardiomyopathy (suspected tachy-mediated), thoracic aortic aneurysm, PE/DVT, hyperlipidemia, and Raynaud's phenomenon who present for follow-up related to atrial fibrillation.   Past Medical History    Past Medical History:  Diagnosis Date   Ascending aortic aneurysm (HCC)    3.8 x 4 cm on CTA 12/2013   CAD (coronary artery disease)    a. Chest CTA 01/16/14:  IMPRESSION: 1. Negative for pulmonary embolus or acute vascular abnormality. 2. Atherosclerosis and coronary artery disease. 3. Unchanged ascending aortic ectasia measuring 38 mm x 40 mm.  Recommend annual imaging followup by CTA or MRA   Cardiomyopathy [facility])    a.  Echocardiogram 12/3013:  Left ventricle: Systolic function was mildly to moderately reduced. The estimated ejection fraction was in the range of 40% to 45%. Regional wall motion abnormalities cannot be excluded.  The study is not technically sufficient to allow evaluation of LV diastolic function.   DVT (deep venous thrombosis) (HCC)    History of anemia    Hx of cardiovascular stress test    Lexiscan Myoview (01/2014):  No ischemia or scar, EF 70%; Normal Study/Low Risk   Hypercholesteremia    Hypothyroidism    Left knee DJD    PAF (paroxysmal atrial fibrillation) (HCC)    PE (pulmonary embolism)    Pulmonary embolism (HCC)    Raynaud disease    Past Surgical History:  Procedure Laterality Date   ABDOMINAL HYSTERECTOMY  1973   FROZEN SHOULDER     KNEE ARTHROSCOPY Left    SHOULDER ADHESION RELEASE Left 2000   TONSILLECTOMY  1961   TOTAL KNEE ARTHROPLASTY Left 12/25/2012   Procedure: TOTAL KNEE ARTHROPLASTY- left;  Surgeon: Lorn Junes, MD;  Location: [place];   Service: Orthopedics;  Laterality: Left;    Allergies  Allergies  Allergen Reactions   Codeine Nausea And Vomiting    Percocet OK   Crestor [Rosuvastatin] Nausea Only    History of Present Illness    87 year old female with the above past medical history including paroxysmal atrial fibrillation, CAD (coronary calcification noted on CT), cardiomyopathy (suspected tachy-mediated), thoracic aortic aneurysm, PE/DVT, hyperlipidemia, and Raynaud's phenomenon.  She was hospitalized in December 2015 following a syncopal episode.  She was noted to be in atrial fibrillation with RVR.  Echocardiogram at the time showed EF 40 to 45% (suspected tachy-mediated cardiomyopathy).  It was questioned whether or not her syncope was related to posttermination pauses.  She was started on beta-blocker therapy.  30-day event monitor showed no arrhythmias.  Nuclear study in 2016 showed no evidence of ischemia, EF improved to 70%.  CTA in September 2018 showed ectatic ascending thoracic aorta measuring 3.7 cm.  CT in April 2022 showed small dissection flap in the left proximal subclavian artery.  She was evaluated by vascular surgery, medical therapy was recommended.  She was seen in the ED in room April and May 2022 with recurrent atrial fibrillation.  She converted to sinus rhythm with diltiazem.  Echocardiogram in June 2022 showed normal LV function, EF 60-65%.  She follows in the A-fib clinic.  Repeat cardiac monitor in 09/2021 to assess A-fib burden revealed predominately sinus rhythm, multiple runs of SVT, longest lasting 13 seconds, no episodes of atrial fibrillation.  She was last seen in the A-fib clinic on 09/28/2021 and was stable from a cardiac standpoint.  She did note some dyspnea on exertion, which followed COVID-19 infection.  Nuclear stress test was discussed, however, patient declined.  She presents today for follow-up.  Since her last visit she has been stable overall from a cardiac standpoint.  She has  noted since her COVID infection in July 2023 that she has had generalized weakness, fatigue.  She has chronic intermittent dizziness, this is unchanged.  She has noted occasional dark tarry stools, however, she denies any frank bleeding.  She denies any chest pain, dyspnea, palpitations, edema, PND, orthopnea, weight gain. Other than her ongoing fatigue, she denies any additional concerns today.  Home Medications    Current Outpatient Medications  Medication Sig Dispense Refill   acetaminophen (TYLENOL) 500 MG tablet Take 1,000 mg by mouth every 6 (six) hours as needed for moderate pain or fever (fever).      Apple Cider Vinegar 600 MG CAPS Take 600 mg by mouth daily.     atorvastatin (LIPITOR) 20 MG tablet Take 20 mg by mouth every evening.     B Complex Vitamins (B COMPLEX-B12 PO) Take 1 tablet by mouth daily.     Calcium Citrate-Vitamin D (CALCIUM + D PO) Take 600 mg by mouth daily.     Carboxymeth-Glyc-Polysorb PF (REFRESH OPTIVE MEGA-3) 0.5-1-0.5 % SOLN one drop in both eyes twice a day     cholecalciferol (VITAMIN D) 1000 units tablet Take 1 capsule by mouth daily.     Cinnamon 500 MG capsule Take 500 mg by mouth daily.     diltiazem (CARDIZEM) 30 MG tablet TAKE 1 TABLET TWICE DAILY 180 tablet 3   levothyroxine (SYNTHROID) 88 MCG tablet Take 88 mcg by mouth daily before breakfast.     omega-3 acid ethyl esters (LOVAZA) 1 g capsule Take 1 g by mouth daily.     traZODone (DESYREL) 50 MG tablet Take 50 mg by mouth at bedtime.     vitamin C (ASCORBIC ACID) 500 MG tablet Take 500 mg by mouth daily.      XARELTO 15 MG TABS tablet TAKE 1 TABLET (15 MG TOTAL) BY MOUTH DAILY WITH SUPPER 30 tablet 11   No current facility-administered medications for this visit.     Review of Systems    She denies chest pain, palpitations, dyspnea, pnd, orthopnea, n, v, dizziness, syncope, edema, weight gain, or early satiety. All other systems reviewed and are otherwise negative except as noted  above.  Physical Exam    VS:  BP (!) 122/58 (BP Location: Left Arm, Patient Position: Sitting, Cuff Size: Normal)   Pulse 75   Ht '5\' 10"'$  (1.778 m)   Wt 143 lb (64.9 kg)   BMI 20.52 kg/m   GEN: Well nourished, well developed, in no acute distress. HEENT: normal. Neck: Supple, no JVD, carotid bruits, or masses. Cardiac: RRR, no murmurs, rubs, or gallops. No clubbing, cyanosis, edema.  Radials/DP/PT 2+ and equal bilaterally.  Respiratory:  Respirations regular and unlabored, clear to auscultation bilaterally. GI: Soft, nontender, nondistended, BS + x 4. MS: no deformity or atrophy. Skin: warm and dry, no rash. Neuro:  Strength and sensation are intact. Psych: Normal affect.  Accessory Clinical Findings    ECG personally reviewed by me today - No EKG in office today.     Lab Results  Component Value Date   WBC 7.1 09/03/2021   HGB 14.4 09/03/2021   HCT 45.0  09/03/2021   MCV 96.8 09/03/2021   PLT 192 09/03/2021   Lab Results  Component Value Date   CREATININE 0.85 03/12/2021   BUN 15 03/12/2021   NA 140 03/12/2021   K 4.9 03/12/2021   CL 102 03/12/2021   CO2 28 03/12/2021   Lab Results  Component Value Date   ALT 14 03/12/2021   AST 25 03/12/2021   ALKPHOS 84 03/12/2021   BILITOT 0.6 03/12/2021   Lab Results  Component Value Date   CHOL 141 03/12/2021   HDL 70 03/12/2021   LDLCALC 57 03/12/2021   TRIG 69 03/12/2021   CHOLHDL 2.0 03/12/2021    No results found for: "HGBA1C"  Assessment & Plan    1. Generalized fatigue: Since her COVID infection in July 2023, she has noted increased fatigue.  She denies any chest pain or dyspnea. She has noted occasional dark tarry stools, denies any frank bleeding.  Discussed ischemic evaluation with possible nuclear stress test, however, patient declines at this time.  Will check CBC to rule out anemia.  It is possible that her symptoms are sequelae of recent COVID-19 infection.  Recent echocardiogram and cardiac monitor  reassuring.  Continue to monitor.  2. Paroxysmal atrial fibrillation: Recent cardiac monitor in September 2023 showed predominantly sinus rhythm, multiple runs of asymptomatic SVT, no episodes of atrial fibrillation.  Maintaining NSR. She denies any recent palpitations, dyspnea.  Continue diltiazem, Xarelto.  3. CAD: Coronary artery calcification noted on prior CT. Nuclear study in 2016 showed no evidence of ischemia, EF 70%.   She denies any symptoms concerning for angina though she does note some generalized fatigue.  Recent echo reassuring.  Declines further ischemic evaluation at this time.  Continue to monitor symptoms.  Continue Lovaza, Lipitor.  4. History of cardiomyopathy: Prior EF 40 to 45%, suspected to be tachy-mediated.  Most recent echo in June 2022 showed normal LV function, EF 60-65%.  BP is chronically elevated.  Euvolemic and well compensated on exam. Stable.   5. Thoracic aortic aneurysm/subclavian dissection: She was evaluated by vascular surgery, medical management was recommended.  She has not had a CT since 04/2020.  Will repeat CT angio chest/abdomen/pelvis for continued monitoring.  6. History of PE: Continue Xarelto.   7. Hyperlipidemia: LDL was 68 in 07/2021.  Monitored and managed per PCP.  Continue Lovaza, Lipitor.  8. Disposition: Follow-up in 3 to 4 months.     Lenna Sciara, NP 10/29/2021, 9:36 AM

## 2021-10-29 NOTE — Patient Instructions (Addendum)
Medication Instructions:  Your physician recommends that you continue on your current medications as directed. Please refer to the Current Medication list given to you today.   *If you need a refill on your cardiac medications before your next appointment, please call your pharmacy*   Lab Work: Your physician recommends that you complete lab work today CBC  If you have labs (blood work) drawn today and your tests are completely normal, you will receive your results only by: [place] (if you have MyChart) OR A paper copy in the mail If you have any lab test that is abnormal or we need to change your treatment, we will call you to review the results.   Testing/Procedures: CT Angio Abdomen/Pelvis     Follow-Up: At [facility], you and your health needs are our priority.  As part of our continuing mission to provide you with exceptional heart care, we have created designated Provider Care Teams.  These Care Teams include your primary Cardiologist (physician) and Advanced Practice Providers (APPs -  Physician Assistants and Nurse Practitioners) who all work together to provide you with the care you need, when you need it.  We recommend signing up for the patient portal called "MyChart".  Sign up information is provided on this After Visit Summary.  MyChart is used to connect with patients for Virtual Visits (Telemedicine).  Patients are able to view lab/test results, encounter notes, upcoming appointments, etc.  Non-urgent messages can be sent to your provider as well.   To learn more about what you can do with MyChart, go to [email or web address].    Your next appointment:   3 month(s)  The format for your next appointment:   In Person  Provider:   Kirk Ruths, MD        Other Instructions   Important Information About Sugar     .

## 2021-10-30 ENCOUNTER — Telehealth: Payer: Self-pay

## 2021-10-30 LAB — CBC
Hematocrit: 42.8 % (ref 34.0–46.6)
Hemoglobin: 14.6 g/dL (ref 11.1–15.9)
MCH: 31.6 pg (ref 26.6–33.0)
MCHC: 34.1 g/dL (ref 31.5–35.7)
MCV: 93 fL (ref 79–97)
Platelets: 170 10*3/uL (ref 150–450)
RBC: 4.62 x10E6/uL (ref 3.77–5.28)
RDW: 12.8 % (ref 11.7–15.4)
WBC: 5.7 10*3/uL (ref 3.4–10.8)

## 2021-10-30 NOTE — Telephone Encounter (Signed)
Spoke with pts. She was notified of lab results. Pt will continue current medications and follow up as planned.

## 2021-11-05 ENCOUNTER — Ambulatory Visit (HOSPITAL_COMMUNITY): Payer: Medicare HMO

## 2021-11-13 ENCOUNTER — Ambulatory Visit (HOSPITAL_COMMUNITY)
Admission: RE | Admit: 2021-11-13 | Discharge: 2021-11-13 | Disposition: A | Discharge status: Home / Self Care | Payer: Medicare HMO | Source: Ambulatory Visit | Attending: Nurse Practitioner | Admitting: Nurse Practitioner

## 2021-11-13 DIAGNOSIS — Z8679 Personal history of other diseases of the circulatory system: Secondary | ICD-10-CM | POA: Insufficient documentation

## 2021-11-13 DIAGNOSIS — K5730 Diverticulosis of large intestine without perforation or abscess without bleeding: Secondary | ICD-10-CM | POA: Diagnosis not present

## 2021-11-13 DIAGNOSIS — I480 Paroxysmal atrial fibrillation: Secondary | ICD-10-CM

## 2021-11-13 DIAGNOSIS — I7121 Aneurysm of the ascending aorta, without rupture: Secondary | ICD-10-CM | POA: Diagnosis not present

## 2021-11-13 DIAGNOSIS — I2510 Atherosclerotic heart disease of native coronary artery without angina pectoris: Secondary | ICD-10-CM | POA: Diagnosis not present

## 2021-11-13 DIAGNOSIS — I7779 Dissection of other artery: Secondary | ICD-10-CM | POA: Diagnosis not present

## 2021-11-13 DIAGNOSIS — I728 Aneurysm of other specified arteries: Secondary | ICD-10-CM | POA: Diagnosis not present

## 2021-11-13 DIAGNOSIS — K8020 Calculus of gallbladder without cholecystitis without obstruction: Secondary | ICD-10-CM | POA: Diagnosis not present

## 2021-11-13 DIAGNOSIS — I714 Abdominal aortic aneurysm, without rupture, unspecified: Secondary | ICD-10-CM | POA: Diagnosis not present

## 2021-11-13 DIAGNOSIS — J8410 Pulmonary fibrosis, unspecified: Secondary | ICD-10-CM | POA: Diagnosis not present

## 2021-11-13 MED ORDER — IOHEXOL 350 MG/ML SOLN
75.0000 mL | Freq: Once | INTRAVENOUS | Status: AC | PRN
  Administered 2021-11-13: 75 mL via INTRAVENOUS

## 2021-11-14 DIAGNOSIS — Z23 Encounter for immunization: Secondary | ICD-10-CM | POA: Diagnosis not present

## 2021-11-17 ENCOUNTER — Telehealth: Payer: Self-pay

## 2021-11-17 NOTE — Telephone Encounter (Signed)
Spoke with pt. Pt was notified of CT results and recommendations.

## 2021-12-07 ENCOUNTER — Other Ambulatory Visit (HOSPITAL_COMMUNITY): Payer: Self-pay | Admitting: *Deleted

## 2021-12-07 MED ORDER — RIVAROXABAN 15 MG PO TABS: ORAL_TABLET | ORAL | 2 refills | Status: DC

## 2022-02-08 DIAGNOSIS — H524 Presbyopia: Secondary | ICD-10-CM | POA: Diagnosis not present

## 2022-02-23 ENCOUNTER — Ambulatory Visit: Payer: Medicare HMO | Admitting: Cardiology

## 2022-03-22 NOTE — Progress Notes (Signed)
HPI: FU atrial fibrillation. Admitted December 2015 with a syncopal episode. She was found to be in AFib with RVR.  EF was noted to be 40-45% (possibly related to tachycardia).  It was questioned whether or not her syncope may be related to post-termination pauses.  She was placed on beta blocker.  Long term anticoagulation with Xarelto was recommended.  Her Chadsvasc score is 5. She subsequently underwent a 30 day event monitor which showed no arrhythmias. Lexiscan nuclear study 1/16 showed no ischemia and her ejection fraction had improved to 70%. CTA September 2018 showed ectatic ascending thoracic aorta measuring 3.7 cm. CTA April 2022 showed small dissection flap in the left proximal subclavian artery and she was asked to follow-up with vascular surgery. Patient seen in the emergency room April and May 2022 with recurrent atrial fibrillation.  She converted to sinus rhythm with Cardizem.  Echo 6/22 showed normal LV function.  Monitor September 2023 showed sinus rhythm with episodes of SVT longest 13 seconds.  Since last seen, she has dyspnea with more vigorous activities but not routine activities.  No orthopnea, PND, pedal edema, chest pain, syncope or bleeding.  She does not fall.  Current Outpatient Medications  Medication Sig Dispense Refill   acetaminophen (TYLENOL) 500 MG tablet Take 1,000 mg by mouth every 6 (six) hours as needed for moderate pain or fever (fever).      amoxicillin-clavulanate (AUGMENTIN) 875-125 MG tablet Take by mouth 2 (two) times daily.     atorvastatin (LIPITOR) 20 MG tablet Take 20 mg by mouth every evening.     B Complex Vitamins (B COMPLEX-B12 PO) Take 1 tablet by mouth daily.     Calcium Citrate-Vitamin D (CALCIUM + D PO) Take 600 mg by mouth daily.     Carboxymeth-Glyc-Polysorb PF (REFRESH OPTIVE MEGA-3) 0.5-1-0.5 % SOLN one drop in both eyes twice a day     cholecalciferol (VITAMIN D) 1000 units tablet Take 1 capsule by mouth daily.     Cinnamon 500 MG  capsule Take 500 mg by mouth daily.     diltiazem (CARDIZEM) 30 MG tablet TAKE 1 TABLET TWICE DAILY 180 tablet 3   levothyroxine (SYNTHROID) 88 MCG tablet Take 88 mcg by mouth daily before breakfast.     omega-3 acid ethyl esters (LOVAZA) 1 g capsule Take 1 g by mouth daily.     Rivaroxaban (XARELTO) 15 MG TABS tablet TAKE 1 TABLET (15 MG TOTAL) BY MOUTH DAILY WITH SUPPER 90 tablet 2   traZODone (DESYREL) 50 MG tablet Take 50 mg by mouth at bedtime.     vitamin C (ASCORBIC ACID) 500 MG tablet Take 500 mg by mouth daily.      No current facility-administered medications for this visit.     Past Medical History:  Diagnosis Date   Ascending aortic aneurysm (HCC)    3.8 x 4 cm on CTA 12/2013   CAD (coronary artery disease)    a. Chest CTA 01/16/14:  IMPRESSION: 1. Negative for pulmonary embolus or acute vascular abnormality. 2. Atherosclerosis and coronary artery disease. 3. Unchanged ascending aortic ectasia measuring 38 mm x 40 mm.  Recommend annual imaging followup by CTA or MRA   Cardiomyopathy [facility])    a.  Echocardiogram 12/3013:  Left ventricle: Systolic function was mildly to moderately reduced. The estimated ejection fraction was in the range of 40% to 45%. Regional wall motion abnormalities cannot be excluded.  The study is not technically sufficient to allow evaluation of LV diastolic  function.   DVT (deep venous thrombosis) (HCC)    History of anemia    Hx of cardiovascular stress test    Lexiscan Myoview (01/2014):  No ischemia or scar, EF 70%; Normal Study/Low Risk   Hypercholesteremia    Hypothyroidism    Left knee DJD    PAF (paroxysmal atrial fibrillation) (HCC)    PE (pulmonary embolism)    Pulmonary embolism (HCC)    Raynaud disease     Past Surgical History:  Procedure Laterality Date   ABDOMINAL HYSTERECTOMY  1973   FROZEN SHOULDER     KNEE ARTHROSCOPY Left    SHOULDER ADHESION RELEASE Left 2000   TONSILLECTOMY  1961   TOTAL KNEE ARTHROPLASTY Left 12/25/2012    Procedure: TOTAL KNEE ARTHROPLASTY- left;  Surgeon: Lorn Junes, MD;  Location: [facility];  Service: Orthopedics;  Laterality: Left;    Social History   Socioeconomic History   Marital status: Widowed    Spouse name: Not on file   Number of children: Not on file   Years of education: Not on file   Highest education level: Not on file  Occupational History   Not on file  Tobacco Use   Smoking status: Never   Smokeless tobacco: Never   Tobacco comments:    Never smoke 09/03/21  Vaping Use   Vaping Use: Never used  Substance and Sexual Activity   Alcohol use: No    Comment: Rare glass of wine   Drug use: No   Sexual activity: Not on file  Other Topics Concern   Not on file  Social History Narrative   Not on file   Social Determinants of Health   Financial Resource Strain: Not on file  Food Insecurity: Not on file  Transportation Needs: Not on file  Physical Activity: Not on file  Stress: Not on file  Social Connections: Not on file  Intimate Partner Violence: Not on file    Family History  Problem Relation Age of Onset   Cancer - Other Mother    Diabetes Father    Melanoma Sister    Heart disease Sister 15       CABG   Heart attack Sister    Stroke Neg Hx    Hypertension Neg Hx     ROS: no fevers or chills, productive cough, hemoptysis, dysphasia, odynophagia, melena, hematochezia, dysuria, hematuria, rash, seizure activity, orthopnea, PND, pedal edema, claudication. Remaining systems are negative.  Physical Exam: Well-developed well-nourished in no acute distress.  Skin is warm and dry.  HEENT is normal.  Neck is supple.  Chest is clear to auscultation with normal expansion.  Cardiovascular exam is regular rate and rhythm.  Abdominal exam nontender or distended. No masses palpated. Extremities show no edema. neuro grossly intact   A/P  1 paroxysmal atrial fibrillation-plan to continue Cardizem and Xarelto.  She was previously prescribed amiodarone  but did not take this medication as she was concerned about the potential side effects.  Hemoglobin and renal function monitored by primary care.  2 coronary artery disease-based on previous coronary calcification noted on CT.  Continue statin.  No aspirin given need for Xarelto.  3 history of thoracic aortic aneurysm/dissection and subclavian-evaluated by vascular surgery and medical therapy recommended.  4 hyperlipidemia-continue statin.  Lipids and liver monitored by primary care.  5 prior pulmonary embolus-she is on Xarelto for atrial fibrillation.  6 history of cardiomyopathy-this was previously felt secondary to tachycardia.  Her LV function has improved on most  recent echocardiogram.  Kirk Ruths, MD

## 2022-03-29 DIAGNOSIS — I4891 Unspecified atrial fibrillation: Secondary | ICD-10-CM | POA: Diagnosis not present

## 2022-03-29 DIAGNOSIS — I2699 Other pulmonary embolism without acute cor pulmonale: Secondary | ICD-10-CM | POA: Diagnosis not present

## 2022-03-29 DIAGNOSIS — B027 Disseminated zoster: Secondary | ICD-10-CM | POA: Diagnosis not present

## 2022-03-30 ENCOUNTER — Encounter: Payer: Self-pay | Admitting: Cardiology

## 2022-03-30 ENCOUNTER — Ambulatory Visit: Payer: Medicare HMO | Attending: Cardiology | Admitting: Cardiology

## 2022-03-30 VITALS — BP 136/76 | HR 78 | Ht 70.0 in | Wt 149.2 lb

## 2022-03-30 DIAGNOSIS — I7121 Aneurysm of the ascending aorta, without rupture: Secondary | ICD-10-CM | POA: Diagnosis not present

## 2022-03-30 DIAGNOSIS — Z8679 Personal history of other diseases of the circulatory system: Secondary | ICD-10-CM

## 2022-03-30 DIAGNOSIS — E785 Hyperlipidemia, unspecified: Secondary | ICD-10-CM | POA: Diagnosis not present

## 2022-03-30 DIAGNOSIS — I480 Paroxysmal atrial fibrillation: Secondary | ICD-10-CM | POA: Diagnosis not present

## 2022-03-30 DIAGNOSIS — I2510 Atherosclerotic heart disease of native coronary artery without angina pectoris: Secondary | ICD-10-CM | POA: Diagnosis not present

## 2022-03-30 NOTE — Patient Instructions (Signed)
    Follow-Up: At [facility], you and your health needs are our priority.  As part of our continuing mission to provide you with exceptional heart care, we have created designated Provider Care Teams.  These Care Teams include your primary Cardiologist (physician) and Advanced Practice Providers (APPs -  Physician Assistants and Nurse Practitioners) who all work together to provide you with the care you need, when you need it.  We recommend signing up for the patient portal called "MyChart".  Sign up information is provided on this After Visit Summary.  MyChart is used to connect with patients for Virtual Visits (Telemedicine).  Patients are able to view lab/test results, encounter notes, upcoming appointments, etc.  Non-urgent messages can be sent to your provider as well.   To learn more about what you can do with MyChart, go to [email or web address].    Your next appointment:   12 month(s)  Provider:   Brian Crenshaw, MD     

## 2022-04-02 ENCOUNTER — Other Ambulatory Visit (HOSPITAL_COMMUNITY): Payer: Self-pay | Admitting: Internal Medicine

## 2022-04-02 ENCOUNTER — Ambulatory Visit (HOSPITAL_COMMUNITY)
Admission: RE | Admit: 2022-04-02 | Discharge: 2022-04-02 | Disposition: A | Discharge status: Home / Self Care | Payer: Medicare HMO | Source: Ambulatory Visit | Attending: Internal Medicine | Admitting: Internal Medicine

## 2022-04-02 DIAGNOSIS — H9202 Otalgia, left ear: Secondary | ICD-10-CM | POA: Diagnosis not present

## 2022-04-02 DIAGNOSIS — G319 Degenerative disease of nervous system, unspecified: Secondary | ICD-10-CM | POA: Diagnosis not present

## 2022-04-02 DIAGNOSIS — R519 Headache, unspecified: Secondary | ICD-10-CM

## 2022-04-09 ENCOUNTER — Other Ambulatory Visit: Payer: Self-pay | Admitting: Cardiology

## 2022-04-19 ENCOUNTER — Other Ambulatory Visit: Payer: Self-pay

## 2022-04-19 ENCOUNTER — Emergency Department (HOSPITAL_BASED_OUTPATIENT_CLINIC_OR_DEPARTMENT_OTHER)
Admission: EM | Admit: 2022-04-19 | Discharge: 2022-04-19 | Disposition: A | Discharge status: Home / Self Care | Payer: Medicare HMO | Attending: Emergency Medicine | Admitting: Emergency Medicine

## 2022-04-19 ENCOUNTER — Emergency Department (HOSPITAL_BASED_OUTPATIENT_CLINIC_OR_DEPARTMENT_OTHER): Payer: Medicare HMO | Admitting: Radiology

## 2022-04-19 ENCOUNTER — Emergency Department (HOSPITAL_BASED_OUTPATIENT_CLINIC_OR_DEPARTMENT_OTHER): Payer: Medicare HMO

## 2022-04-19 ENCOUNTER — Encounter (HOSPITAL_BASED_OUTPATIENT_CLINIC_OR_DEPARTMENT_OTHER): Payer: Self-pay | Admitting: Emergency Medicine

## 2022-04-19 DIAGNOSIS — R0602 Shortness of breath: Secondary | ICD-10-CM

## 2022-04-19 DIAGNOSIS — R195 Other fecal abnormalities: Secondary | ICD-10-CM | POA: Diagnosis not present

## 2022-04-19 DIAGNOSIS — Z7901 Long term (current) use of anticoagulants: Secondary | ICD-10-CM | POA: Diagnosis not present

## 2022-04-19 DIAGNOSIS — I4891 Unspecified atrial fibrillation: Secondary | ICD-10-CM | POA: Insufficient documentation

## 2022-04-19 DIAGNOSIS — R42 Dizziness and giddiness: Secondary | ICD-10-CM | POA: Diagnosis not present

## 2022-04-19 DIAGNOSIS — K921 Melena: Secondary | ICD-10-CM | POA: Diagnosis not present

## 2022-04-19 LAB — CBC WITH DIFFERENTIAL/PLATELET
Abs Immature Granulocytes: 0.02 10*3/uL (ref 0.00–0.07)
Basophils Absolute: 0.1 10*3/uL (ref 0.0–0.1)
Basophils Relative: 1 %
Eosinophils Absolute: 0.1 10*3/uL (ref 0.0–0.5)
Eosinophils Relative: 2 %
HCT: 44.8 % (ref 36.0–46.0)
Hemoglobin: 15 g/dL (ref 12.0–15.0)
Immature Granulocytes: 0 %
Lymphocytes Relative: 28 %
Lymphs Abs: 1.8 10*3/uL (ref 0.7–4.0)
MCH: 31.8 pg (ref 26.0–34.0)
MCHC: 33.5 g/dL (ref 30.0–36.0)
MCV: 94.9 fL (ref 80.0–100.0)
Monocytes Absolute: 0.6 10*3/uL (ref 0.1–1.0)
Monocytes Relative: 9 %
Neutro Abs: 3.9 10*3/uL (ref 1.7–7.7)
Neutrophils Relative %: 60 %
Platelets: 177 10*3/uL (ref 150–400)
RBC: 4.72 MIL/uL (ref 3.87–5.11)
RDW: 13.8 % (ref 11.5–15.5)
WBC: 6.5 10*3/uL (ref 4.0–10.5)
nRBC: 0 % (ref 0.0–0.2)

## 2022-04-19 LAB — COMPREHENSIVE METABOLIC PANEL
ALT: 15 U/L (ref 0–44)
AST: 20 U/L (ref 15–41)
Albumin: 4.2 g/dL (ref 3.5–5.0)
Alkaline Phosphatase: 77 U/L (ref 38–126)
Anion gap: 9 (ref 5–15)
BUN: 15 mg/dL (ref 8–23)
CO2: 30 mmol/L (ref 22–32)
Calcium: 10 mg/dL (ref 8.9–10.3)
Chloride: 100 mmol/L (ref 98–111)
Creatinine, Ser: 0.9 mg/dL (ref 0.44–1.00)
GFR, Estimated: >60 mL/min (ref >60)
Glucose, Bld: 105 mg/dL — ABNORMAL HIGH (ref 70–99)
Potassium: 4.5 mmol/L (ref 3.5–5.1)
Sodium: 139 mmol/L (ref 135–145)
Total Bilirubin: 0.8 mg/dL (ref 0.3–1.2)
Total Protein: 7.6 g/dL (ref 6.5–8.1)

## 2022-04-19 LAB — OCCULT BLOOD X 1 CARD TO LAB, STOOL: Fecal Occult Bld: POSITIVE — AB

## 2022-04-19 NOTE — ED Notes (Signed)
Hemoccult card at bedside 

## 2022-04-19 NOTE — ED Notes (Signed)
Patient verbalizes understanding of discharge instructions. Opportunity for questioning and answers were provided. Patient discharged from ED.  °

## 2022-04-19 NOTE — ED Notes (Signed)
Consult cancelled per [facility]

## 2022-04-19 NOTE — Discharge Instructions (Addendum)
Thank you for allowing me be part of your care today.  I spoke with the on-call cardiologist with [facility], who recommends calling Dr. Jacalyn Lefevre office to schedule a follow-up appointment this week.    Due to you having dark stool and it testing positive for blood, I am recommending that you schedule an appointment as soon as possible with [facility] gastroenterology.  You may require further workup to look for a source of bleeding, especially since you take a blood thinner.    Return to the ED if you have worsening of your symptoms or if you have any new concerns.

## 2022-04-19 NOTE — ED Triage Notes (Signed)
Pt arrives pov, slow gait with c/o shob and dizziness, dark blood in stool x 1 month

## 2022-04-19 NOTE — ED Provider Notes (Signed)
[facility] Provider Note   CSN: [id number] Arrival date & time: 04/19/22  1029     History  Chief Complaint  Patient presents with   Shortness of Breath    Whitney Medina is a 89 y.o. female with past medical history as outlined below presents to the ED complaining of shortness of breath and dizziness.  Patient has also had dark blood in her stool for the past month.  Patient reports that she becomes dyspneic with just walking a few steps across the room.  Patient states that she has been having shortness of breath with moderate activity, but has since worsened.  Patient is followed by cardiology and is anticoagulated for atrial fibrillation.  Patient also takes Cardizem, and has not missed any of her doses.  No recent changes in any medications.  Denies syncope, fall, chest pain, chest tightness, fever, cough, nausea, vomiting, diarrhea, constipation, abdominal pain.         Home Medications Prior to Admission medications   Medication Sig Start Date End Date Taking? Authorizing Provider  acetaminophen (TYLENOL) 500 MG tablet Take 1,000 mg by mouth every 6 (six) hours as needed for moderate pain or fever (fever).     [provider]  amoxicillin-clavulanate (AUGMENTIN) 875-125 MG tablet Take by mouth 2 (two) times daily. 12/21/21   [provider]  atorvastatin (LIPITOR) 20 MG tablet Take 20 mg by mouth every evening.    [provider]  B Complex Vitamins (B COMPLEX-B12 PO) Take 1 tablet by mouth daily.    [provider]  Calcium Citrate-Vitamin D (CALCIUM + D PO) Take 600 mg by mouth daily.    [provider]  Carboxymeth-Glyc-Polysorb PF (REFRESH OPTIVE MEGA-3) 0.5-1-0.5 % SOLN one drop in both eyes twice a day 05/23/19   [provider]  cholecalciferol (VITAMIN D) 1000 units tablet Take 1 capsule by mouth daily.    [provider]  Cinnamon 500 MG capsule Take 500 mg by  mouth daily.    [provider]  diltiazem (CARDIZEM) 30 MG tablet TAKE 1 TABLET TWICE DAILY 04/09/22   Lelon Perla, MD  levothyroxine (SYNTHROID) 88 MCG tablet Take 88 mcg by mouth daily before breakfast.    [provider]  omega-3 acid ethyl esters (LOVAZA) 1 g capsule Take 1 g by mouth daily.    [provider]  Rivaroxaban (XARELTO) 15 MG TABS tablet TAKE 1 TABLET (15 MG TOTAL) BY MOUTH DAILY WITH SUPPER 12/07/21   Fenton, Clint R, PA  traZODone (DESYREL) 50 MG tablet Take 50 mg by mouth at bedtime.    [provider]  vitamin C (ASCORBIC ACID) 500 MG tablet Take 500 mg by mouth daily.     [provider]      Allergies    Codeine and Crestor [rosuvastatin]    Review of Systems   Review of Systems  Constitutional:  Negative for fever.  Respiratory:  Positive for shortness of breath. Negative for cough and chest tightness.   Cardiovascular:  Negative for chest pain.  Gastrointestinal:  Positive for blood in stool. Negative for abdominal pain, constipation, diarrhea, nausea and vomiting.    Physical Exam Updated Vital Signs BP 136/70   Pulse 76   Temp 98.2 F (36.8 C)   Resp 18   Ht 5' 9.5" (1.765 m)   Wt 64.9 kg   SpO2 97%   BMI 20.81 kg/m  Physical Exam Vitals and nursing note  reviewed.  Constitutional:      General: She is not in acute distress.    Appearance: She is not ill-appearing or diaphoretic.  HENT:     Mouth/Throat:     Mouth: Mucous membranes are moist.     Pharynx: Oropharynx is clear.  Eyes:     Extraocular Movements: Extraocular movements intact.     Conjunctiva/sclera: Conjunctivae normal.     Pupils: Pupils are equal, round, and reactive to light.  Cardiovascular:     Rate and Rhythm: Normal rate and regular rhythm.     Pulses: Normal pulses.     Heart sounds: Normal heart sounds.  Pulmonary:     Effort: Pulmonary effort is normal. No respiratory distress.     Breath sounds: Normal breath sounds  and air entry.     Comments: Patient is resting comfortably in bed and does not appear tachypneic or to be in respiratory distress. Abdominal:     General: Abdomen is flat. Bowel sounds are normal. There is no distension.     Palpations: Abdomen is soft.     Tenderness: There is no abdominal tenderness.  Genitourinary:    Rectum: Guaiac result positive. External hemorrhoid present. No tenderness, anal fissure or internal hemorrhoid. Normal anal tone.  Skin:    General: Skin is warm and dry.     Capillary Refill: Capillary refill takes less than 2 seconds.  Neurological:     Mental Status: She is alert and oriented to person, place, and time. Mental status is at baseline.     Sensory: Sensation is intact.     Motor: Motor function is intact.     Coordination: Coordination is intact.     Gait: Gait is intact.  Psychiatric:        Mood and Affect: Mood normal.        Behavior: Behavior normal.     ED Results / Procedures / Treatments   Labs (all labs ordered are listed, but only abnormal results are displayed) Labs Reviewed  COMPREHENSIVE METABOLIC PANEL - Abnormal; Notable for the following components:      Result Value   Glucose, Bld 105 (*)    All other components within normal limits  OCCULT BLOOD X 1 CARD TO LAB, STOOL - Abnormal; Notable for the following components:   Fecal Occult Bld POSITIVE (*)    All other components within normal limits  CBC WITH DIFFERENTIAL/PLATELET    EKG EKG Interpretation  Date/Time:  Monday April 19 2022 10:36:29 EDT Ventricular Rate:  95 PR Interval:  148 QRS Duration: 64 QT Interval:  325 QTC Calculation: 341 R Axis:   34 Text Interpretation: Sinus rhythm Low voltage, precordial leads Borderline repolarization abnormality Baseline wander in lead(s) V1 No significant change since last tracing Confirmed by Fredia Sorrow [id number]) on 04/19/2022 11:08:46 AM  Radiology DG Chest 1 View  Result Date: 04/19/2022 CLINICAL DATA:  Shortness of  breath. EXAM: CHEST  1 VIEW COMPARISON:  Chest radiograph 06/13/2021 and CTA 11/13/2021 FINDINGS: The cardiomediastinal silhouette is unchanged with normal heart size. Aortic atherosclerosis and a right lower lobe calcified granuloma are again noted. There is mild chronic accentuation of the interstitial markings. No acute airspace consolidation, overt pulmonary edema, sizable pleural effusion, or pneumothorax is identified. No acute osseous abnormality is seen. IMPRESSION: No active disease. Electronically Signed   By: Logan Bores M.D.   On: 04/19/2022 11:02    Procedures Procedures    Medications Ordered in ED Medications - No data to  display  ED Course/ Medical Decision Making/ A&P                             Medical Decision Making Amount and/or Complexity of Data Reviewed Labs: ordered. Radiology: ordered.   This patient presents to the ED with chief complaint(s) of dyspnea with exertion and dizziness with pertinent past medical history of paroxysmal A-fib on chronic anticoagulation, anemia, CAD.  The complaint involves an extensive differential diagnosis and also carries with it a high risk of complications and morbidity.    The differential diagnosis includes anemia, symptomatic A-fib, GI bleed, electrolyte derangement, acute infection, new onset heart failure  The initial plan is to obtain baseline labs and Hemoccult  Additional history obtained: Additional history obtained from family, patient's daughter is at bedside and helps provide part of the HPI and which doctors patient sees for chronic conditions Records reviewed  cardiology note from March.  Patient was complaining of dyspnea with exertion at this visit, but denied it with light activity.  Patient was not in A-fib at that visit.  Initial Assessment:   On exam, patient is resting comfortably in bed and is not ill-appearing or in acute respiratory distress.  Patient not dyspneic or tachypneic on exam.  Lungs are clear  to auscultation bilaterally with adequate tidal volume.  Heart rate is normal in the 70s with an irregular rhythm.  Rhythm on the monitor appears to be A-fib.  Abdomen is soft and nontender to palpation.  Rectal exam with nonthrombosed external hemorrhoids, no obvious palpable internal hemorrhoids.  No bright red or dark red blood.  Stool obtained for Hemoccult was light brown in color.  Independent ECG/labs interpretation:  The following labs were independently interpreted:  CBC without leukocytosis or anemia.  Hemoglobin is 15.  Metabolic panel without electrolyte derangement.  LFTs are within normal range as is bilirubin.  Renal function is preserved.  Fecal occult positive.  Independent visualization and interpretation of imaging: I independently visualized the following imaging with scope of interpretation limited to determining acute life threatening conditions related to emergency care: Chest x-ray, which revealed no evidence of acute cardiopulmonary disease.  I agree with radiologist interpretation.  Disposition:   I requested consultation with on-call cardiologist and spoke with Dr. Audie Box who recommended patient follow-up outpatient with Dr. Stanford Breed.  Patient did not wish to wait for GI consult for melena, given that patient is stable and appropriate for discharge home, will recommend that she call gastroenterology tomorrow to schedule a follow-up appointment.  Patient is hemodynamically stable and has good hemoglobin despite Hemoccult positive.  Patient states that she will call gastroenterology tomorrow to schedule follow-up.  The patient has been appropriately medically screened and/or stabilized in the ED. I have low suspicion for any other emergent medical condition which would require further screening, evaluation or treatment in the ED or require inpatient management. At time of discharge the patient is hemodynamically stable and in no acute distress. I have discussed work-up results  and diagnosis with patient and answered all questions. Patient is agreeable with discharge plan. We discussed strict return precautions for returning to the emergency department and they verbalized understanding.            Final Clinical Impression(s) / ED Diagnoses Final diagnoses:  Shortness of breath  Dizziness  Occult blood in stools    Rx / DC Orders ED Discharge Orders     None  Melton Alar R, PA-C 04/19/22 1836    Vanetta Mulders, MD 04/23/22 1622

## 2022-04-19 NOTE — ED Notes (Signed)
X-ray at bedside

## 2022-04-20 ENCOUNTER — Encounter (HOSPITAL_BASED_OUTPATIENT_CLINIC_OR_DEPARTMENT_OTHER): Payer: Self-pay | Admitting: Family

## 2022-04-20 ENCOUNTER — Encounter: Payer: Self-pay | Admitting: Nurse Practitioner

## 2022-04-20 ENCOUNTER — Telehealth: Payer: Self-pay | Admitting: Cardiology

## 2022-04-20 ENCOUNTER — Ambulatory Visit (HOSPITAL_BASED_OUTPATIENT_CLINIC_OR_DEPARTMENT_OTHER): Payer: Medicare HMO | Admitting: Family

## 2022-04-20 VITALS — BP 142/84 | HR 104 | Ht 69.5 in | Wt 143.0 lb

## 2022-04-20 DIAGNOSIS — I480 Paroxysmal atrial fibrillation: Secondary | ICD-10-CM | POA: Diagnosis not present

## 2022-04-20 DIAGNOSIS — R5383 Other fatigue: Secondary | ICD-10-CM

## 2022-04-20 DIAGNOSIS — R0609 Other forms of dyspnea: Secondary | ICD-10-CM

## 2022-04-20 DIAGNOSIS — E7800 Pure hypercholesterolemia, unspecified: Secondary | ICD-10-CM

## 2022-04-20 DIAGNOSIS — Z79899 Other long term (current) drug therapy: Secondary | ICD-10-CM | POA: Diagnosis not present

## 2022-04-20 MED ORDER — AMIODARONE HCL 200 MG PO TABS: ORAL_TABLET | ORAL | 3 refills | Status: DC

## 2022-04-20 NOTE — Progress Notes (Unsigned)
Office Visit    Patient Name: Whitney Medina Date of Encounter: 04/20/2022  PCP:  Prince Solian, MD   [place]  Cardiologist:  Kirk Ruths, MD  Advanced Practice Provider:  No care team member to display Electrophysiologist:  None      Chief Complaint    Whitney Medina is a 89 y.o. female presents today for ED visit follow up.    Past Medical History    Past Medical History:  Diagnosis Date   Ascending aortic aneurysm    3.8 x 4 cm on CTA 12/2013   CAD (coronary artery disease)    a. Chest CTA 01/16/14:  IMPRESSION: 1. Negative for pulmonary embolus or acute vascular abnormality. 2. Atherosclerosis and coronary artery disease. 3. Unchanged ascending aortic ectasia measuring 38 mm x 40 mm.  Recommend annual imaging followup by CTA or MRA   Cardiomyopathy    a.  Echocardiogram 12/3013:  Left ventricle: Systolic function was mildly to moderately reduced. The estimated ejection fraction was in the range of 40% to 45%. Regional wall motion abnormalities cannot be excluded.  The study is not technically sufficient to allow evaluation of LV diastolic function.   DVT (deep venous thrombosis)    History of anemia    Hx of cardiovascular stress test    Lexiscan Myoview (01/2014):  No ischemia or scar, EF 70%; Normal Study/Low Risk   Hypercholesteremia    Hypothyroidism    Left knee DJD    PAF (paroxysmal atrial fibrillation)    PE (pulmonary embolism)    Pulmonary embolism    Raynaud disease    Past Surgical History:  Procedure Laterality Date   ABDOMINAL HYSTERECTOMY  01/19/1971   FROZEN SHOULDER     JOINT REPLACEMENT     KNEE ARTHROSCOPY Left    SHOULDER ADHESION RELEASE Left 01/18/1998   TONSILLECTOMY  01/19/1959   TOTAL KNEE ARTHROPLASTY Left 12/25/2012   Procedure: TOTAL KNEE ARTHROPLASTY- left;  Surgeon: Lorn Junes, MD;  Location: [facility];  Service: Orthopedics;  Laterality: Left;    Allergies  Allergies  Allergen  Reactions   Codeine Nausea And Vomiting    Percocet OK   Crestor [Rosuvastatin] Nausea Only    History of Present Illness    Whitney Medina is a 89 y.o. female with a hx of syncope, atrial fibrillation, coronary artery calcification on CT, hyperlipidemia last seen 03/30/22 by Dr. Stanford Breed.  Admitted with new onset atrial fibrillation and syncopal episode December 2015 with a EF 40 to 45%.  Additional prior cardiac workup includes Lexiscan Myoview 01/2014 with no ischemia and EF 72%.  CTA September 2018 with ectatic ascending thoracic aorta measuring up to 37 mm.  CTA April 2022 with small dissection flap in the left proximal subclavian artery recommend for follow-up with vascular surgery.  Echo 06/2020 normal LVEF.  Monitor 09/2021 sinus rhythm with episodes of SVT longest 13 seconds.  Last saw Dr. Stanford Breed 03/30/2022.  She was doing overall well with the suggestion of mild dyspnea with more than usual activity.  It was noted that she had previously been prescribed amiodarone but did not initiate as she was concerned about potential side effects.  Seen in the ED yesterday after presenting with shortness of breath and dizziness.  She also reported dark blood stool for 1 month.  Hemoglobin is 15.  Appointment with GI not until 06/02/22. Metabolic panel with no acute abnormalities.  Fecal occult was positive. EKG NSR but was told she  was in atrial fibrillation via telemetry monitoring.   Reports lightheadedness feels like she is off balance. This has been ongoing for about a month. No near syncope, syncope. Tells me sometimes when she is sitting she feels like it is too much effort to take a deep breath. Reports no palpitations. She is unaware of atrial fibrillation.  Enjoys walking her dog for exercise.   Does note she had shingles 3 weeks ago wonders if this might have triggered atrial fibrillation. She did have a head MRI which was unremarkable. She was on Prednisone which made her feel "out of  sorts". She completed the Prednisone a week ago.   EKGs/Labs/Other Studies Reviewed:   The following studies were reviewed today: Cardiac Studies & Procedures       ECHOCARDIOGRAM  ECHOCARDIOGRAM COMPLETE 07/01/2020  Narrative ECHOCARDIOGRAM REPORT    Patient Name:   Whitney Medina Date of Exam: 07/01/2020 Medical Rec #:  [id number]             Height:       70.0 in Accession #:    [id number]            Weight:       144.9 lb Date of Birth:  13-Jul-1932             BSA:          1.820 m Patient Age:    78 years              BP:           133/80 mmHg Patient Gender: F                     HR:           65 bpm. Exam Location:  Outpatient  Procedure: 2D Echo, Cardiac Doppler and Color Doppler  Indications:    I48.91* Unspeicified atrial fibrillation  History:        Patient has prior history of Echocardiogram examinations, most recent 01/16/2014.  Sonographer:    Merrie Roof RDCS Referring Phys: [id number] [place]   1. Left ventricular ejection fraction, by estimation, is 60 to 65%. The left ventricle has normal function. The left ventricle has no regional wall motion abnormalities. Left ventricular diastolic parameters are indeterminate. 2. Right ventricular systolic function is normal. The right ventricular size is normal. 3. The mitral valve is normal in structure. Trivial mitral valve regurgitation. 4. The aortic valve is tricuspid. There is moderate calcification of the aortic valve. Aortic valve regurgitation is not visualized.  FINDINGS Left Ventricle: Left ventricular ejection fraction, by estimation, is 60 to 65%. The left ventricle has normal function. The left ventricle has no regional wall motion abnormalities. The left ventricular internal cavity size was normal in size. There is no left ventricular hypertrophy. Left ventricular diastolic parameters are indeterminate.  Right Ventricle: The right ventricular size is normal. Right vetricular  wall thickness was not well visualized. Right ventricular systolic function is normal.  Left Atrium: Left atrial size was normal in size.  Right Atrium: Right atrial size was normal in size.  Pericardium: There is no evidence of pericardial effusion.  Mitral Valve: The mitral valve is normal in structure. Trivial mitral valve regurgitation.  Tricuspid Valve: The tricuspid valve is normal in structure. Tricuspid valve regurgitation is mild.  Aortic Valve: The aortic valve is tricuspid. There is moderate calcification of the aortic valve. Aortic valve regurgitation is not visualized. Aortic valve  mean gradient measures 2.0 mmHg. Aortic valve peak gradient measures 3.2 mmHg. Aortic valve area, by VTI measures 2.54 cm.  Pulmonic Valve: The pulmonic valve was normal in structure. Pulmonic valve regurgitation is not visualized.  Aorta: The aortic root and ascending aorta are structurally normal, with no evidence of dilitation.  IAS/Shunts: The atrial septum is grossly normal.   LEFT VENTRICLE PLAX 2D LVIDd:         3.90 cm  Diastology LVIDs:         2.50 cm  LV e' medial:    6.45 cm/s LV PW:         1.00 cm  LV E/e' medial:  10.3 LV IVS:        0.80 cm  LV e' lateral:   6.60 cm/s LVOT diam:     2.00 cm  LV E/e' lateral: 10.1 LV SV:         50 LV SV Index:   28 LVOT Area:     3.14 cm   RIGHT VENTRICLE RV Basal diam:  3.30 cm RV S prime:     11.90 cm/s TAPSE (M-mode): 2.0 cm  LEFT ATRIUM             Index       RIGHT ATRIUM           Index LA diam:        4.30 cm 2.36 cm/m  RA Area:     16.60 cm LA Vol (A2C):   53.2 ml 29.23 ml/m RA Volume:   40.50 ml  22.25 ml/m LA Vol (A4C):   60.7 ml 33.35 ml/m LA Biplane Vol: 60.7 ml 33.35 ml/m AORTIC VALVE AV Area (Vmax):    2.42 cm AV Area (Vmean):   2.43 cm AV Area (VTI):     2.54 cm AV Vmax:           90.10 cm/s AV Vmean:          60.800 cm/s AV VTI:            0.198 m AV Peak Grad:      3.2 mmHg AV Mean Grad:      2.0  mmHg LVOT Vmax:         69.40 cm/s LVOT Vmean:        47.100 cm/s LVOT VTI:          0.160 m LVOT/AV VTI ratio: 0.81  AORTA Ao Root diam: 3.10 cm Ao Asc diam:  3.10 cm  MITRAL VALVE               TRICUSPID VALVE MV Area (PHT): 2.99 cm    TR Peak grad:   25.2 mmHg MV Decel Time: 254 msec    TR Vmax:        251.00 cm/s MV E velocity: 66.40 cm/s MV A velocity: 80.10 cm/s  SHUNTS MV E/A ratio:  0.83        Systemic VTI:  0.16 m Systemic Diam: 2.00 cm  Mertie Moores MD Electronically signed by Mertie Moores MD Signature Date/Time: 07/01/2020/5:11:09 PM    Final    MONITORS  LONG TERM MONITOR-LIVE TELEMETRY (3-14 DAYS) 09/25/2021  Narrative Patch Wear Time:  13 days and 16 hours  Predominant rhythm is sinus rhythm Multiple SVT runs, longest 13 seconds Less than 1% ventricular and supraventricular ectopy No triggered episodes recorded  Will Camnitz, MD           EKG:  EKG is not ordered  today.  Recent Labs: 09/03/2021: B Natriuretic Peptide 253.2; TSH 0.682 04/19/2022: ALT 15; BUN 15; Creatinine, Ser 0.90; Hemoglobin 15.0; Platelets 177; Potassium 4.5; Sodium 139  Recent Lipid Panel    Component Value Date/Time   CHOL 141 03/12/2021 0943   TRIG 69 03/12/2021 0943   HDL 70 03/12/2021 0943   CHOLHDL 2.0 03/12/2021 0943   LDLCALC 57 03/12/2021 0943    Risk Assessment/Calculations:   CHA2DS2-VASc Score = 5   This indicates a 7.2% annual risk of stroke. The patient's score is based upon: CHF History: 1 HTN History: 0 Diabetes History: 0 Stroke History: 0 Vascular Disease History: 1 Age Score: 2 Gender Score: 1     Home Medications   Current Meds  Medication Sig   acetaminophen (TYLENOL) 500 MG tablet Take 1,000 mg by mouth every 6 (six) hours as needed for moderate pain or fever (fever).    atorvastatin (LIPITOR) 20 MG tablet Take 20 mg by mouth every evening.   B Complex Vitamins (B COMPLEX-B12 PO) Take 1 tablet by mouth daily.   Calcium Citrate-Vitamin D  (CALCIUM + D PO) Take 600 mg by mouth daily.   Carboxymeth-Glyc-Polysorb PF (REFRESH OPTIVE MEGA-3) 0.5-1-0.5 % SOLN one drop in both eyes twice a day   cholecalciferol (VITAMIN D) 1000 units tablet Take 1 capsule by mouth daily.   Cinnamon 500 MG capsule Take 500 mg by mouth daily.   diltiazem (CARDIZEM) 30 MG tablet TAKE 1 TABLET TWICE DAILY   levothyroxine (SYNTHROID) 88 MCG tablet Take 88 mcg by mouth daily before breakfast.   omega-3 acid ethyl esters (LOVAZA) 1 g capsule Take 1 g by mouth daily.   Rivaroxaban (XARELTO) 15 MG TABS tablet TAKE 1 TABLET (15 MG TOTAL) BY MOUTH DAILY WITH SUPPER   traZODone (DESYREL) 50 MG tablet Take 50 mg by mouth at bedtime.   vitamin C (ASCORBIC ACID) 500 MG tablet Take 500 mg by mouth daily.      Review of Systems      All other systems reviewed and are otherwise negative except as noted above.  Physical Exam    VS:  BP (!) 142/84 (BP Location: Left Arm, Patient Position: Sitting, Cuff Size: Normal)   Pulse (!) 104   Ht 5' 9.5" (1.765 m)   Wt 143 lb (64.9 kg)   SpO2 95%   BMI 20.81 kg/m  , BMI Body mass index is 20.81 kg/m.  Wt Readings from Last 3 Encounters:  04/20/22 143 lb (64.9 kg)  04/19/22 143 lb (64.9 kg)  03/30/22 149 lb 3.2 oz (67.7 kg)     GEN: Well nourished, well developed, in no acute distress. HEENT: normal. Neck: Supple, no JVD, carotid bruits, or masses. Cardiac: IRIR, no murmurs, rubs, or gallops. No clubbing, cyanosis, edema.  Radials/PT 2+ and equal bilaterally.  Respiratory:  Respirations regular and unlabored, clear to auscultation bilaterally. GI: Soft, nontender, nondistended. MS: No deformity or atrophy. Skin: Warm and dry, no rash. Neuro:  Strength and sensation are intact. Psych: Normal affect.  Assessment & Plan    PAF / Hypercoagulable state - IRIR on auscultation with elevated rate. Suspect recurrent atrial fib - was also noted in ED visit. As previously recommended by Atrial Fib clinic, Rx Amiodarone  400mg  BID x 7 days ? 200mg  BID x 7 days ? 200mg  QD. TSH, LFT in 6 weeks. Update echocardiogram due to recurrent atrial fibrillation and lightheadedness.  Continue Xarelto 15 mg daily.  Positive Hemoccult-positive Hemoccult in ED yesterday 04/19/2022  however hemoglobin 15.  Has not had dark stool today and previous dark stools or only partially dark.  She has upcoming appointment to establish with GI.  Will update CBC in 2 weeks to ensure no progressive anemia.  If it does show worsening hemoglobin may need to consider holding her Xarelto  CAD - based on coronary calcification on CT. Stable with no anginal symptoms. No indication for ischemic evaluation.  GDMT atorvastatin 20mg  QD. No ASA due to [facility]. Heart healthy diet and regular cardiovascular exercise encouraged.    History of thoracic aortic aneurysm/dissection subclavian-follows with [facility] and recommended for medical therapy.  Continue atorvastatin.  No aspirin due to [facility].  HLD- Continue Atorvastatin 20mg  QD. Continue to follow with PCP.   Prior PE-continue Xarelto due to need for [place] with PAF.  History of cardiomyopathy -previously felt to be secondary to tachycardia.  LVEF has recovered.Euvolemic and well compensated on exam. Update echo, as above.         Disposition: Follow up  after echocardiogram  with Kirk Ruths, MD or APP.  Signed, Loel Dubonnet, NP 04/20/2022, 2:28 PM [place]

## 2022-04-20 NOTE — Patient Instructions (Signed)
Medication Instructions:  Your physician has recommended you make the following change in your medication:   Start: Amiodarone 400mg  twice daily x 7 days  200mg  twice daily x 7 days  200mg  daily  *If you need a refill on your cardiac medications before your next appointment, please call your pharmacy*   Lab Work: Your physician recommends that you return for lab work in 2-3 weeks for CBC- can be same day as echo   Please return for Lab work in 6 weeks for Liver Function Test and TSH . You may come to the...   [facility] (3rd floor) [address], [place], [place] [address]  Open: 8am-Noon and 1pm-4:30pm  Please ring the doorbell on the small table when you exit the elevator and the Lab Tech will come get you  [place] [facility] [address] [place], [place], [place] [address] Open: 8am-1pm, then 2pm-4:30pm   [place]- Please see attached locations sheet stapled to your lab work with address and hours.    If you have labs (blood work) drawn today and your tests are completely normal, you will receive your results only by: [place] (if you have MyChart) OR A paper copy in the mail If you have any lab test that is abnormal or we need to change your treatment, we will call you to review the results.   Testing/Procedures: Your physician has requested that you have an echocardiogram. Echocardiography is a painless test that uses sound waves to create images of your heart. It provides your doctor with information about the size and shape of your heart and how well your heart's chambers and valves are working. This procedure takes approximately one hour. There are no restrictions for this procedure. Please do NOT wear cologne, perfume, aftershave, or lotions (deodorant is allowed). Please arrive 15 minutes prior to your appointment time.    Follow-Up: At [facility], you and your health needs are our priority.  As  part of our continuing mission to provide you with exceptional heart care, we have created designated Provider Care Teams.  These Care Teams include your primary Cardiologist (physician) and Advanced Practice Providers (APPs -  Physician Assistants and Nurse Practitioners) who all work together to provide you with the care you need, when you need it.  We recommend signing up for the patient portal called "MyChart".  Sign up information is provided on this After Visit Summary.  MyChart is used to connect with patients for Virtual Visits (Telemedicine).  Patients are able to view lab/test results, encounter notes, upcoming appointments, etc.  Non-urgent messages can be sent to your provider as well.   To learn more about what you can do with MyChart, go to [email or web address].    Your next appointment:   Follow up after Echo with either Dr. Stanford Breed, Laurann Montana, NP or [facility]

## 2022-04-20 NOTE — Telephone Encounter (Signed)
Patient c/o Palpitations:  High priority if patient c/o lightheadedness, shortness of breath, or chest pain  How long have you had palpitations/irregular HR/ Afib? Are you having the symptoms now? "I didn't know I was in Afib until I got to the hospital yesterday, I went because I was feeling dizziness, SOB and had blood in my stool"  Are you currently experiencing lightheadedness, SOB or CP? "Lightheadedness, very fatigue and can barely walk across the floor, I've never had this happened before. Ive been in Afib a few times but never anything like this"  Do you have a history of afib (atrial fibrillation) or irregular heart rhythm? Afib  Have you checked your BP or HR? (document readings if available): No  Are you experiencing any other symptoms? No

## 2022-04-20 NOTE — Telephone Encounter (Signed)
Received call from patient.Stated she has been having dizziness,sob.She went to [facility] ED last night.She was told she was in Afib,rate 79 to 45.She was told to see Dr.Crenshaw.Advised Dr.Crenshaw is out of office this week.Afib clinic schedule is full all week.Appointment scheduled with Laurann Montana NP today at 2:45 pm.Directions given.

## 2022-04-21 ENCOUNTER — Telehealth: Payer: Self-pay | Admitting: Cardiology

## 2022-04-21 NOTE — Telephone Encounter (Signed)
Pt c/o medication issue:  1. Name of Medication:  amiodarone (PACERONE) 200 MG tablet   2. How are you currently taking this medication (dosage and times per day)?   3. Are you having a reaction (difficulty breathing--STAT)?   4. What is your medication issue?   Patient's daughter states the patient accidentally took all of her medication for today (800 MG total) and she would like to know what she needs to do. Please advise.

## 2022-04-21 NOTE — Telephone Encounter (Signed)
Called patient, spoke with daughter-   Patient is suppose to take Amiodarone 400 mg twice daily for 7 days (see instructions).  She took the entire 800 mg dose today, advised her to not take any more amiodarone today, monitor HR/BP and resume with taking it correctly tomorrow.   Advised I would send a message to our pharmacy team for any other recommendations, or concerns to look for.   Thanks!

## 2022-04-22 ENCOUNTER — Encounter (HOSPITAL_BASED_OUTPATIENT_CLINIC_OR_DEPARTMENT_OTHER): Payer: Self-pay | Admitting: Family

## 2022-04-23 ENCOUNTER — Telehealth: Payer: Self-pay

## 2022-04-23 NOTE — Telephone Encounter (Signed)
     Patient  visit on 04/19/2022  at [facility] was for shortness of breath.  Have you been able to follow up with your primary care physician? Patient followed up with Cardiologist.  The patient was or was not able to obtain any needed medicine or equipment. No medication prescribed.   Are there diet recommendations that you are having difficulty following? No  Patient expresses understanding of discharge instructions and education provided has no other needs at this time. Yes   Whitney Medina [facility]  [facility] Population Health Community Resource Care Guide   [email or web address]  ?? [phone]   Website: [email or web address]  [email or web address]

## 2022-05-06 DIAGNOSIS — R5383 Other fatigue: Secondary | ICD-10-CM | POA: Diagnosis not present

## 2022-05-06 DIAGNOSIS — I480 Paroxysmal atrial fibrillation: Secondary | ICD-10-CM | POA: Diagnosis not present

## 2022-05-06 DIAGNOSIS — R0609 Other forms of dyspnea: Secondary | ICD-10-CM | POA: Diagnosis not present

## 2022-05-06 DIAGNOSIS — Z79899 Other long term (current) drug therapy: Secondary | ICD-10-CM | POA: Diagnosis not present

## 2022-05-06 LAB — CBC
Hematocrit: 45.4 % (ref 34.0–46.6)
Hemoglobin: 14.7 g/dL (ref 11.1–15.9)
MCH: 30.5 pg (ref 26.6–33.0)
MCHC: 32.4 g/dL (ref 31.5–35.7)
MCV: 94 fL (ref 79–97)
Platelets: 214 10*3/uL (ref 150–450)
RBC: 4.82 x10E6/uL (ref 3.77–5.28)
RDW: 13.4 % (ref 11.7–15.4)
WBC: 6.3 10*3/uL (ref 3.4–10.8)

## 2022-05-18 ENCOUNTER — Other Ambulatory Visit: Payer: Self-pay | Admitting: Internal Medicine

## 2022-05-18 DIAGNOSIS — Z1231 Encounter for screening mammogram for malignant neoplasm of breast: Secondary | ICD-10-CM

## 2022-05-19 ENCOUNTER — Ambulatory Visit (INDEPENDENT_AMBULATORY_CARE_PROVIDER_SITE_OTHER): Payer: Medicare HMO

## 2022-05-19 DIAGNOSIS — Z79899 Other long term (current) drug therapy: Secondary | ICD-10-CM | POA: Diagnosis not present

## 2022-05-19 DIAGNOSIS — I480 Paroxysmal atrial fibrillation: Secondary | ICD-10-CM

## 2022-05-19 DIAGNOSIS — R0609 Other forms of dyspnea: Secondary | ICD-10-CM | POA: Diagnosis not present

## 2022-05-19 DIAGNOSIS — R5383 Other fatigue: Secondary | ICD-10-CM

## 2022-05-19 LAB — ECHOCARDIOGRAM COMPLETE
Area-P 1/2: 5.54 cm2
P 1/2 time: 451 msec
S' Lateral: 2.82 cm

## 2022-05-21 ENCOUNTER — Encounter (HOSPITAL_BASED_OUTPATIENT_CLINIC_OR_DEPARTMENT_OTHER): Payer: Self-pay | Admitting: Family

## 2022-05-21 ENCOUNTER — Ambulatory Visit (HOSPITAL_BASED_OUTPATIENT_CLINIC_OR_DEPARTMENT_OTHER): Payer: Medicare HMO | Admitting: Family

## 2022-05-21 VITALS — BP 122/60 | HR 72 | Ht 69.5 in | Wt 147.5 lb

## 2022-05-21 DIAGNOSIS — D6859 Other primary thrombophilia: Secondary | ICD-10-CM

## 2022-05-21 DIAGNOSIS — I480 Paroxysmal atrial fibrillation: Secondary | ICD-10-CM | POA: Diagnosis not present

## 2022-05-21 DIAGNOSIS — Z86711 Personal history of pulmonary embolism: Secondary | ICD-10-CM | POA: Diagnosis not present

## 2022-05-21 DIAGNOSIS — Z79899 Other long term (current) drug therapy: Secondary | ICD-10-CM

## 2022-05-21 DIAGNOSIS — I10 Essential (primary) hypertension: Secondary | ICD-10-CM | POA: Diagnosis not present

## 2022-05-21 DIAGNOSIS — E785 Hyperlipidemia, unspecified: Secondary | ICD-10-CM | POA: Diagnosis not present

## 2022-05-21 DIAGNOSIS — I7121 Aneurysm of the ascending aorta, without rupture: Secondary | ICD-10-CM | POA: Diagnosis not present

## 2022-05-21 MED ORDER — AMIODARONE HCL 200 MG PO TABS: 200.0000 mg | ORAL_TABLET | Freq: Every day | ORAL | 3 refills | Status: DC

## 2022-05-21 NOTE — Progress Notes (Unsigned)
Office Visit    Patient Name: Whitney Medina Date of Encounter: 05/21/2022  PCP:  Chilton Greathouse, MD   [facility]  Cardiologist:  Olga Millers, MD  Advanced Practice Provider:  No care team member to display Electrophysiologist:  None      Chief Complaint    Whitney Medina is a 89 y.o. female presents today for follow up after addition of Amiodarone.   Past Medical History    Past Medical History:  Diagnosis Date   Ascending aortic aneurysm (HCC)    3.8 x 4 cm on CTA 12/2013   CAD (coronary artery disease)    a. Chest CTA 01/16/14:  IMPRESSION: 1. Negative for pulmonary embolus or acute vascular abnormality. 2. Atherosclerosis and coronary artery disease. 3. Unchanged ascending aortic ectasia measuring 38 mm x 40 mm.  Recommend annual imaging followup by CTA or MRA   Cardiomyopathy [facility])    a.  Echocardiogram 12/3013:  Left ventricle: Systolic function was mildly to moderately reduced. The estimated ejection fraction was in the range of 40% to 45%. Regional wall motion abnormalities cannot be excluded.  The study is not technically sufficient to allow evaluation of LV diastolic function.   DVT (deep venous thrombosis) (HCC)    History of anemia    Hx of cardiovascular stress test    Lexiscan Myoview (01/2014):  No ischemia or scar, EF 70%; Normal Study/Low Risk   Hypercholesteremia    Hypothyroidism    Left knee DJD    PAF (paroxysmal atrial fibrillation) (HCC)    PE (pulmonary embolism)    Pulmonary embolism (HCC)    Raynaud disease    Past Surgical History:  Procedure Laterality Date   ABDOMINAL HYSTERECTOMY  01/19/1971   FROZEN SHOULDER     JOINT REPLACEMENT     KNEE ARTHROSCOPY Left    SHOULDER ADHESION RELEASE Left 01/18/1998   TONSILLECTOMY  01/19/1959   TOTAL KNEE ARTHROPLASTY Left 12/25/2012   Procedure: TOTAL KNEE ARTHROPLASTY- left;  Surgeon: Nilda Simmer, MD;  Location: [facility] OR;  Service: Orthopedics;  Laterality:  Left;    Allergies  Allergies  Allergen Reactions   Codeine Nausea And Vomiting    Percocet OK   Crestor [Rosuvastatin] Nausea Only    History of Present Illness    Whitney Medina is a 89 y.o. female with a hx of syncope, atrial fibrillation, coronary artery calcification on CT, hyperlipidemia last seen 04/20/22.  Admitted with new onset atrial fibrillation and syncopal episode December 2015 with a EF 40 to 45%.  Additional prior cardiac workup includes Lexiscan Myoview 01/2014 with no ischemia and EF 72%.  CTA September 2018 with ectatic ascending thoracic aorta measuring up to 37 mm.  CTA April 2022 with small dissection flap in the left proximal subclavian artery recommend for follow-up with vascular surgery.  Echo 06/2020 normal LVEF.  Monitor 09/2021 sinus rhythm with episodes of SVT longest 13 seconds.  Last saw Dr. Jens Som 03/30/2022.  She was doing overall well with the suggestion of mild dyspnea with more than usual activity.  It was noted that she had previously been prescribed amiodarone but did not initiate as she was concerned about potential side effects.  Seen in the ED 04/19/22 after presenting with shortness of breath and dizziness.  She also reported dark blood stool for 1 month.  Hemoglobin is 15.  Appointment with GI not until 06/02/22. Metabolic panel with no acute abnormalities.  Fecal occult was positive. EKG NSR but  was told she was in atrial fibrillation via telemetry monitoring. At follow up 04/20/22 Amiodarone initiated.   Presents today for follow up. Notes she is feeling a lot better. No recurrent lightheadedness. Able to complete mor activity. [place] [facility] [employer] [place] and was able to walk with the group for 2 hours. No dark stool since ED visit. Does have known hemorrhoids. Upcoming appointment to establish with GI.   EKGs/Labs/Other Studies Reviewed:   The following studies were reviewed today: Cardiac Studies & Procedures        ECHOCARDIOGRAM  ECHOCARDIOGRAM COMPLETE 05/19/2022  Narrative ECHOCARDIOGRAM REPORT    Patient Name:   Whitney Medina Date of Exam: 05/19/2022 Medical Rec #:  [id number]             Height:       69.5 in Accession #:    [id number]            Weight:       143.0 lb Date of Birth:  [birth year]             BSA:          1.800 m Patient Age:    89 years              BP:           120/60 mmHg Patient Gender: F                     HR:           64 bpm. Exam Location:  Outpatient  Procedure: 2D Echo, 3D Echo, Cardiac Doppler, Color Doppler and Strain Analysis  Indications:    I48.91* Unspeicified atrial fibrillation  History:        Patient has prior history of Echocardiogram examinations, most recent 07/01/2020. Arrythmias:Atrial Fibrillation, Signs/Symptoms:Syncope; Risk Factors:Dyslipidemia and Non-Smoker. Pulmonary Embolism, Ascending Aortic Aneurysm.  Sonographer:    [name] RDCS Referring Phys: [id number] Toren Tucholski S Kalan Yeley  IMPRESSIONS   1. Left ventricular ejection fraction, by estimation, is 60 to 65%. Left ventricular ejection fraction by 3D volume is 62 %. The left ventricle has normal function. The left ventricle has no regional wall motion abnormalities. Left ventricular diastolic parameters were normal. 2. Right ventricular systolic function is normal. The right ventricular size is normal. There is normal pulmonary artery systolic pressure. The estimated right ventricular systolic pressure is 30.5 mmHg. 3. Left atrial size was mildly dilated. 4. The mitral valve is normal in structure. Mild to moderate mitral valve regurgitation. No evidence of mitral stenosis. 5. The aortic valve is tricuspid. There is mild calcification of the aortic valve. There is mild thickening of the aortic valve. Aortic valve regurgitation is mild. Aortic valve sclerosis/calcification is present, without any evidence of aortic stenosis. 6. There is Moderate (Grade III) protruding plaque  involving the aortic arch. 7. The inferior vena cava is normal in size with greater than 50% respiratory variability, suggesting right atrial pressure of 3 mmHg.  Comparison(s): No significant change from prior study. Prior images reviewed side by side.  FINDINGS Left Ventricle: Left ventricular ejection fraction, by estimation, is 60 to 65%. Left ventricular ejection fraction by 3D volume is 62 %. The left ventricle has normal function. The left ventricle has no regional wall motion abnormalities. Global longitudinal strain performed but not reported based on interpreter judgement due to suboptimal tracking. The left ventricular internal cavity size was normal in size. There is no left ventricular hypertrophy. Left  ventricular diastolic parameters were normal.  Right Ventricle: The right ventricular size is normal. No increase in right ventricular wall thickness. Right ventricular systolic function is normal. There is normal pulmonary artery systolic pressure. The tricuspid regurgitant velocity is 2.62 m/s, and with an assumed right atrial pressure of 3 mmHg, the estimated right ventricular systolic pressure is 30.5 mmHg.  Left Atrium: Left atrial size was mildly dilated.  Right Atrium: Right atrial size was normal in size.  Pericardium: There is no evidence of pericardial effusion.  Mitral Valve: The mitral valve is normal in structure. Mild mitral annular calcification. Mild to moderate mitral valve regurgitation, with centrally-directed jet. No evidence of mitral valve stenosis.  Tricuspid Valve: The tricuspid valve is normal in structure. Tricuspid valve regurgitation is trivial. No evidence of tricuspid stenosis.  Aortic Valve: The aortic valve is tricuspid. There is mild calcification of the aortic valve. There is mild thickening of the aortic valve. Aortic valve regurgitation is mild. Aortic regurgitation PHT measures 451 msec. Aortic valve sclerosis/calcification is present, without  any evidence of aortic stenosis.  Pulmonic Valve: The pulmonic valve was grossly normal. Pulmonic valve regurgitation is mild to moderate. No evidence of pulmonic stenosis.  Aorta: The aortic root and ascending aorta are structurally normal, with no evidence of dilitation. There is moderate (Grade III) protruding plaque involving the aortic arch.  Venous: The inferior vena cava is normal in size with greater than 50% respiratory variability, suggesting right atrial pressure of 3 mmHg.  IAS/Shunts: No atrial level shunt detected by color flow Doppler.   LEFT VENTRICLE PLAX 2D LVIDd:         4.59 cm         Diastology LVIDs:         2.82 cm         LV e' medial:    8.49 cm/s LV PW:         0.94 cm         LV E/e' medial:  12.2 LV IVS:        0.84 cm         LV e' lateral:   10.60 cm/s LVOT diam:     1.80 cm         LV E/e' lateral: 9.8 LV SV:         39 LV SV Index:   22 LVOT Area:     2.54 cm        3D Volume EF LV 3D EF:    Left ventricul ar ejection fraction by 3D volume is 62 %.  3D Volume EF: 3D EF:        62 % LV EDV:       95 ml LV ESV:       36 ml LV SV:        59 ml  RIGHT VENTRICLE RV Basal diam:  3.43 cm RV Mid diam:    2.70 cm RV S prime:     14.60 cm/s TAPSE (M-mode): 2.5 cm  LEFT ATRIUM             Index        RIGHT ATRIUM           Index LA diam:        4.10 cm 2.28 cm/m   RA Area:     16.40 cm LA Vol (A2C):   72.8 ml 40.44 ml/m  RA Volume:   43.10 ml  23.94 ml/m LA Vol (A4C):  56.0 ml 31.10 ml/m LA Biplane Vol: 66.8 ml 37.10 ml/m AORTIC VALVE LVOT Vmax:   67.10 cm/s LVOT Vmean:  43.500 cm/s LVOT VTI:    0.153 m AI PHT:      451 msec  AORTA Ao Root diam: 3.70 cm Ao Asc diam:  3.70 cm  MITRAL VALVE                TRICUSPID VALVE MV Area (PHT): 5.54 cm     TR Peak grad:   27.5 mmHg MV Decel Time: 137 msec     TR Vmax:        262.00 cm/s MV E velocity: 104.00 cm/s MV A velocity: 55.40 cm/s   SHUNTS MV E/A ratio:  1.88         Systemic  VTI:  0.15 m Systemic Diam: 1.80 cm  Mihai Croitoru MD Electronically signed by Thurmon Fair MD Signature Date/Time: 05/19/2022/4:32:56 PM    Final    MONITORS  LONG TERM MONITOR-LIVE TELEMETRY (3-14 DAYS) 09/25/2021  Narrative Patch Wear Time:  13 days and 16 hours  Predominant rhythm is sinus rhythm Multiple SVT runs, longest 13 seconds Less than 1% ventricular and supraventricular ectopy No triggered episodes recorded  Will Camnitz, MD           EKG:  EKG is ordered today.  EKG performed today demonstrates normal sinus rhythm 67 bpm with sinus arrhythmia and QTc 423 ms  Recent Labs: 09/03/2021: B Natriuretic Peptide 253.2; TSH 0.682 04/19/2022: ALT 15; BUN 15; Creatinine, Ser 0.90; Potassium 4.5; Sodium 139 05/06/2022: Hemoglobin 14.7; Platelets 214  Recent Lipid Panel    Component Value Date/Time   CHOL 141 03/12/2021 0943   TRIG 69 03/12/2021 0943   HDL 70 03/12/2021 0943   CHOLHDL 2.0 03/12/2021 0943   LDLCALC 57 03/12/2021 0943    Risk Assessment/Calculations:   CHA2DS2-VASc Score = 5   This indicates a 7.2% annual risk of stroke. The patient's score is based upon: CHF History: 1 HTN History: 0 Diabetes History: 0 Stroke History: 0 Vascular Disease History: 1 Age Score: 2 Gender Score: 1     Home Medications   Current Meds  Medication Sig   acetaminophen (TYLENOL) 500 MG tablet Take 1,000 mg by mouth every 6 (six) hours as needed for moderate pain or fever (fever).    amiodarone (PACERONE) 200 MG tablet Take 2 tablets (400 mg total) by mouth 2 (two) times daily for 7 days, THEN 1 tablet (200 mg total) 2 (two) times daily for 7 days, THEN 1 tablet (200 mg total) daily.   atorvastatin (LIPITOR) 20 MG tablet Take 20 mg by mouth every evening.   B Complex Vitamins (B COMPLEX-B12 PO) Take 1 tablet by mouth daily.   Calcium Citrate-Vitamin D (CALCIUM + D PO) Take 600 mg by mouth daily.   Carboxymeth-Glyc-Polysorb PF (REFRESH OPTIVE MEGA-3) 0.5-1-0.5 % SOLN  one drop in both eyes twice a day   cholecalciferol (VITAMIN D) 1000 units tablet Take 1 capsule by mouth daily.   Cinnamon 500 MG capsule Take 500 mg by mouth daily.   diltiazem (CARDIZEM) 30 MG tablet TAKE 1 TABLET TWICE DAILY   levothyroxine (SYNTHROID) 88 MCG tablet Take 88 mcg by mouth daily before breakfast.   omega-3 acid ethyl esters (LOVAZA) 1 g capsule Take 1 g by mouth daily.   Rivaroxaban (XARELTO) 15 MG TABS tablet TAKE 1 TABLET (15 MG TOTAL) BY MOUTH DAILY WITH SUPPER   traZODone (DESYREL) 50 MG tablet Take  50 mg by mouth at bedtime.   vitamin C (ASCORBIC ACID) 500 MG tablet Take 500 mg by mouth daily.      Review of Systems      All other systems reviewed and are otherwise negative except as noted above.  Physical Exam    VS:  BP 122/60   Pulse 72   Ht 5' 9.5" (1.765 m)   Wt 147 lb 8 oz (66.9 kg)   BMI 21.47 kg/m  , BMI Body mass index is 21.47 kg/m.  Wt Readings from Last 3 Encounters:  05/21/22 147 lb 8 oz (66.9 kg)  04/20/22 143 lb (64.9 kg)  04/19/22 143 lb (64.9 kg)     GEN: Well nourished, well developed, in no acute distress. HEENT: normal. Neck: Supple, no JVD, carotid bruits, or masses. Cardiac: IRIR, no murmurs, rubs, or gallops. No clubbing, cyanosis, edema.  Radials/PT 2+ and equal bilaterally.  Respiratory:  Respirations regular and unlabored, clear to auscultation bilaterally. GI: Soft, nontender, nondistended. MS: No deformity or atrophy. Skin: Warm and dry, no rash. Neuro:  Strength and sensation are intact. Psych: Normal affect.  Assessment & Plan    PAF / Hypercoagulable state / on Amiodarone therapy - Echo 05/19/22 normal LVEF, mild to moderate MR, mild AI, aortic plaque. Maintaining NSR and improvement in energy level, exercise tolerance on Amiodarone. Continue Amiodarone 200mg  QD, Diltiazem 30mg  BID, Xarelto 15mg  QD.CHA2DS2-VASc Score = 5 [CHF History: 1, HTN History: 0, Diabetes History: 0, Stroke History: 0, Vascular Disease History: 1,  Age Score: 2, Gender Score: 1].  Therefore, the patient's annual risk of stroke is 7.2 %.     Amiodarone monitoring: recommended annual eye exam. TSH/LFT in 2 weeks as will have completed 6 weeks of therapy.   Positive Hemoccult-positive Hemoccult in ED 04/19/2022 however hemoglobin 15. Repeat CBC 05/06/22 Hb 14.7. Upcoming appointment to establish with GI.   CAD - based on coronary calcification on CT. Stable with no anginal symptoms. No indication for ischemic evaluation.  GDMT atorvastatin 20mg  QD. No ASA due to [facility]. Heart healthy diet and regular cardiovascular exercise encouraged.    History of thoracic aortic aneurysm/dissection subclavian-follows with [facility] and recommended for medical therapy.  Continue atorvastatin.  No aspirin due to [facility].  HLD- Continue Atorvastatin 20mg  QD. Continue to follow with PCP.   Prior PE-continue Xarelto due to need for OAC with PAF.  History of cardiomyopathy -previously felt to be secondary to tachycardia.  LVEF has recovered.Euvolemic and well compensated on exam.          Disposition: Follow up in 6 month(s) with Olga Millers, MD or APP.  Signed, Alver Sorrow, NP 05/21/2022, 2:17 PM  [facility]

## 2022-05-21 NOTE — Patient Instructions (Addendum)
Medication Instructions:  Your physician recommends that you continue on your current medications as directed. Please refer to the Current Medication list given to you today.  *If you need a refill on your cardiac medications before your next appointment, please call your pharmacy*  Labs:  Please return for Lab work in 2 weeks for TSH and LFTs. You may come to the...   [facility] (3rd floor) [address] [place], [place] [address]  Open: 8am-Noon and 1pm-4:30pm  Please ring the doorbell on the small table when you exit the elevator and the Lab Tech will come get you  [facility] at [facility] [address], [place], [place] [address] Open: 8am-1pm, then 2pm-4:30pm   [facility]- Please see attached locations sheet stapled to your lab work with address and hours.   Follow-Up: At [facility], you and your health needs are our priority.  As part of our continuing mission to provide you with exceptional heart care, we have created designated Provider Care Teams.  These Care Teams include your primary Cardiologist (physician) and Advanced Practice Providers (APPs -  Physician Assistants and Nurse Practitioners) who all work together to provide you with the care you need, when you need it.  We recommend signing up for the patient portal called "MyChart".  Sign up information is provided on this After Visit Summary.  MyChart is used to connect with patients for Virtual Visits (Telemedicine).  Patients are able to view lab/test results, encounter notes, upcoming appointments, etc.  Non-urgent messages can be sent to your provider as well.   To learn more about what you can do with MyChart, go to [email or web address].    Your next appointment:   6 month(s)  Provider:   Olga Millers, MD

## 2022-05-22 ENCOUNTER — Encounter (HOSPITAL_BASED_OUTPATIENT_CLINIC_OR_DEPARTMENT_OTHER): Payer: Self-pay | Admitting: Family

## 2022-06-02 ENCOUNTER — Other Ambulatory Visit (INDEPENDENT_AMBULATORY_CARE_PROVIDER_SITE_OTHER): Payer: Medicare HMO

## 2022-06-02 ENCOUNTER — Ambulatory Visit: Payer: Medicare HMO | Admitting: Nurse Practitioner

## 2022-06-02 ENCOUNTER — Encounter: Payer: Self-pay | Admitting: Nurse Practitioner

## 2022-06-02 ENCOUNTER — Telehealth: Payer: Self-pay

## 2022-06-02 DIAGNOSIS — Z7901 Long term (current) use of anticoagulants: Secondary | ICD-10-CM

## 2022-06-02 DIAGNOSIS — R195 Other fecal abnormalities: Secondary | ICD-10-CM

## 2022-06-02 LAB — IBC + FERRITIN
Ferritin: 81.1 ng/mL (ref 10.0–291.0)
Iron: 73 ug/dL (ref 42–145)
Saturation Ratios: 19.3 % — ABNORMAL LOW (ref 20.0–50.0)
TIBC: 378 ug/dL (ref 250.0–450.0)
Transferrin: 270 mg/dL (ref 212.0–360.0)

## 2022-06-02 NOTE — Telephone Encounter (Signed)
 [facility] Pre-operative Risk Assessment     Request for surgical clearance:     Endoscopy Procedure  What type of surgery is being performed?     endoscopy  When is this surgery scheduled?     06-24-22  What type of clearance is required ?   Pharmacy  Are there any medications that need to be held prior to surgery and how long? Xarelto x 2 days  Practice name and name of physician performing surgery?      [facility]  What is your office phone and fax number?      Phone- [phone]  Fax- [phone]  Anesthesia type (None, local, MAC, general) ?       MAC

## 2022-06-02 NOTE — Telephone Encounter (Signed)
Whitney Medina, you saw this patient on 05/21/2022 who is now seeking cardiac risk assessment for endoscopy.  Could you please comment on cardiac risk and send your response to p cv div preop?  Thank you, Marcelino Duster

## 2022-06-02 NOTE — Patient Instructions (Signed)
_______________________________________________________  If your blood pressure at your visit was 140/90 or greater, please contact your primary care physician to follow up on this. _______________________________________________________  If you are age 87 or older, your body mass index should be between 23-30. Your Body mass index is 22.56 kg/m. If this is out of the aforementioned range listed, please consider follow up with your Primary Care Provider. ________________________________________________________  The [facility] providers would like to encourage you to use [facility] to communicate with providers for non-urgent requests or questions.  Due to long hold times on the telephone, sending your provider a message by [facility] may be a faster and more efficient way to get a response.  Please allow 48 business hours for a response.  Please remember that this is for non-urgent requests.  _______________________________________________________  Bonita Quin have been scheduled for an endoscopy. Please follow written instructions given to you at your visit today. If you use inhalers (even only as needed), please bring them with you on the day of your procedure.  Due to recent changes in healthcare laws, you may see the results of your imaging and laboratory studies on MyChart before your provider has had a chance to review them.  We understand that in some cases there may be results that are confusing or concerning to you. Not all laboratory results come back in the same time frame and the provider may be waiting for multiple results in order to interpret others.  Please give [place] 48 hours in order for your provider to thoroughly review all the results before contacting the office for clarification of your results.   Your provider has requested that you go to the basement level for lab work before leaving today. Press "B" on the elevator. The lab is located at the first door on the left as you exit the  elevator.  Thank you for entrusting me with your care and choosing [facility].  Willette Cluster, NP

## 2022-06-02 NOTE — Progress Notes (Addendum)
Assessment / Plan   Primary GI: new - [name], MD  89 y.o. yo female with a past medical history consisting of, but not necessarily limited to paroxysmal Afib on Xarelto, thoracic aortic aneurysm, history of PE hypothyroidism. Patient is referred by PCP for evaluation of dark stool  Intermittent dark stool for several months and recent Hemoccult positive stool ( recent ED visit).  She also periodically has a scant amount of rectal bleeding with bowel movements which she attributes to hemorrhoids .Despite the dark stools and occasional rectal bleeding over the last few months her hemoglobin is normal at 14.7.   -Obtain iron studies -We discussed the possibility of intermittent GI bleeding on Xarelto, specifically upper GI bleeding but small bowel or right colon lesion also possible.  The risks and benefits of EGD with possible biopsies were discussed with the patient who agrees to proceed.  -Patient does not want to have a colonoscopy. She tells me she wouldn't seek treatment if colon cancer was diagnosed -Hold Xarelto for 2 days before procedure - will instruct when and how to resume after procedure. Patient understands that there is a low but real risk of cardiovascular event such as heart attack, stroke, or embolism /  thrombosis while off Medina thinner. The patient consents to proceed. Will communicate by phone or EMR with patient's prescribing provider to confirm that holding Xarelto is reasonable in this case.  Addendum: Patient called back. She has changed her mind and wants to proceed with colonoscopy at time of EGD.   Paroxysmal atrial fibrillation, on Eliquis.  Recently started on amiodarone   History of Present Illness   Chief Complaint:  dark stool  Patient seen in the ED for 04/19/22 for evaluation of Medina in stool, shortness of breath and dyspnea.  In the ED she was Hemoccult positive with a normal hemoglobin.  She was advised to follow-up with GI and was discharged  home from the ED. Patient had a follow-up with cardiology on 04/20/22.  She was started on amiodarone.    Whitney Medina gives a history of Intermittent dark stools for six months. This occurs about once a week.  She also periodically passes a scant amount of bright red Medina with her bowel movements which she attributes to hemorrhoids.  She has no rectal pain.  No abdominal pain. No N/V, her weight is stable. She doesn't take NSAIDs. No history of PUD.   Last colonoscopy was 15-20 years ago, apparently no polyps found.  She does not want to have another colonoscopy  Hgb 14.7     Latest Ref Rng & Units 05/06/2022    1:15 PM 04/19/2022   10:46 AM 10/29/2021    9:56 AM  CBC  WBC 3.4 - 10.8 x10E3/uL 6.3  6.5  5.7   Hemoglobin 11.1 - 15.9 g/dL 16.1  09.6  04.5   Hematocrit 34.0 - 46.6 % 45.4  44.8  42.8   Platelets 150 - 450 x10E3/uL 214  177  170     Lab Results  Component Value Date   LIPASE <10 (L) 05/15/2020      Latest Ref Rng & Units 04/19/2022   10:46 AM 03/12/2021    9:43 AM 05/21/2020    6:33 PM  CMP  Glucose 70 - 99 mg/dL 409  811  97   BUN 8 - 23 mg/dL 15  15  11    Creatinine 0.44 - 1.00 mg/dL 9.14  7.82  9.56   Sodium 135 - 145  mmol/L 139  140  143   Potassium 3.5 - 5.1 mmol/L 4.5  4.9  4.4   Chloride 98 - 111 mmol/L 100  102  103   CO2 22 - 32 mmol/L 30  28  28    Calcium 8.9 - 10.3 mg/dL 16.1  9.5  9.6   Total Protein 6.5 - 8.1 g/dL 7.6  6.5    Total Bilirubin 0.3 - 1.2 mg/dL 0.8  0.6    Alkaline Phos 38 - 126 U/L 77  84    AST 15 - 41 U/L 20  25    ALT 0 - 44 U/L 15  14       ECHOCARDIOGRAM COMPLETE    ECHOCARDIOGRAM REPORT       Patient Name:   Whitney Medina Date of Exam: 05/19/2022 Medical Rec #:  [id number]             Height:       69.5 in Accession #:    [id number]            Weight:       143.0 lb Date of Birth:  12/07/1932             BSA:          1.800 m Patient Age:    89 years              BP:           120/60 mmHg Patient Gender: F                      HR:           64 bpm. Exam Location:  Outpatient  Procedure: 2D Echo, 3D Echo, Cardiac Doppler, Color Doppler and Strain Analysis  Indications:    I48.91* Unspeicified atrial fibrillation   History:        Patient has prior history of Echocardiogram examinations, most                 recent 07/01/2020. Arrythmias:Atrial Fibrillation,                 Signs/Symptoms:Syncope; Risk Factors:Dyslipidemia and                 Non-Smoker. Pulmonary Embolism, Ascending Aortic Aneurysm.   Sonographer:    [name] RDCS Referring Phys: [id number] CAITLIN S WALKER  IMPRESSIONS   1. Left ventricular ejection fraction, by estimation, is 60 to 65%. Left ventricular ejection fraction by 3D volume is 62 %. The left ventricle has normal function. The left ventricle has no regional wall motion abnormalities. Left ventricular diastolic  parameters were normal.  2. Right ventricular systolic function is normal. The right ventricular size is normal. There is normal pulmonary artery systolic pressure. The estimated right ventricular systolic pressure is 30.5 mmHg.  3. Left atrial size was mildly dilated.  4. The mitral valve is normal in structure. Mild to moderate mitral valve regurgitation. No evidence of mitral stenosis.  5. The aortic valve is tricuspid. There is mild calcification of the aortic valve. There is mild thickening of the aortic valve. Aortic valve regurgitation is mild. Aortic valve sclerosis/calcification is present, without any evidence of aortic  stenosis.  6. There is Moderate (Grade III) protruding plaque involving the aortic arch.  7. The inferior vena cava is normal in size with greater than 50% respiratory variability, suggesting right atrial pressure of 3 mmHg.  Comparison(s): No significant change from  prior study. Prior images reviewed side by side.  FINDINGS  Left Ventricle: Left ventricular ejection fraction, by estimation, is 60 to 65%. Left ventricular ejection fraction by 3D  volume is 62 %. The left ventricle has normal function. The left ventricle has no regional wall motion abnormalities. Global  longitudinal strain performed but not reported based on interpreter judgement due to suboptimal tracking. The left ventricular internal cavity size was normal in size. There is no left ventricular hypertrophy. Left ventricular diastolic parameters were  normal.  Right Ventricle: The right ventricular size is normal. No increase in right ventricular wall thickness. Right ventricular systolic function is normal. There is normal pulmonary artery systolic pressure. The tricuspid regurgitant velocity is 2.62 m/s, and  with an assumed right atrial pressure of 3 mmHg, the estimated right ventricular systolic pressure is 30.5 mmHg.  Left Atrium: Left atrial size was mildly dilated.  Right Atrium: Right atrial size was normal in size.  Pericardium: There is no evidence of pericardial effusion.  Mitral Valve: The mitral valve is normal in structure. Mild mitral annular calcification. Mild to moderate mitral valve regurgitation, with centrally-directed jet. No evidence of mitral valve stenosis.  Tricuspid Valve: The tricuspid valve is normal in structure. Tricuspid valve regurgitation is trivial. No evidence of tricuspid stenosis.  Aortic Valve: The aortic valve is tricuspid. There is mild calcification of the aortic valve. There is mild thickening of the aortic valve. Aortic valve regurgitation is mild. Aortic regurgitation PHT measures 451 msec. Aortic valve  sclerosis/calcification is present, without any evidence of aortic stenosis.  Pulmonic Valve: The pulmonic valve was grossly normal. Pulmonic valve regurgitation is mild to moderate. No evidence of pulmonic stenosis.  Aorta: The aortic root and ascending aorta are structurally normal, with no evidence of dilitation. There is moderate (Grade III) protruding plaque involving the aortic arch.  Venous: The inferior vena cava  is normal in size with greater than 50% respiratory variability, suggesting right atrial pressure of 3 mmHg.  IAS/Shunts: No atrial level shunt detected by color flow Doppler.    LEFT VENTRICLE PLAX 2D LVIDd:         4.59 cm         Diastology LVIDs:         2.82 cm         LV e' medial:    8.49 cm/s LV PW:         0.94 cm         LV E/e' medial:  12.2 LV IVS:        0.84 cm         LV e' lateral:   10.60 cm/s LVOT diam:     1.80 cm         LV E/e' lateral: 9.8 LV SV:         39 LV SV Index:   22 LVOT Area:     2.54 cm        3D Volume EF                                LV 3D EF:    Left                                             ventricul  ar                                             ejection                                             fraction                                             by 3D                                             volume is                                             62 %.                                  3D Volume EF:                                3D EF:        62 %                                LV EDV:       95 ml                                LV ESV:       36 ml                                LV SV:        59 ml  RIGHT VENTRICLE RV Basal diam:  3.43 cm RV Mid diam:    2.70 cm RV S prime:     14.60 cm/s TAPSE (M-mode): 2.5 cm  LEFT ATRIUM             Index        RIGHT ATRIUM           Index LA diam:        4.10 cm 2.28 cm/m   RA Area:     16.40 cm LA Vol (A2C):   72.8 ml 40.44 ml/m  RA Volume:   43.10 ml  23.94 ml/m LA Vol (A4C):   56.0 ml 31.10 ml/m LA Biplane Vol: 66.8 ml 37.10 ml/m  AORTIC VALVE LVOT Vmax:   67.10 cm/s LVOT Vmean:  43.500 cm/s LVOT VTI:    0.153 m AI PHT:      451 msec   AORTA Ao Root diam: 3.70 cm Ao Asc diam:  3.70 cm  MITRAL VALVE                TRICUSPID VALVE MV Area (PHT): 5.54 cm     TR Peak grad:   27.5 mmHg MV Decel Time: 137 msec     TR Vmax:         262.00 cm/s MV E velocity: 104.00 cm/s MV A velocity: 55.40 cm/s   SHUNTS MV E/A ratio:  1.88         Systemic VTI:  0.15 m                             Systemic Diam: 1.80 cm  Rachelle Hora Croitoru MD Electronically signed by Thurmon Fair MD Signature Date/Time: 05/19/2022/4:32:56 PM      Final      Past Medical History:  Diagnosis Date   Ascending aortic aneurysm (HCC)    3.8 x 4 cm on CTA 12/2013   CAD (coronary artery disease)    a. Chest CTA 01/16/14:  IMPRESSION: 1. Negative for pulmonary embolus or acute vascular abnormality. 2. Atherosclerosis and coronary artery disease. 3. Unchanged ascending aortic ectasia measuring 38 mm x 40 mm.  Recommend annual imaging followup by CTA or MRA   Cardiomyopathy [facility])    a.  Echocardiogram 12/3013:  Left ventricle: Systolic function was mildly to moderately reduced. The estimated ejection fraction was in the range of 40% to 45%. Regional wall motion abnormalities cannot be excluded.  The study is not technically sufficient to allow evaluation of LV diastolic function.   DVT (deep venous thrombosis) (HCC)    History of anemia    Hx of cardiovascular stress test    Lexiscan Myoview (01/2014):  No ischemia or scar, EF 70%; Normal Study/Low Risk   Hypercholesteremia    Hypothyroidism    Left knee DJD    PAF (paroxysmal atrial fibrillation) (HCC)    PE (pulmonary embolism)    Pulmonary embolism (HCC)    Raynaud disease    Past Surgical History:  Procedure Laterality Date   ABDOMINAL HYSTERECTOMY  01/19/1971   FROZEN SHOULDER     JOINT REPLACEMENT     KNEE ARTHROSCOPY Left    SHOULDER ADHESION RELEASE Left 01/18/1998   TONSILLECTOMY  01/19/1959   TOTAL KNEE ARTHROPLASTY Left 12/25/2012   Procedure: TOTAL KNEE ARTHROPLASTY- left;  Surgeon: Nilda Simmer, MD;  Location: [facility] OR;  Service: Orthopedics;  Laterality: Left;   Family History  Problem Relation Age of Onset   Cancer - Other Mother    Diabetes Father    Melanoma Sister    Heart  disease Sister 19       CABG   Heart attack Sister    Stroke Neg Hx    Hypertension Neg Hx    Social History   Tobacco Use   Smoking status: Never   Smokeless tobacco: Never   Tobacco comments:    Never smoke 09/03/21  Vaping Use   Vaping Use: Never used  Substance Use Topics   Alcohol use: No    Comment: Rare glass of wine   Drug use: No   Current Outpatient Medications  Medication Sig Dispense Refill   acetaminophen (TYLENOL) 500 MG tablet Take 1,000 mg by mouth every 6 (six) hours as needed for moderate pain or fever (fever).      amiodarone (PACERONE) 200 MG tablet Take 1 tablet (200 mg total) by mouth daily. 90 tablet 3   atorvastatin (LIPITOR) 20  MG tablet Take 20 mg by mouth every evening.     B Complex Vitamins (B COMPLEX-B12 PO) Take 1 tablet by mouth daily.     Calcium Citrate-Vitamin D (CALCIUM + D PO) Take 600 mg by mouth daily.     Carboxymeth-Glyc-Polysorb PF (REFRESH OPTIVE MEGA-3) 0.5-1-0.5 % SOLN one drop in both eyes twice a day     cholecalciferol (VITAMIN D) 1000 units tablet Take 1 capsule by mouth daily.     Cinnamon 500 MG capsule Take 500 mg by mouth daily.     diltiazem (CARDIZEM) 30 MG tablet TAKE 1 TABLET TWICE DAILY 180 tablet 3   levothyroxine (SYNTHROID) 88 MCG tablet Take 88 mcg by mouth daily before breakfast.     omega-3 acid ethyl esters (LOVAZA) 1 g capsule Take 1 g by mouth daily.     Rivaroxaban (XARELTO) 15 MG TABS tablet TAKE 1 TABLET (15 MG TOTAL) BY MOUTH DAILY WITH SUPPER 90 tablet 2   traZODone (DESYREL) 50 MG tablet Take 50 mg by mouth at bedtime.     vitamin C (ASCORBIC ACID) 500 MG tablet Take 500 mg by mouth daily.      No current facility-administered medications for this visit.   Allergies  Allergen Reactions   Codeine Nausea And Vomiting    Percocet OK   Crestor [Rosuvastatin] Nausea Only     Review of Systems: Positive for back pain, vision changes, fatigue, heart rhythm changes, excessive urination, nosebleeds,  shortness of breath.  All other systems reviewed and negative except where noted in HPI.   Wt Readings from Last 3 Encounters:  06/02/22 145 lb 2 oz (65.8 kg)  05/21/22 147 lb 8 oz (66.9 kg)  04/20/22 143 lb (64.9 kg)    Physical Exam:  Ht 5' 7.25" (1.708 m) Comment: height measured without shoes  Wt 145 lb 2 oz (65.8 kg)   BMI 22.56 kg/m  Constitutional:  Pleasant, generally well appearing female in no acute distress. Psychiatric:  Normal mood and affect. Behavior is normal. EENT: Pupils normal.  Conjunctivae are normal. No scleral icterus. Neck supple.  Cardiovascular: Normal rate, regular rhythm.  Pulmonary/chest: Effort normal and breath sounds normal. No wheezing, rales or rhonchi. Abdominal: Soft, nondistended, nontender. Bowel sounds active throughout. There are no masses palpable. No hepatomegaly. Rectal: small external hemorrhoids.  No fissure appreciated.  No stool or Medina in vault.  No rectal masses felt Neurological: Alert and oriented to person place and time. Skin: Skin is warm and dry. No rashes noted.  Willette Cluster, NP  06/02/2022, 11:19 AM  Cc:  Referring Provider Chilton Greathouse, MD

## 2022-06-03 NOTE — Telephone Encounter (Signed)
Patient with diagnosis of afib and DVT/PE on Xarelto for anticoagulation.    Procedure: endoscopy Date of procedure: 06/24/22  CHA2DS2-VASc Score = 5  This indicates a 7.2% annual risk of stroke. The patient's score is based upon: CHF History: 1 HTN History: 0 Diabetes History: 0 Stroke History: 0 Vascular Disease History: 1 Age Score: 2 Gender Score: 1    DVT/PE following TKR, had IVC filter and was on Xarelto for 6 months in 2015.  CrCl 104mL/min Platelet count 214K  Per office protocol, patient can hold Xarelto for 2 days prior to procedure as requested.    **This guidance is not considered finalized until pre-operative APP has relayed final recommendations.**

## 2022-06-03 NOTE — Telephone Encounter (Signed)
   Primary Cardiologist: Olga Millers, MD  Chart reviewed as part of pre-operative protocol coverage. Given past medical history and time since last visit, based on ACC/AHA guidelines, Whitney Medina would be at acceptable risk for the planned procedure without further cardiovascular testing.   Patient was advised that if she develops new symptoms prior to surgery to contact our office to arrange a follow-up appointment. She verbalized understanding.  Per office protocol, patient can hold Xarelto for 2 days prior to procedure as requested   I will route this recommendation to the requesting party via Epic fax function and remove from pre-op pool.  Please call with questions.  Levi Aland, NP-C  06/03/2022, 9:09 AM [address] Office [phone] Fax [phone]

## 2022-06-03 NOTE — Telephone Encounter (Signed)
Able to achieve >4 METS at last OV. Per AHA/ACC guidelines, she is deemed acceptable risk for the planned procedure without additional cardiovascular testing.   Alver Sorrow, NP

## 2022-06-03 NOTE — Progress Notes (Signed)
Agree with the assessment and plan as outlined by Paula Guenther, NP.  Tashya Alberty E. Heer Justiss, MD [facility]  

## 2022-06-04 ENCOUNTER — Telehealth: Payer: Self-pay

## 2022-06-04 DIAGNOSIS — Z79899 Other long term (current) drug therapy: Secondary | ICD-10-CM | POA: Diagnosis not present

## 2022-06-04 DIAGNOSIS — I480 Paroxysmal atrial fibrillation: Secondary | ICD-10-CM | POA: Diagnosis not present

## 2022-06-04 DIAGNOSIS — R0609 Other forms of dyspnea: Secondary | ICD-10-CM | POA: Diagnosis not present

## 2022-06-04 DIAGNOSIS — R195 Other fecal abnormalities: Secondary | ICD-10-CM

## 2022-06-04 DIAGNOSIS — E7800 Pure hypercholesterolemia, unspecified: Secondary | ICD-10-CM | POA: Diagnosis not present

## 2022-06-04 DIAGNOSIS — Z7901 Long term (current) use of anticoagulants: Secondary | ICD-10-CM

## 2022-06-04 DIAGNOSIS — R5383 Other fatigue: Secondary | ICD-10-CM | POA: Diagnosis not present

## 2022-06-04 MED ORDER — NA SULFATE-K SULFATE-MG SULF 17.5-3.13-1.6 GM/177ML PO SOLN: 1.0000 | ORAL | 0 refills | Status: DC

## 2022-06-04 NOTE — Telephone Encounter (Signed)
RE: Add Colon Received: Today Meredith Pel, NP  Lamona Curl, CMA Yes that is ok. Thanks       Previous Messages    Patient aware that colonoscopy was added to endoscopy and rescheduled for 07-01-22 with Dr Tomasa Rand.  New instructions sent to patient in MyChart.  Patient agreed to plan and verbalized understanding.  No further questions.

## 2022-06-04 NOTE — Telephone Encounter (Signed)
-----   Message from Meredith Pel, NP sent at 06/04/2022  9:07 AM EDT ----- Regarding: RE: Add Colon Yes that is ok. Thanks ----- Message ----- From: Lamona Curl, CMA Sent: 06/03/2022  11:18 AM EDT To: Meredith Pel, NP Subject: Add Colon                                      Richarda Blade,  This patient has now decided she would like to proceed with colonoscopy, but would only want to be put to sleep once.  She would like to add on colon to EGD scheduled for 07-01-22.  Is this OK?  Please advise.  Thank you Joni Reining

## 2022-06-04 NOTE — Telephone Encounter (Signed)
Patient advised that she has been given clearance to hold Xarelto 2 days prior to endo/colon scheduled for 07-01-22.  Patient advised to take last dose of Xarelto on 06-28-22, and she will be advised when to restart Xarelto by Dr Tomasa Rand after the procedure.  Patient agreed to plan and verbalized understanding.  No further questions.

## 2022-06-05 LAB — HEPATIC FUNCTION PANEL
ALT: 16 IU/L (ref 0–32)
AST: 26 IU/L (ref 0–40)
Albumin: 4.3 g/dL (ref 3.7–4.7)
Alkaline Phosphatase: 80 IU/L (ref 44–121)
Bilirubin Total: 0.5 mg/dL (ref 0.0–1.2)
Bilirubin, Direct: 0.17 mg/dL (ref 0.00–0.40)
Total Protein: 6.4 g/dL (ref 6.0–8.5)

## 2022-06-05 LAB — TSH: TSH: 2.16 u[IU]/mL (ref 0.450–4.500)

## 2022-06-24 ENCOUNTER — Encounter: Payer: Medicare HMO | Admitting: Gastroenterology

## 2022-06-25 ENCOUNTER — Ambulatory Visit
Admission: RE | Admit: 2022-06-25 | Discharge: 2022-06-25 | Disposition: A | Discharge status: Home / Self Care | Payer: Medicare HMO | Source: Ambulatory Visit | Attending: Internal Medicine | Admitting: Internal Medicine

## 2022-06-25 DIAGNOSIS — Z1231 Encounter for screening mammogram for malignant neoplasm of breast: Secondary | ICD-10-CM

## 2022-07-01 ENCOUNTER — Ambulatory Visit (AMBULATORY_SURGERY_CENTER): Payer: Medicare HMO | Admitting: Gastroenterology

## 2022-07-01 ENCOUNTER — Encounter: Payer: Self-pay | Admitting: Gastroenterology

## 2022-07-01 VITALS — BP 137/65 | HR 62 | Temp 98.2°F | Resp 16 | Ht 67.25 in | Wt 145.0 lb

## 2022-07-01 DIAGNOSIS — D123 Benign neoplasm of transverse colon: Secondary | ICD-10-CM

## 2022-07-01 DIAGNOSIS — B9681 Helicobacter pylori [H. pylori] as the cause of diseases classified elsewhere: Secondary | ICD-10-CM | POA: Diagnosis not present

## 2022-07-01 DIAGNOSIS — K2950 Unspecified chronic gastritis without bleeding: Secondary | ICD-10-CM | POA: Diagnosis not present

## 2022-07-01 DIAGNOSIS — D128 Benign neoplasm of rectum: Secondary | ICD-10-CM | POA: Diagnosis not present

## 2022-07-01 DIAGNOSIS — D122 Benign neoplasm of ascending colon: Secondary | ICD-10-CM | POA: Diagnosis not present

## 2022-07-01 DIAGNOSIS — K2970 Gastritis, unspecified, without bleeding: Secondary | ICD-10-CM

## 2022-07-01 DIAGNOSIS — K921 Melena: Secondary | ICD-10-CM | POA: Diagnosis not present

## 2022-07-01 DIAGNOSIS — D124 Benign neoplasm of descending colon: Secondary | ICD-10-CM

## 2022-07-01 DIAGNOSIS — Z7901 Long term (current) use of anticoagulants: Secondary | ICD-10-CM

## 2022-07-01 DIAGNOSIS — Z1211 Encounter for screening for malignant neoplasm of colon: Secondary | ICD-10-CM | POA: Diagnosis not present

## 2022-07-01 DIAGNOSIS — R195 Other fecal abnormalities: Secondary | ICD-10-CM | POA: Diagnosis not present

## 2022-07-01 MED ORDER — SODIUM CHLORIDE 0.9 % IV SOLN: 500.0000 mL | INTRAVENOUS | Status: DC

## 2022-07-01 NOTE — Op Note (Signed)
[facility] Patient Name: Michel Eskelson Procedure Date: 07/01/2022 1:56 PM MRN: [id number] Endoscopist: Lorin Picket E. Tomasa Rand , MD, [id number] Age: 89 Referring MD:  Date of Birth: [birth year] Gender: Female Account #: [id number] Procedure:                Upper GI endoscopy Indications:              Heme positive stool, Melena Medicines:                Monitored Anesthesia Care Procedure:                Pre-Anesthesia Assessment:                           - Prior to the procedure, a History and Physical                            was performed, and patient medications and                            allergies were reviewed. The patient's tolerance of                            previous anesthesia was also reviewed. The risks                            and benefits of the procedure and the sedation                            options and risks were discussed with the patient.                            All questions were answered, and informed consent                            was obtained. Prior Anticoagulants: The patient has                            taken Eliquis (apixaban), last dose was 2 days                            prior to procedure. ASA Grade Assessment: III - A                            patient with severe systemic disease. After                            reviewing the risks and benefits, the patient was                            deemed in satisfactory condition to undergo the                            procedure.  After obtaining informed consent, the endoscope was                            passed under direct vision. Throughout the                            procedure, the patient's blood pressure, pulse, and                            oxygen saturations were monitored continuously. The                            Olympus Scope [id number] was introduced through the                            mouth, and advanced to the second part of  duodenum.                            The upper GI endoscopy was accomplished without                            difficulty. The patient tolerated the procedure                            well. Scope In: Scope Out: Findings:                 The examined portions of the nasopharynx,                            oropharynx and larynx were normal.                           The examined esophagus was normal.                           Diffuse moderate inflammation characterized by                            erythema and granularity was found in the gastric                            body, fundus and at the incisura. Biopsies were                            taken with a cold forceps for Helicobacter pylori                            testing. Estimated blood loss was minimal.                           The exam of the stomach was otherwise normal.                           The examined duodenum was normal. Complications:  No immediate complications. Estimated Blood Loss:     Estimated blood loss was minimal. Impression:               - The examined portions of the nasopharynx,                            oropharynx and larynx were normal.                           - Normal esophagus.                           - Gastritis. Biopsied. Unlikely that this would                            cause melena. No mucosal breaks or hematin in the                            gastric lumen.                           - Normal examined duodenum. Recommendation:           - Patient has a contact number available for                            emergencies. The signs and symptoms of potential                            delayed complications were discussed with the                            patient. Return to normal activities tomorrow.                            Written discharge instructions were provided to the                            patient.                           - Resume previous diet.                            - Continue present medications.                           - Await pathology results. Shunsuke Granzow E. Tomasa Rand, MD 07/01/2022 2:47:48 PM This report has been signed electronically.

## 2022-07-01 NOTE — Progress Notes (Signed)
Called to room to assist during endoscopic procedure.  Patient ID and intended procedure confirmed with present staff. Received instructions for my participation in the procedure from the performing physician.  

## 2022-07-01 NOTE — Progress Notes (Signed)
Vss nad trans to pacu 

## 2022-07-01 NOTE — Op Note (Signed)
[facility] Patient Name: Whitney Medina Procedure Date: 07/01/2022 1:55 PM MRN: [id number] Endoscopist: Lorin Picket E. Tomasa Rand , MD, [id number] Age: 89 Referring MD:  Date of Birth: [birth year] Gender: Female Account #: [id number] Procedure:                Colonoscopy Indications:              Heme positive stool, Rectal bleeding Medicines:                Monitored Anesthesia Care Procedure:                Pre-Anesthesia Assessment:                           - Prior to the procedure, a History and Physical                            was performed, and patient medications and                            allergies were reviewed. The patient's tolerance of                            previous anesthesia was also reviewed. The risks                            and benefits of the procedure and the sedation                            options and risks were discussed with the patient.                            All questions were answered, and informed consent                            was obtained. Prior Anticoagulants: The patient has                            taken Eliquis (apixaban), last dose was 2 days                            prior to procedure. ASA Grade Assessment: III - A                            patient with severe systemic disease. After                            reviewing the risks and benefits, the patient was                            deemed in satisfactory condition to undergo the                            procedure.  After obtaining informed consent, the colonoscope                            was passed under direct vision. Throughout the                            procedure, the patient's blood pressure, pulse, and                            oxygen saturations were monitored continuously. The                            Olympus CF-HQ190L [id number]) Colonoscope was                            introduced through the anus and advanced to  the the                            terminal ileum, with identification of the                            appendiceal orifice and IC valve. The colonoscopy                            was performed without difficulty. The patient                            tolerated the procedure well. The quality of the                            bowel preparation was good. The terminal ileum,                            ileocecal valve, appendiceal orifice, and rectum                            were photographed. The bowel preparation used was                            SUPREP via split dose instruction. Scope In: 2:14:02 PM Scope Out: 2:40:27 PM Scope Withdrawal Time: 0 hours 18 minutes 36 seconds  Total Procedure Duration: 0 hours 26 minutes 25 seconds  Findings:                 The perianal and digital rectal examinations were                            normal. Pertinent negatives include normal                            sphincter tone and no palpable rectal lesions.                           Three sessile polyps were found in the ascending  colon. The polyps were 3 to 7 mm in size. These                            polyps were removed with a cold snare. Resection                            and retrieval were complete. Estimated blood loss                            was minimal.                           A 3 mm polyp was found in the ascending colon. The                            polyp was sessile. The polyp was removed with a                            cold biopsy forceps. Resection and retrieval were                            complete. Estimated blood loss was minimal.                           A 4 mm polyp was found in the transverse colon. The                            polyp was sessile. The polyp was removed with a                            cold snare. Resection and retrieval were complete.                            Estimated blood loss was minimal.                            A 3 mm polyp was found in the splenic flexure. The                            polyp was sessile. The polyp was removed with a                            cold snare. Resection and retrieval were complete.                            Estimated blood loss was minimal.                           A 3 mm polyp was found in the descending colon. The                            polyp was sessile. The polyp was removed with  a                            cold snare. Resection and retrieval were complete.                            Estimated blood loss was minimal.                           A 6 mm polyp was found in the rectum. The polyp was                            sessile. The polyp was removed with a cold snare.                            Resection and retrieval were complete. Estimated                            blood loss was minimal.                           Multiple small-mouthed diverticula were found in                            the sigmoid colon, descending colon and ascending                            colon.                           The retroflexed view of the distal rectum and anal                            verge was normal and showed no anal or rectal                            abnormalities. Complications:            No immediate complications. Estimated Blood Loss:     Estimated blood loss was minimal. Impression:               - Three 3 to 7 mm polyps in the ascending colon,                            removed with a cold snare. Resected and retrieved.                           - One 3 mm polyp in the ascending colon, removed                            with a cold biopsy forceps. Resected and retrieved.                           - One 4 mm polyp in the transverse colon, removed  with a cold snare. Resected and retrieved.                           - One 3 mm polyp at the splenic flexure, removed                            with a cold snare. Resected  and retrieved.                           - One 3 mm polyp in the descending colon, removed                            with a cold snare. Resected and retrieved.                           - One 6 mm polyp in the rectum, removed with a cold                            snare. Resected and retrieved.                           - Diverticulosis in the sigmoid colon, in the                            descending colon and in the ascending colon. Recommendation:           - Patient has a contact number available for                            emergencies. The signs and symptoms of potential                            delayed complications were discussed with the                            patient. Return to normal activities tomorrow.                            Written discharge instructions were provided to the                            patient.                           - Resume previous diet.                           - Resume Eliquis (apixaban) at prior dose in 2 days.                           - Await pathology results.                           - Unless advanced histology is noted on the  resected polyps, would recommend against further                            colon cancer screening/surveillance. Oluwasemilore Pascuzzi E. Tomasa Rand, MD 07/01/2022 3:12:00 PM This report has been signed electronically.

## 2022-07-01 NOTE — Progress Notes (Signed)
History and Physical Interval Note:  07/01/2022 1:55 PM  Whitney Medina  has presented today for endoscopic procedure(s), with the diagnosis of  Encounter Diagnosis  Name Primary?   Chronic anticoagulation Yes  .  The various methods of evaluation and treatment have been discussed with the patient and/or family. After consideration of risks, benefits and other options for treatment, the patient has consented to  the endoscopic procedure(s).   The patient's history has been reviewed, patient examined, no change in status, stable for endoscopic procedure(s).  I have reviewed the patient's chart and labs.  Questions were answered to the patient's satisfaction.     Dayanira Giovannetti E. Tomasa Rand, MD [facility]

## 2022-07-01 NOTE — Patient Instructions (Addendum)
Handout on hemorrhoids and polyps given to patient Await pathology results Resume previous diet and continue present medications - resume Eliquis (apixaban) in 2 days on 07/03/22 No repeat colonoscopy for surveillance, follow up with GI office as needed    YOU HAD AN ENDOSCOPIC PROCEDURE TODAY AT THE [facility]:   Refer to the procedure report that was given to you for any specific questions about what was found during the examination.  If the procedure report does not answer your questions, please call your gastroenterologist to clarify.  If you requested that your care partner not be given the details of your procedure findings, then the procedure report has been included in a sealed envelope for you to review at your convenience later.  YOU SHOULD EXPECT: Some feelings of bloating in the abdomen. Passage of more gas than usual.  Walking can help get rid of the air that was put into your GI tract during the procedure and reduce the bloating. If you had a lower endoscopy (such as a colonoscopy or flexible sigmoidoscopy) you may notice spotting of blood in your stool or on the toilet paper. If you underwent a bowel prep for your procedure, you may not have a normal bowel movement for a few days.  Please Note:  You might notice some irritation and congestion in your nose or some drainage.  This is from the oxygen used during your procedure.  There is no need for concern and it should clear up in a day or so.  SYMPTOMS TO REPORT IMMEDIATELY:  Following lower endoscopy (colonoscopy or flexible sigmoidoscopy):  Excessive amounts of blood in the stool  Significant tenderness or worsening of abdominal pains  Swelling of the abdomen that is new, acute  Fever of 100F or higher  Following upper endoscopy (EGD)  Vomiting of blood or coffee ground material  New chest pain or pain under the shoulder blades  Painful or persistently difficult swallowing  New shortness of breath  Fever of  100F or higher  Black, tarry-looking stools  For urgent or emergent issues, a gastroenterologist can be reached at any hour by calling [phone]. Do not use MyChart messaging for urgent concerns.    DIET:  We do recommend a small meal at first, but then you may proceed to your regular diet.  Drink plenty of fluids but you should avoid alcoholic beverages for 24 hours.  ACTIVITY:  You should plan to take it easy for the rest of today and you should NOT DRIVE or use heavy machinery until tomorrow (because of the sedation medicines used during the test).    FOLLOW UP: Our staff will call the number listed on your records the next business day following your procedure.  We will call around 7:15- 8:00 am to check on you and address any questions or concerns that you may have regarding the information given to you following your procedure. If we do not reach you, we will leave a message.     If any biopsies were taken you will be contacted by phone or by letter within the next 1-3 weeks.  Please call us at [phone] if you have not heard about the biopsies in 3 weeks.    SIGNATURES/CONFIDENTIALITY: You and/or your care partner have signed paperwork which will be entered into your electronic medical record.  These signatures attest to the fact that that the information above on your After Visit Summary has been reviewed and is understood.  Full responsibility of the  confidentiality of this discharge information lies with you and/or your care-partner. 

## 2022-07-02 ENCOUNTER — Telehealth: Payer: Self-pay | Admitting: *Deleted

## 2022-07-02 NOTE — Telephone Encounter (Signed)
  Follow up Call-     07/01/2022    1:13 PM  Call back number  Post procedure Call Back phone  # [phone]  Permission to leave phone message Yes     Patient questions:  Do you have a fever, pain , or abdominal swelling? No. Pain Score  0 *  Have you tolerated food without any problems? Yes.    Have you been able to return to your normal activities? Yes.    Do you have any questions about your discharge instructions: Diet   No. Medications  No. Follow up visit  No.  Do you have questions or concerns about your Care? No.  Actions: * If pain score is 4 or above: No action needed, pain <4.

## 2022-07-08 ENCOUNTER — Other Ambulatory Visit: Payer: Self-pay

## 2022-07-08 DIAGNOSIS — A048 Other specified bacterial intestinal infections: Secondary | ICD-10-CM

## 2022-07-08 MED ORDER — BISMUTH 262 MG PO CHEW: 524.0000 mg | CHEWABLE_TABLET | Freq: Four times a day (QID) | ORAL | 0 refills | Status: AC

## 2022-07-08 MED ORDER — DOXYCYCLINE HYCLATE 100 MG PO CAPS: 100.0000 mg | ORAL_CAPSULE | Freq: Two times a day (BID) | ORAL | 0 refills | Status: AC

## 2022-07-08 MED ORDER — METRONIDAZOLE 250 MG PO TABS: 250.0000 mg | ORAL_TABLET | Freq: Four times a day (QID) | ORAL | 0 refills | Status: AC

## 2022-07-08 MED ORDER — OMEPRAZOLE 20 MG PO CPDR: 20.0000 mg | DELAYED_RELEASE_CAPSULE | Freq: Two times a day (BID) | ORAL | 0 refills | Status: AC

## 2022-07-08 NOTE — Progress Notes (Signed)
Whitney Medina,  The biopsies from the recent upper GI Endoscopy were notable for H. Pylori gastritis.  This can cause gastritis, ulcers, pain/nausea and increase the risk of stomach cancer.  It is important to completely eradicate this bacteria.  Will plan on treating with quad therapy as below. Please confirm no medication allergies to the prescribed regimen.   1) Omeprazole 20 mg 2 times a day x 14 d 2) Pepto Bismol 2 tabs (262 mg each) 4 times a day x 14 d 3) Metronidazole 250 mg 4 times a day x 14 d 4) doxycycline 100 mg 2 times a day x 14 d  After 14 days, ok to stop omeprazole.  4 weeks after treatment completed, check H. Pylori stool antigen to confirm eradication (must be off acid suppression therapy for 2 weeks prior to specimen submission)   All eight of the polyps that I removed during your recent procedure were completely benign but were proven to be "pre-cancerous" polyps that MAY have grown into cancers if they had not been removed.  Studies shows that at least 20% of women over age 49 and 30% of men over age 79 have pre-cancerous polyps.     Based on your age and lack of advanced/high risk polyps, I would recommend against any further colon cancer screening.

## 2022-07-26 ENCOUNTER — Telehealth: Payer: Self-pay | Admitting: Gastroenterology

## 2022-07-26 NOTE — Telephone Encounter (Signed)
Pt wanted to know when she was to stop her omeprazole. Instructed her she was to continue it along with the antibiotics until the medication was completed. She verbalized understanding.

## 2022-07-26 NOTE — Telephone Encounter (Signed)
Patient is calling having questions regarding stopping her medications. Please advise

## 2022-08-19 ENCOUNTER — Telehealth: Payer: Self-pay | Admitting: *Deleted

## 2022-08-19 NOTE — Telephone Encounter (Signed)
Called patient to inform to pick up stool kit to retest for H. Pylori infection. Also informed the patient where to pick up the kit, at [address] at the [facility] building in the basement. Patient understood and agreed.

## 2022-08-19 NOTE — Telephone Encounter (Signed)
-----   Message from Nurse Kerrie Buffalo sent at 08/19/2022  8:12 AM EDT ----- Regarding: FW: Lab  ----- Message ----- From: Chrystie Nose, RN Sent: 08/19/2022  12:00 AM EDT To: Chrystie Nose, RN Subject: Lab                                            Pt needs h pylori stool test, order in epic.

## 2022-08-20 ENCOUNTER — Other Ambulatory Visit: Payer: Medicare HMO

## 2022-08-20 DIAGNOSIS — E785 Hyperlipidemia, unspecified: Secondary | ICD-10-CM | POA: Diagnosis not present

## 2022-08-20 DIAGNOSIS — I119 Hypertensive heart disease without heart failure: Secondary | ICD-10-CM | POA: Diagnosis not present

## 2022-08-20 DIAGNOSIS — E039 Hypothyroidism, unspecified: Secondary | ICD-10-CM | POA: Diagnosis not present

## 2022-08-20 DIAGNOSIS — M8580 Other specified disorders of bone density and structure, unspecified site: Secondary | ICD-10-CM | POA: Diagnosis not present

## 2022-09-01 DIAGNOSIS — I25119 Atherosclerotic heart disease of native coronary artery with unspecified angina pectoris: Secondary | ICD-10-CM | POA: Diagnosis not present

## 2022-09-01 DIAGNOSIS — I4891 Unspecified atrial fibrillation: Secondary | ICD-10-CM | POA: Diagnosis not present

## 2022-09-01 DIAGNOSIS — F39 Unspecified mood [affective] disorder: Secondary | ICD-10-CM | POA: Diagnosis not present

## 2022-09-01 DIAGNOSIS — R82998 Other abnormal findings in urine: Secondary | ICD-10-CM | POA: Diagnosis not present

## 2022-09-01 DIAGNOSIS — Z1339 Encounter for screening examination for other mental health and behavioral disorders: Secondary | ICD-10-CM | POA: Diagnosis not present

## 2022-09-01 DIAGNOSIS — E039 Hypothyroidism, unspecified: Secondary | ICD-10-CM | POA: Diagnosis not present

## 2022-09-01 DIAGNOSIS — I429 Cardiomyopathy, unspecified: Secondary | ICD-10-CM | POA: Diagnosis not present

## 2022-09-01 DIAGNOSIS — Z0000 Encounter for general adult medical examination without abnormal findings: Secondary | ICD-10-CM | POA: Diagnosis not present

## 2022-09-01 DIAGNOSIS — I10 Essential (primary) hypertension: Secondary | ICD-10-CM | POA: Diagnosis not present

## 2022-09-01 DIAGNOSIS — M8580 Other specified disorders of bone density and structure, unspecified site: Secondary | ICD-10-CM | POA: Diagnosis not present

## 2022-09-01 DIAGNOSIS — D692 Other nonthrombocytopenic purpura: Secondary | ICD-10-CM | POA: Diagnosis not present

## 2022-09-01 DIAGNOSIS — Z7901 Long term (current) use of anticoagulants: Secondary | ICD-10-CM | POA: Diagnosis not present

## 2022-09-01 DIAGNOSIS — Z1331 Encounter for screening for depression: Secondary | ICD-10-CM | POA: Diagnosis not present

## 2022-09-01 DIAGNOSIS — R059 Cough, unspecified: Secondary | ICD-10-CM | POA: Diagnosis not present

## 2022-09-06 ENCOUNTER — Other Ambulatory Visit: Payer: Self-pay

## 2022-09-06 ENCOUNTER — Other Ambulatory Visit: Payer: Medicare HMO

## 2022-09-06 ENCOUNTER — Ambulatory Visit: Payer: Medicare HMO

## 2022-09-06 DIAGNOSIS — A048 Other specified bacterial intestinal infections: Secondary | ICD-10-CM | POA: Diagnosis not present

## 2022-09-09 LAB — H. PYLORI ANTIGEN, STOOL: H pylori Ag, Stl: NEGATIVE

## 2022-09-09 NOTE — Progress Notes (Signed)
Ms. Yoshinaga, Your H. pylori stool antigen was negative, indicating eradication of the bacteria from her stomach.  No further testing or treatment is necessary at this time.

## 2022-09-13 ENCOUNTER — Other Ambulatory Visit: Payer: Self-pay | Admitting: Medical Genetics

## 2022-09-13 DIAGNOSIS — Z006 Encounter for examination for normal comparison and control in clinical research program: Secondary | ICD-10-CM

## 2022-11-26 NOTE — Progress Notes (Addendum)
HPI: FU atrial fibrillation. Admitted December 2015 with a syncopal episode. She was found to be in AFib with RVR.  EF was noted to be 40-45% (possibly related to tachycardia).  It was questioned whether or not her syncope may be related to post-termination pauses. Long term anticoagulation with Xarelto was recommended. She subsequently underwent a 30 day event monitor which showed no arrhythmias. Lexiscan nuclear study 1/16 showed no ischemia and her ejection fraction had improved to 70%. CTA April 2022 showed small dissection flap in the left proximal subclavian artery and she was asked to follow-up with vascular surgery; medical therapy recommended. Monitor September 2023 showed sinus rhythm with episodes of SVT longest 13 seconds.  Echocardiogram May 2024 showed normal LV function, mild left atrial enlargement, mild to moderate mitral regurgitation, mild aortic insufficiency and moderate plaque in the aortic arch.  Since last seen, she denies increased dyspnea, chest pain, palpitations or syncope.  She has had some problems with unsteadiness and dizziness with changing positions.  Current Outpatient Medications  Medication Sig Dispense Refill   acetaminophen (TYLENOL) 500 MG tablet Take 1,000 mg by mouth every 6 (six) hours as needed for moderate pain or fever (fever).      amiodarone (PACERONE) 200 MG tablet Take 1 tablet (200 mg total) by mouth daily. 90 tablet 3   atorvastatin (LIPITOR) 20 MG tablet Take 20 mg by mouth every evening.     B Complex Vitamins (B COMPLEX-B12 PO) Take 1 tablet by mouth daily.     Bismuth 262 MG CHEW Chew 524 mg by mouth in the morning, at noon, in the evening, and at bedtime. 112 tablet 0   Calcium Citrate-Vitamin D (CALCIUM + D PO) Take 600 mg by mouth daily.     Carboxymeth-Glyc-Polysorb PF (REFRESH OPTIVE MEGA-3) 0.5-1-0.5 % SOLN one drop in both eyes twice a day     cholecalciferol (VITAMIN D) 1000 units tablet Take 1 capsule by mouth daily.     doxycycline  (VIBRAMYCIN) 100 MG capsule Take 1 capsule (100 mg total) by mouth 2 (two) times daily. (Patient not taking: Reported on 12/09/2022) 28 capsule 0   levothyroxine (SYNTHROID) 88 MCG tablet Take 88 mcg by mouth daily before breakfast.     metroNIDAZOLE (FLAGYL) 250 MG tablet Take 1 tablet (250 mg total) by mouth 4 (four) times daily. (Patient not taking: Reported on 12/09/2022) 56 tablet 0   omega-3 acid ethyl esters (LOVAZA) 1 g capsule Take 1 g by mouth daily.     omeprazole (PRILOSEC) 20 MG capsule Take 1 capsule (20 mg total) by mouth 2 (two) times daily before a meal. (Patient not taking: Reported on 12/09/2022) 28 capsule 0   Rivaroxaban (XARELTO) 15 MG TABS tablet TAKE 1 TABLET (15 MG TOTAL) BY MOUTH DAILY WITH SUPPER 90 tablet 2   traZODone (DESYREL) 50 MG tablet Take 50 mg by mouth at bedtime.     vitamin C (ASCORBIC ACID) 500 MG tablet Take 500 mg by mouth daily.      diltiazem (CARDIZEM) 30 MG tablet TAKE 1 TABLET TWICE DAILY 180 tablet 3   No current facility-administered medications for this visit.     Past Medical History:  Diagnosis Date   Ascending aortic aneurysm (HCC)    3.8 x 4 cm on CTA 12/2013   CAD (coronary artery disease)    a. Chest CTA 01/16/14:  IMPRESSION: 1. Negative for pulmonary embolus or acute vascular abnormality. 2. Atherosclerosis and coronary artery disease. 3. Unchanged  ascending aortic ectasia measuring 38 mm x 40 mm.  Recommend annual imaging followup by CTA or MRA   Cardiomyopathy [facility])    a.  Echocardiogram 12/3013:  Left ventricle: Systolic function was mildly to moderately reduced. The estimated ejection fraction was in the range of 40% to 45%. Regional wall motion abnormalities cannot be excluded.  The study is not technically sufficient to allow evaluation of LV diastolic function.   Diverticulosis    DVT (deep venous thrombosis) (HCC)    History of anemia    Hx of cardiovascular stress test    Lexiscan Myoview (01/2014):  No ischemia or scar, EF  70%; Normal Study/Low Risk   Hypercholesteremia    Hypothyroidism    IBS (irritable bowel syndrome)    Left knee DJD    PAF (paroxysmal atrial fibrillation) (HCC)    PE (pulmonary embolism)    Pulmonary embolism (HCC)    Raynaud disease    Shingles     Past Surgical History:  Procedure Laterality Date   ABDOMINAL HYSTERECTOMY  01/19/1971   APPENDECTOMY     KNEE ARTHROSCOPY Left    SHOULDER ADHESION RELEASE Left 01/18/1998   TONSILLECTOMY  01/19/1959   TOTAL KNEE ARTHROPLASTY Left 12/25/2012   Procedure: TOTAL KNEE ARTHROPLASTY- left;  Surgeon: Nilda Simmer, MD;  Location: [facility] OR;  Service: Orthopedics;  Laterality: Left;    Social History   Socioeconomic History   Marital status: Widowed    Spouse name: Not on file   Number of children: 5   Years of education: Not on file   Highest education level: Not on file  Occupational History   Occupation: retried  Tobacco Use   Smoking status: Never   Smokeless tobacco: Never   Tobacco comments:    Never smoke 09/03/21  Vaping Use   Vaping status: Never Used  Substance and Sexual Activity   Alcohol use: No    Comment: Rare glass of wine   Drug use: No   Sexual activity: Not on file  Other Topics Concern   Not on file  Social History Narrative   Not on file   Social Determinants of Health   Financial Resource Strain: Not on file  Food Insecurity: Not on file  Transportation Needs: Not on file  Physical Activity: Not on file  Stress: Not on file  Social Connections: Not on file  Intimate Partner Violence: Not on file    Family History  Problem Relation Age of Onset   Liver cancer Mother    Colon polyps Father    Colon cancer Sister    Pancreatic cancer Sister    Heart disease Sister 85       CABG   Diverticulitis Sister    Hyperlipidemia Daughter    Other Daughter        prediabetes   Diabetes Son    Heart failure Son    Alcoholism Son    Stroke Neg Hx    Hypertension Neg Hx     ROS: no fevers or  chills, productive cough, hemoptysis, dysphasia, odynophagia, melena, hematochezia, dysuria, hematuria, rash, seizure activity, orthopnea, PND, pedal edema, claudication. Remaining systems are negative.  Physical Exam: Well-developed well-nourished in no acute distress.  Skin is warm and dry.  HEENT is normal.  Neck is supple.  Chest is clear to auscultation with normal expansion.  Cardiovascular exam is regular rate and rhythm.  Abdominal exam nontender or distended. No masses palpated. Extremities show no edema. neuro grossly intact  Electrocardiogram today personally reviewed and shows normal sinus rhythm with PACs and nonspecific ST changes.  A/P  1 paroxysmal atrial fibrillation-patient remains in sinus rhythm.  Continue amiodarone and Xarelto.  She is having some difficulties with dizziness with changing positions.  Will discontinue Cardizem to allow her blood pressure to run higher.  Will have most recent blood work forwarded to [place] from primary care.  2 coronary artery disease-based on previous CT showing coronary calcification.  Continue statin.  3 history of thoracic aortic aneurysm/dissection and subclavian-patient seen by vascular surgery and medical therapy recommended.  4 history of tachycardia mediated cardiomyopathy-LV function has improved on most recent echocardiogram.  5 hyperlipidemia-continue statin.  6 prior pulmonary embolus-patient is on chronic anticoagulation with Xarelto for history of atrial fibrillation.  Olga Millers, MD

## 2022-12-09 ENCOUNTER — Encounter: Payer: Self-pay | Admitting: Cardiology

## 2022-12-09 ENCOUNTER — Ambulatory Visit: Payer: Medicare HMO | Attending: Cardiology | Admitting: Cardiology

## 2022-12-09 VITALS — BP 122/72 | HR 73 | Ht 67.0 in | Wt 147.7 lb

## 2022-12-09 DIAGNOSIS — I10 Essential (primary) hypertension: Secondary | ICD-10-CM

## 2022-12-09 DIAGNOSIS — I480 Paroxysmal atrial fibrillation: Secondary | ICD-10-CM | POA: Diagnosis not present

## 2022-12-09 DIAGNOSIS — E785 Hyperlipidemia, unspecified: Secondary | ICD-10-CM

## 2022-12-09 DIAGNOSIS — I2510 Atherosclerotic heart disease of native coronary artery without angina pectoris: Secondary | ICD-10-CM

## 2022-12-09 NOTE — Patient Instructions (Signed)
Medication Instructions:   STOP DILTIAZEM  *If you need a refill on your cardiac medications before your next appointment, please call your pharmacy*   Follow-Up: At [facility], you and your health needs are our priority.  As part of our continuing mission to provide you with exceptional heart care, we have created designated Provider Care Teams.  These Care Teams include your primary Cardiologist (physician) and Advanced Practice Providers (APPs -  Physician Assistants and Nurse Practitioners) who all work together to provide you with the care you need, when you need it.    Your next appointment:   6 month(s)  Provider:   Olga Millers, MD

## 2023-02-16 DIAGNOSIS — E039 Hypothyroidism, unspecified: Secondary | ICD-10-CM | POA: Diagnosis not present

## 2023-02-16 DIAGNOSIS — R059 Cough, unspecified: Secondary | ICD-10-CM | POA: Diagnosis not present

## 2023-02-16 DIAGNOSIS — I2699 Other pulmonary embolism without acute cor pulmonale: Secondary | ICD-10-CM | POA: Diagnosis not present

## 2023-02-16 DIAGNOSIS — I429 Cardiomyopathy, unspecified: Secondary | ICD-10-CM | POA: Diagnosis not present

## 2023-02-16 DIAGNOSIS — E441 Mild protein-calorie malnutrition: Secondary | ICD-10-CM | POA: Diagnosis not present

## 2023-02-16 DIAGNOSIS — I10 Essential (primary) hypertension: Secondary | ICD-10-CM | POA: Diagnosis not present

## 2023-02-16 DIAGNOSIS — R531 Weakness: Secondary | ICD-10-CM | POA: Diagnosis not present

## 2023-02-16 DIAGNOSIS — I4891 Unspecified atrial fibrillation: Secondary | ICD-10-CM | POA: Diagnosis not present

## 2023-02-24 DIAGNOSIS — M6281 Muscle weakness (generalized): Secondary | ICD-10-CM | POA: Diagnosis not present

## 2023-02-28 DIAGNOSIS — M6281 Muscle weakness (generalized): Secondary | ICD-10-CM | POA: Diagnosis not present

## 2023-03-04 DIAGNOSIS — M6281 Muscle weakness (generalized): Secondary | ICD-10-CM | POA: Diagnosis not present

## 2023-03-07 DIAGNOSIS — M6281 Muscle weakness (generalized): Secondary | ICD-10-CM | POA: Diagnosis not present

## 2023-03-14 DIAGNOSIS — M6281 Muscle weakness (generalized): Secondary | ICD-10-CM | POA: Diagnosis not present

## 2023-04-07 DIAGNOSIS — L111 Transient acantholytic dermatosis [Grover]: Secondary | ICD-10-CM | POA: Diagnosis not present

## 2023-04-07 DIAGNOSIS — L2989 Other pruritus: Secondary | ICD-10-CM | POA: Diagnosis not present

## 2023-04-07 DIAGNOSIS — L821 Other seborrheic keratosis: Secondary | ICD-10-CM | POA: Diagnosis not present

## 2023-04-07 DIAGNOSIS — D485 Neoplasm of uncertain behavior of skin: Secondary | ICD-10-CM | POA: Diagnosis not present

## 2023-04-18 ENCOUNTER — Other Ambulatory Visit (HOSPITAL_BASED_OUTPATIENT_CLINIC_OR_DEPARTMENT_OTHER): Payer: Self-pay | Admitting: Family

## 2023-05-10 ENCOUNTER — Other Ambulatory Visit (HOSPITAL_COMMUNITY): Payer: Self-pay | Admitting: Physician Assistant

## 2023-05-10 NOTE — Telephone Encounter (Signed)
 Prescription refill request for Xarelto  received.  Indication:afib Last office visit:11/24 Weight:67  kg Age:89 Scr:0.90  4/24 CrCl:44.82  ml/min  Prescription refilled

## 2023-05-31 ENCOUNTER — Other Ambulatory Visit (HOSPITAL_COMMUNITY)

## 2023-06-07 DIAGNOSIS — M1990 Unspecified osteoarthritis, unspecified site: Secondary | ICD-10-CM | POA: Diagnosis not present

## 2023-06-07 DIAGNOSIS — R1310 Dysphagia, unspecified: Secondary | ICD-10-CM | POA: Diagnosis not present

## 2023-06-07 DIAGNOSIS — I429 Cardiomyopathy, unspecified: Secondary | ICD-10-CM | POA: Diagnosis not present

## 2023-06-07 DIAGNOSIS — K219 Gastro-esophageal reflux disease without esophagitis: Secondary | ICD-10-CM | POA: Diagnosis not present

## 2023-06-07 DIAGNOSIS — I10 Essential (primary) hypertension: Secondary | ICD-10-CM | POA: Diagnosis not present

## 2023-06-07 DIAGNOSIS — E441 Mild protein-calorie malnutrition: Secondary | ICD-10-CM | POA: Diagnosis not present

## 2023-06-07 DIAGNOSIS — R059 Cough, unspecified: Secondary | ICD-10-CM | POA: Diagnosis not present

## 2023-06-07 DIAGNOSIS — I4891 Unspecified atrial fibrillation: Secondary | ICD-10-CM | POA: Diagnosis not present

## 2023-06-20 DIAGNOSIS — D0339 Melanoma in situ of other parts of face: Secondary | ICD-10-CM | POA: Diagnosis not present

## 2023-06-20 DIAGNOSIS — L578 Other skin changes due to chronic exposure to nonionizing radiation: Secondary | ICD-10-CM | POA: Diagnosis not present

## 2023-06-20 DIAGNOSIS — L814 Other melanin hyperpigmentation: Secondary | ICD-10-CM | POA: Diagnosis not present

## 2023-07-06 ENCOUNTER — Other Ambulatory Visit: Payer: Self-pay | Admitting: Internal Medicine

## 2023-07-06 DIAGNOSIS — Z1231 Encounter for screening mammogram for malignant neoplasm of breast: Secondary | ICD-10-CM

## 2023-07-11 ENCOUNTER — Encounter

## 2023-07-11 DIAGNOSIS — Z1231 Encounter for screening mammogram for malignant neoplasm of breast: Secondary | ICD-10-CM

## 2023-07-15 ENCOUNTER — Encounter (HOSPITAL_BASED_OUTPATIENT_CLINIC_OR_DEPARTMENT_OTHER): Admitting: Radiology

## 2023-07-15 DIAGNOSIS — Z1231 Encounter for screening mammogram for malignant neoplasm of breast: Secondary | ICD-10-CM

## 2023-07-18 ENCOUNTER — Encounter (HOSPITAL_BASED_OUTPATIENT_CLINIC_OR_DEPARTMENT_OTHER): Admitting: Radiology

## 2023-07-18 DIAGNOSIS — Z1231 Encounter for screening mammogram for malignant neoplasm of breast: Secondary | ICD-10-CM

## 2023-07-21 NOTE — Progress Notes (Deleted)
 HPI: FU atrial fibrillation. Admitted December 2015 with a syncopal episode. She was found to be in AFib with RVR.  EF was noted to be 40-45% (possibly related to tachycardia).  It was questioned whether or not her syncope may be related to post-termination pauses. Long term anticoagulation with Xarelto  was recommended. She subsequently underwent a 30 day event monitor which showed no arrhythmias. Lexiscan  nuclear study 1/16 showed no ischemia and her ejection fraction had improved to 70%. CTA April 2022 showed small dissection flap in the left proximal subclavian artery and she was asked to follow-up with vascular surgery; medical therapy recommended. Monitor September 2023 showed sinus rhythm with episodes of SVT longest 13 seconds.  Echocardiogram May 2024 showed normal LV function, mild left atrial enlargement, mild to moderate mitral regurgitation, mild aortic insufficiency and moderate plaque in the aortic arch.  Since last seen,   Current Outpatient Medications  Medication Sig Dispense Refill   doxycycline  (VIBRAMYCIN ) 100 MG capsule Take 1 capsule (100 mg total) by mouth 2 (two) times daily. (Patient not taking: Reported on 12/09/2022) 28 capsule 0   metroNIDAZOLE  (FLAGYL ) 250 MG tablet Take 1 tablet (250 mg total) by mouth 4 (four) times daily. (Patient not taking: Reported on 12/09/2022) 56 tablet 0   omeprazole  (PRILOSEC) 20 MG capsule Take 1 capsule (20 mg total) by mouth 2 (two) times daily before a meal. (Patient not taking: Reported on 12/09/2022) 28 capsule 0   acetaminophen  (TYLENOL ) 500 MG tablet Take 1,000 mg by mouth every 6 (six) hours as needed for moderate pain or fever (fever).      amiodarone  (PACERONE ) 200 MG tablet TAKE 1 TABLET EVERY DAY 90 tablet 3   atorvastatin  (LIPITOR) 20 MG tablet Take 20 mg by mouth every evening.     B Complex Vitamins (B COMPLEX-B12 PO) Take 1 tablet by mouth daily.     Bismuth  262 MG CHEW Chew 524 mg by mouth in the morning, at noon, in the  evening, and at bedtime. 112 tablet 0   Calcium  Citrate-Vitamin D  (CALCIUM  + D PO) Take 600 mg by mouth daily.     Carboxymeth-Glyc-Polysorb PF (REFRESH OPTIVE MEGA-3) 0.5-1-0.5 % SOLN one drop in both eyes twice a day     cholecalciferol  (VITAMIN D ) 1000 units tablet Take 1 capsule by mouth daily.     levothyroxine  (SYNTHROID ) 88 MCG tablet Take 88 mcg by mouth daily before breakfast.     omega-3 acid ethyl esters (LOVAZA) 1 g capsule Take 1 g by mouth daily.     traZODone  (DESYREL ) 50 MG tablet Take 50 mg by mouth at bedtime.     vitamin C  (ASCORBIC ACID ) 500 MG tablet Take 500 mg by mouth daily.      XARELTO  15 MG TABS tablet TAKE 1 TABLET EVERY DAY WITH SUPPER 90 tablet 3   No current facility-administered medications for this visit.     Past Medical History:  Diagnosis Date   Ascending aortic aneurysm (HCC)    3.8 x 4 cm on CTA 12/2013   CAD (coronary artery disease)    a. Chest CTA 01/16/14:  IMPRESSION: 1. Negative for pulmonary embolus or acute vascular abnormality. 2. Atherosclerosis and coronary artery disease. 3. Unchanged ascending aortic ectasia measuring 38 mm x 40 mm.  Recommend annual imaging followup by CTA or MRA   Cardiomyopathy [facility])    a.  Echocardiogram 12/3013:  Left ventricle: Systolic function was mildly to moderately reduced. The estimated ejection fraction was in  the range of 40% to 45%. Regional wall motion abnormalities cannot be excluded.  The study is not technically sufficient to allow evaluation of LV diastolic function.   Diverticulosis    DVT (deep venous thrombosis) (HCC)    History of anemia    Hx of cardiovascular stress test    Lexiscan  Myoview (01/2014):  No ischemia or scar, EF 70%; Normal Study/Low Risk   Hypercholesteremia    Hypothyroidism    IBS (irritable bowel syndrome)    Left knee DJD    PAF (paroxysmal atrial fibrillation) (HCC)    PE (pulmonary embolism)    Pulmonary embolism (HCC)    Raynaud disease    Shingles     Past Surgical  History:  Procedure Laterality Date   ABDOMINAL HYSTERECTOMY  01/19/1971   APPENDECTOMY     KNEE ARTHROSCOPY Left    SHOULDER ADHESION RELEASE Left 01/18/1998   TONSILLECTOMY  01/19/1959   TOTAL KNEE ARTHROPLASTY Left 12/25/2012   Procedure: TOTAL KNEE ARTHROPLASTY- left;  Surgeon: Lamar DELENA Millman, MD;  Location: [facility] OR;  Service: Orthopedics;  Laterality: Left;    Social History   Socioeconomic History   Marital status: Widowed    Spouse name: Not on file   Number of children: 5   Years of education: Not on file   Highest education level: Not on file  Occupational History   Occupation: retried  Tobacco Use   Smoking status: Never   Smokeless tobacco: Never   Tobacco comments:    Never smoke 09/03/21  Vaping Use   Vaping status: Never Used  Substance and Sexual Activity   Alcohol  use: No    Comment: Rare glass of wine   Drug use: No   Sexual activity: Not on file  Other Topics Concern   Not on file  Social History Narrative   Not on file   Social Drivers of Health   Financial Resource Strain: Not on file  Food Insecurity: Not on file  Transportation Needs: Not on file  Physical Activity: Not on file  Stress: Not on file  Social Connections: Not on file  Intimate Partner Violence: Not on file    Family History  Problem Relation Age of Onset   Liver cancer Mother    Colon polyps Father    Colon cancer Sister    Pancreatic cancer Sister    Heart disease Sister 32       CABG   Diverticulitis Sister    Hyperlipidemia Daughter    Other Daughter        prediabetes   Diabetes Son    Heart failure Son    Alcoholism Son    Stroke Neg Hx    Hypertension Neg Hx     ROS: no fevers or chills, productive cough, hemoptysis, dysphasia, odynophagia, melena, hematochezia, dysuria, hematuria, rash, seizure activity, orthopnea, PND, pedal edema, claudication. Remaining systems are negative.  Physical Exam: Well-developed well-nourished in no acute distress.  Skin is  warm and dry.  HEENT is normal.  Neck is supple.  Chest is clear to auscultation with normal expansion.  Cardiovascular exam is regular rate and rhythm.  Abdominal exam nontender or distended. No masses palpated. Extremities show no edema. neuro grossly intact  ECG- personally reviewed  A/P  1 paroxysmal atrial fibrillation-patient remains in sinus rhythm.  Continue amiodarone  and Xarelto .  Check TSH, liver functions, chest x-ray, hemoglobin and renal function.  2 history of thoracic aortic aneurysm/dissection-patient previously seen by vascular surgery and medical therapy recommended.  3 hyperlipidemia-continue statin.  4 coronary calcification-noted on previous CT.  Continue statin.  She denies chest pain.  5 history of pulmonary embolus-patient remains on anticoagulation for history of atrial fibrillation.  6 history of tachycardia mediated cardiomyopathy-LV function improved on most recent echocardiogram.  Redell Shallow, MD

## 2023-07-28 ENCOUNTER — Ambulatory Visit: Admitting: Cardiology

## 2023-08-05 ENCOUNTER — Other Ambulatory Visit (HOSPITAL_BASED_OUTPATIENT_CLINIC_OR_DEPARTMENT_OTHER): Payer: Self-pay | Admitting: Internal Medicine

## 2023-08-05 DIAGNOSIS — Z1231 Encounter for screening mammogram for malignant neoplasm of breast: Secondary | ICD-10-CM

## 2023-08-12 ENCOUNTER — Ambulatory Visit (HOSPITAL_BASED_OUTPATIENT_CLINIC_OR_DEPARTMENT_OTHER)
Admission: RE | Admit: 2023-08-12 | Discharge: 2023-08-12 | Disposition: A | Discharge status: Home / Self Care | Source: Ambulatory Visit | Attending: Internal Medicine | Admitting: Internal Medicine

## 2023-08-12 ENCOUNTER — Encounter (HOSPITAL_BASED_OUTPATIENT_CLINIC_OR_DEPARTMENT_OTHER): Payer: Self-pay | Admitting: Radiology

## 2023-08-12 DIAGNOSIS — Z1231 Encounter for screening mammogram for malignant neoplasm of breast: Secondary | ICD-10-CM | POA: Insufficient documentation

## 2023-09-14 DIAGNOSIS — E785 Hyperlipidemia, unspecified: Secondary | ICD-10-CM | POA: Diagnosis not present

## 2023-09-14 DIAGNOSIS — E441 Mild protein-calorie malnutrition: Secondary | ICD-10-CM | POA: Diagnosis not present

## 2023-09-14 DIAGNOSIS — E559 Vitamin D deficiency, unspecified: Secondary | ICD-10-CM | POA: Diagnosis not present

## 2023-09-14 DIAGNOSIS — E039 Hypothyroidism, unspecified: Secondary | ICD-10-CM | POA: Diagnosis not present

## 2023-09-14 DIAGNOSIS — I10 Essential (primary) hypertension: Secondary | ICD-10-CM | POA: Diagnosis not present

## 2023-09-22 DIAGNOSIS — I2699 Other pulmonary embolism without acute cor pulmonale: Secondary | ICD-10-CM | POA: Diagnosis not present

## 2023-09-22 DIAGNOSIS — I10 Essential (primary) hypertension: Secondary | ICD-10-CM | POA: Diagnosis not present

## 2023-09-22 DIAGNOSIS — R8290 Unspecified abnormal findings in urine: Secondary | ICD-10-CM | POA: Diagnosis not present

## 2023-09-22 DIAGNOSIS — E039 Hypothyroidism, unspecified: Secondary | ICD-10-CM | POA: Diagnosis not present

## 2023-09-22 DIAGNOSIS — Z0000 Encounter for general adult medical examination without abnormal findings: Secondary | ICD-10-CM | POA: Diagnosis not present

## 2023-09-22 DIAGNOSIS — E441 Mild protein-calorie malnutrition: Secondary | ICD-10-CM | POA: Diagnosis not present

## 2023-09-22 DIAGNOSIS — Z1331 Encounter for screening for depression: Secondary | ICD-10-CM | POA: Diagnosis not present

## 2023-09-22 DIAGNOSIS — I429 Cardiomyopathy, unspecified: Secondary | ICD-10-CM | POA: Diagnosis not present

## 2023-09-22 DIAGNOSIS — I4891 Unspecified atrial fibrillation: Secondary | ICD-10-CM | POA: Diagnosis not present

## 2023-09-22 DIAGNOSIS — Z1339 Encounter for screening examination for other mental health and behavioral disorders: Secondary | ICD-10-CM | POA: Diagnosis not present

## 2023-09-22 DIAGNOSIS — Z7901 Long term (current) use of anticoagulants: Secondary | ICD-10-CM | POA: Diagnosis not present

## 2023-09-22 DIAGNOSIS — K219 Gastro-esophageal reflux disease without esophagitis: Secondary | ICD-10-CM | POA: Diagnosis not present

## 2023-09-22 DIAGNOSIS — D0330 Melanoma in situ of unspecified part of face: Secondary | ICD-10-CM | POA: Diagnosis not present

## 2023-10-17 NOTE — Progress Notes (Unsigned)
 HPI: FU atrial fibrillation. Admitted December 2015 with a syncopal episode. She was found to be in AFib with RVR.  EF was noted to be 40-45% (possibly related to tachycardia).  It was questioned whether or not her syncope may be related to post-termination pauses. Long term anticoagulation with Xarelto  was recommended. She subsequently underwent a 30 day event monitor which showed no arrhythmias. Lexiscan  nuclear study 1/16 showed no ischemia and her ejection fraction had improved to 70%. CTA April 2022 showed small dissection flap in the left proximal subclavian artery and she was asked to follow-up with vascular surgery; medical therapy recommended. Monitor September 2023 showed sinus rhythm with episodes of SVT longest 13 seconds.  Echocardiogram May 2024 showed normal LV function, mild left atrial enlargement, mild to moderate mitral regurgitation, mild aortic insufficiency and moderate plaque in the aortic arch.  Since last seen, patient denies dyspnea, chest pain, palpitations, syncope or bleeding.  Current Outpatient Medications  Medication Sig Dispense Refill   acetaminophen  (TYLENOL ) 500 MG tablet Take 1,000 mg by mouth every 6 (six) hours as needed for moderate pain or fever (fever).      amiodarone  (PACERONE ) 200 MG tablet TAKE 1 TABLET EVERY DAY 90 tablet 3   atorvastatin  (LIPITOR) 20 MG tablet Take 20 mg by mouth every evening.     B Complex Vitamins (B COMPLEX-B12 PO) Take 1 tablet by mouth daily.     Calcium  Citrate-Vitamin D  (CALCIUM  + D PO) Take 600 mg by mouth daily.     Carboxymeth-Glyc-Polysorb PF (REFRESH OPTIVE MEGA-3) 0.5-1-0.5 % SOLN one drop in both eyes twice a day     cholecalciferol  (VITAMIN D ) 1000 units tablet Take 1 capsule by mouth daily.     doxycycline  (VIBRAMYCIN ) 100 MG capsule Take 1 capsule (100 mg total) by mouth 2 (two) times daily. (Patient not taking: Reported on 12/09/2022) 28 capsule 0   levothyroxine  (SYNTHROID ) 88 MCG tablet Take 88 mcg by mouth  daily before breakfast.     metroNIDAZOLE  (FLAGYL ) 250 MG tablet Take 1 tablet (250 mg total) by mouth 4 (four) times daily. (Patient not taking: Reported on 12/09/2022) 56 tablet 0   omega-3 acid ethyl esters (LOVAZA) 1 g capsule Take 1 g by mouth daily.     omeprazole  (PRILOSEC) 20 MG capsule Take 1 capsule (20 mg total) by mouth 2 (two) times daily before a meal. (Patient not taking: Reported on 12/09/2022) 28 capsule 0   pantoprazole  (PROTONIX ) 40 MG tablet Take 40 mg by mouth 2 (two) times daily.     traZODone  (DESYREL ) 50 MG tablet Take 50 mg by mouth at bedtime.     vitamin C  (ASCORBIC ACID ) 500 MG tablet Take 500 mg by mouth daily.      XARELTO  15 MG TABS tablet TAKE 1 TABLET EVERY DAY WITH SUPPER 90 tablet 3   Bismuth  262 MG CHEW Chew 524 mg by mouth in the morning, at noon, in the evening, and at bedtime. 112 tablet 0   No current facility-administered medications for this visit.     Past Medical History:  Diagnosis Date   Ascending aortic aneurysm    3.8 x 4 cm on CTA 12/2013   CAD (coronary artery disease)    a. Chest CTA 01/16/14:  IMPRESSION: 1. Negative for pulmonary embolus or acute vascular abnormality. 2. Atherosclerosis and coronary artery disease. 3. Unchanged ascending aortic ectasia measuring 38 mm x 40 mm.  Recommend annual imaging followup by CTA or MRA  Cardiomyopathy [facility])    a.  Echocardiogram 12/3013:  Left ventricle: Systolic function was mildly to moderately reduced. The estimated ejection fraction was in the range of 40% to 45%. Regional wall motion abnormalities cannot be excluded.  The study is not technically sufficient to allow evaluation of LV diastolic function.   Diverticulosis    DVT (deep venous thrombosis) (HCC)    History of anemia    Hx of cardiovascular stress test    Lexiscan  Myoview (01/2014):  No ischemia or scar, EF 70%; Normal Study/Low Risk   Hypercholesteremia    Hypothyroidism    IBS (irritable bowel syndrome)    Left knee DJD    PAF  (paroxysmal atrial fibrillation) (HCC)    PE (pulmonary embolism)    Pulmonary embolism (HCC)    Raynaud disease    Shingles     Past Surgical History:  Procedure Laterality Date   ABDOMINAL HYSTERECTOMY  01/19/1971   APPENDECTOMY     KNEE ARTHROSCOPY Left    SHOULDER ADHESION RELEASE Left 01/18/1998   TONSILLECTOMY  01/19/1959   TOTAL KNEE ARTHROPLASTY Left 12/25/2012   Procedure: TOTAL KNEE ARTHROPLASTY- left;  Surgeon: Lamar DELENA Millman, MD;  Location: [facility] OR;  Service: Orthopedics;  Laterality: Left;    Social History   Socioeconomic History   Marital status: Widowed    Spouse name: Not on file   Number of children: 5   Years of education: Not on file   Highest education level: Not on file  Occupational History   Occupation: retried  Tobacco Use   Smoking status: Never   Smokeless tobacco: Never   Tobacco comments:    Never smoke 09/03/21  Vaping Use   Vaping status: Never Used  Substance and Sexual Activity   Alcohol  use: No    Comment: Rare glass of wine   Drug use: No   Sexual activity: Not on file  Other Topics Concern   Not on file  Social History Narrative   Not on file   Social Drivers of Health   Financial Resource Strain: Not on file  Food Insecurity: Not on file  Transportation Needs: Not on file  Physical Activity: Not on file  Stress: Not on file  Social Connections: Not on file  Intimate Partner Violence: Not on file    Family History  Problem Relation Age of Onset   Liver cancer Mother    Colon polyps Father    Colon cancer Sister    Pancreatic cancer Sister    Heart disease Sister 31       CABG   Diverticulitis Sister    Hyperlipidemia Daughter    Other Daughter        prediabetes   Diabetes Son    Heart failure Son    Alcoholism Son    Stroke Neg Hx    Hypertension Neg Hx     ROS: no fevers or chills, productive cough, hemoptysis, dysphasia, odynophagia, melena, hematochezia, dysuria, hematuria, rash, seizure activity,  orthopnea, PND, pedal edema, claudication. Remaining systems are negative.  Physical Exam: Well-developed well-nourished in no acute distress.  Skin is warm and dry.  HEENT is normal.  Neck is supple.  Chest is clear to auscultation with normal expansion.  Cardiovascular exam is regular rate and rhythm.  Abdominal exam nontender or distended. No masses palpated. Extremities show no edema. neuro grossly intact  EKG Interpretation Date/Time:  Thursday October 20 2023 10:14:25 EDT Ventricular Rate:  67 PR Interval:  172 QRS Duration:  84 QT Interval:  338 QTC Calculation: 357 R Axis:   -3  Text Interpretation: Normal sinus rhythm Nonspecific T wave abnormality Confirmed by Pietro Rogue ([id number]) on 10/20/2023 10:17:12 AM    A/P  1 paroxysmal atrial fibrillation-patient remains in sinus rhythm today.  Continue present dose of amiodarone  and Xarelto .  Check PA and lateral chest x-ray.  TSH, liver functions, hemoglobin and renal function monitored by primary care.  Will have most recent values forwarded to us  to review.  2 coronary calcification-patient denies chest pain.  Continue statin.  3 hyperlipidemia-continue statin.  4 history of cardiomyopathy-previously felt to be tachycardia mediated.  LV function is improved on most recent echocardiogram.  5 history of thoracic aortic aneurysm/dissection and subclavian-previously seen by vascular surgery medical therapy recommended.  6 prior pulmonary embolus-patient is on chronic Xarelto  for history of atrial fibrillation.  Rogue Pietro, MD

## 2023-10-20 ENCOUNTER — Encounter: Payer: Self-pay | Admitting: Cardiology

## 2023-10-20 ENCOUNTER — Ambulatory Visit (HOSPITAL_COMMUNITY)
Admission: RE | Admit: 2023-10-20 | Discharge: 2023-10-20 | Disposition: A | Discharge status: Home / Self Care | Source: Ambulatory Visit | Attending: Cardiology | Admitting: Cardiology

## 2023-10-20 ENCOUNTER — Ambulatory Visit: Attending: Cardiology | Admitting: Cardiology

## 2023-10-20 VITALS — BP 168/80 | HR 67 | Ht 68.0 in | Wt 140.0 lb

## 2023-10-20 DIAGNOSIS — E785 Hyperlipidemia, unspecified: Secondary | ICD-10-CM | POA: Diagnosis not present

## 2023-10-20 DIAGNOSIS — I480 Paroxysmal atrial fibrillation: Secondary | ICD-10-CM | POA: Diagnosis not present

## 2023-10-20 DIAGNOSIS — I2510 Atherosclerotic heart disease of native coronary artery without angina pectoris: Secondary | ICD-10-CM | POA: Diagnosis not present

## 2023-10-20 DIAGNOSIS — J8410 Pulmonary fibrosis, unspecified: Secondary | ICD-10-CM | POA: Diagnosis not present

## 2023-10-20 DIAGNOSIS — I10 Essential (primary) hypertension: Secondary | ICD-10-CM | POA: Diagnosis not present

## 2023-10-20 NOTE — Patient Instructions (Signed)
  Testing/Procedures:  A chest x-ray takes a picture of the organs and structures inside the chest, including the heart, lungs, and blood vessels. This test can show several things, including, whether the heart is enlarges; whether fluid is building up in the lungs; and whether pacemaker / defibrillator leads are still in place. [address]  Follow-Up: At [facility], you and your health needs are our priority.  As part of our continuing mission to provide you with exceptional heart care, our providers are all part of one team.  This team includes your primary Cardiologist (physician) and Advanced Practice Providers or APPs (Physician Assistants and Nurse Practitioners) who all work together to provide you with the care you need, when you need it.  Your next appointment:   6 month(s)  Provider:   Redell Shallow, MD

## 2023-10-25 ENCOUNTER — Ambulatory Visit: Payer: Self-pay | Admitting: Cardiology

## 2023-10-27 DIAGNOSIS — N390 Urinary tract infection, site not specified: Secondary | ICD-10-CM | POA: Diagnosis not present

## 2023-10-27 DIAGNOSIS — E039 Hypothyroidism, unspecified: Secondary | ICD-10-CM | POA: Diagnosis not present

## 2023-10-27 DIAGNOSIS — Z23 Encounter for immunization: Secondary | ICD-10-CM | POA: Diagnosis not present

## 2023-10-27 DIAGNOSIS — R350 Frequency of micturition: Secondary | ICD-10-CM | POA: Diagnosis not present

## 2023-11-12 DIAGNOSIS — M8580 Other specified disorders of bone density and structure, unspecified site: Secondary | ICD-10-CM | POA: Diagnosis not present

## 2023-11-12 DIAGNOSIS — D6949 Other primary thrombocytopenia: Secondary | ICD-10-CM | POA: Diagnosis not present

## 2023-11-12 DIAGNOSIS — Z833 Family history of diabetes mellitus: Secondary | ICD-10-CM | POA: Diagnosis not present

## 2023-11-12 DIAGNOSIS — I4891 Unspecified atrial fibrillation: Secondary | ICD-10-CM | POA: Diagnosis not present

## 2023-11-12 DIAGNOSIS — I429 Cardiomyopathy, unspecified: Secondary | ICD-10-CM | POA: Diagnosis not present

## 2023-11-12 DIAGNOSIS — I25119 Atherosclerotic heart disease of native coronary artery with unspecified angina pectoris: Secondary | ICD-10-CM | POA: Diagnosis not present

## 2023-11-12 DIAGNOSIS — Z85828 Personal history of other malignant neoplasm of skin: Secondary | ICD-10-CM | POA: Diagnosis not present

## 2023-11-12 DIAGNOSIS — E039 Hypothyroidism, unspecified: Secondary | ICD-10-CM | POA: Diagnosis not present

## 2023-11-12 DIAGNOSIS — Z8249 Family history of ischemic heart disease and other diseases of the circulatory system: Secondary | ICD-10-CM | POA: Diagnosis not present

## 2023-11-12 DIAGNOSIS — F325 Major depressive disorder, single episode, in full remission: Secondary | ICD-10-CM | POA: Diagnosis not present

## 2023-11-12 DIAGNOSIS — Z809 Family history of malignant neoplasm, unspecified: Secondary | ICD-10-CM | POA: Diagnosis not present

## 2023-11-12 DIAGNOSIS — Z7901 Long term (current) use of anticoagulants: Secondary | ICD-10-CM | POA: Diagnosis not present

## 2023-11-12 DIAGNOSIS — I719 Aortic aneurysm of unspecified site, without rupture: Secondary | ICD-10-CM | POA: Diagnosis not present

## 2023-11-12 DIAGNOSIS — Z8616 Personal history of COVID-19: Secondary | ICD-10-CM | POA: Diagnosis not present

## 2023-11-12 DIAGNOSIS — K5900 Constipation, unspecified: Secondary | ICD-10-CM | POA: Diagnosis not present

## 2023-11-12 DIAGNOSIS — Z79890 Hormone replacement therapy (postmenopausal): Secondary | ICD-10-CM | POA: Diagnosis not present

## 2023-11-12 DIAGNOSIS — Z86711 Personal history of pulmonary embolism: Secondary | ICD-10-CM | POA: Diagnosis not present

## 2023-11-12 DIAGNOSIS — M1990 Unspecified osteoarthritis, unspecified site: Secondary | ICD-10-CM | POA: Diagnosis not present

## 2023-11-12 DIAGNOSIS — I110 Hypertensive heart disease with heart failure: Secondary | ICD-10-CM | POA: Diagnosis not present

## 2023-11-12 DIAGNOSIS — I509 Heart failure, unspecified: Secondary | ICD-10-CM | POA: Diagnosis not present

## 2023-11-12 DIAGNOSIS — D6869 Other thrombophilia: Secondary | ICD-10-CM | POA: Diagnosis not present

## 2023-11-12 DIAGNOSIS — Z86006 Personal history of melanoma in-situ: Secondary | ICD-10-CM | POA: Diagnosis not present

## 2023-11-12 DIAGNOSIS — J8410 Pulmonary fibrosis, unspecified: Secondary | ICD-10-CM | POA: Diagnosis not present

## 2023-11-14 ENCOUNTER — Telehealth (HOSPITAL_BASED_OUTPATIENT_CLINIC_OR_DEPARTMENT_OTHER): Payer: Self-pay | Admitting: Family

## 2023-11-14 NOTE — Telephone Encounter (Signed)
 Mychart message sent to patient  Richie Adrien ORN, RN to Carlie Solorzano    11/01/23  7:04 AM If she wants to DC amiodarone  I'm ok with that but she should understand there will be higher chance of recurrent atrial fibrillation. Redell Shallow  Last read by Bonney Ebb Blunt at 10:23AM on 11/01/2023.

## 2023-11-14 NOTE — Telephone Encounter (Signed)
  Per Mychart scheduling message:   Dr. Pietro is aware and suggested I contact you since you prescribed the medication Amiodarone , either March or April 2024. I've always had a lot of side affects, but they are getting worse and just wondered if I could ease off this medicine and could prescribe something less invasive.

## 2023-12-28 ENCOUNTER — Other Ambulatory Visit (HOSPITAL_BASED_OUTPATIENT_CLINIC_OR_DEPARTMENT_OTHER): Payer: Self-pay | Admitting: Family
